# Patient Record
Sex: Male | Born: 1955 | Race: White | Hispanic: No | Marital: Married | Smoking: Never smoker
Health system: Southern US, Community
[De-identification: ages and names within clinical notes are randomized; demographics above are authoritative.]

## PROBLEM LIST (undated history)

## (undated) DIAGNOSIS — E559 Vitamin D deficiency, unspecified: Secondary | ICD-10-CM

## (undated) DIAGNOSIS — R011 Cardiac murmur, unspecified: Secondary | ICD-10-CM

## (undated) DIAGNOSIS — K589 Irritable bowel syndrome without diarrhea: Secondary | ICD-10-CM

## (undated) DIAGNOSIS — M6281 Muscle weakness (generalized): Secondary | ICD-10-CM

## (undated) DIAGNOSIS — Z8659 Personal history of other mental and behavioral disorders: Secondary | ICD-10-CM

## (undated) DIAGNOSIS — J189 Pneumonia, unspecified organism: Secondary | ICD-10-CM

## (undated) DIAGNOSIS — M549 Dorsalgia, unspecified: Secondary | ICD-10-CM

## (undated) DIAGNOSIS — I499 Cardiac arrhythmia, unspecified: Secondary | ICD-10-CM

## (undated) DIAGNOSIS — I240 Acute coronary thrombosis not resulting in myocardial infarction: Secondary | ICD-10-CM

## (undated) DIAGNOSIS — K219 Gastro-esophageal reflux disease without esophagitis: Secondary | ICD-10-CM

## (undated) DIAGNOSIS — E785 Hyperlipidemia, unspecified: Secondary | ICD-10-CM

## (undated) DIAGNOSIS — R0602 Shortness of breath: Secondary | ICD-10-CM

## (undated) DIAGNOSIS — Z8719 Personal history of other diseases of the digestive system: Secondary | ICD-10-CM

## (undated) DIAGNOSIS — R5383 Other fatigue: Secondary | ICD-10-CM

## (undated) DIAGNOSIS — M1990 Unspecified osteoarthritis, unspecified site: Secondary | ICD-10-CM

## (undated) DIAGNOSIS — M2550 Pain in unspecified joint: Secondary | ICD-10-CM

## (undated) DIAGNOSIS — C61 Malignant neoplasm of prostate: Secondary | ICD-10-CM

## (undated) DIAGNOSIS — H9190 Unspecified hearing loss, unspecified ear: Secondary | ICD-10-CM

## (undated) DIAGNOSIS — I10 Essential (primary) hypertension: Secondary | ICD-10-CM

## (undated) DIAGNOSIS — F419 Anxiety disorder, unspecified: Secondary | ICD-10-CM

## (undated) DIAGNOSIS — K5900 Constipation, unspecified: Secondary | ICD-10-CM

## (undated) DIAGNOSIS — R399 Unspecified symptoms and signs involving the genitourinary system: Secondary | ICD-10-CM

## (undated) DIAGNOSIS — D649 Anemia, unspecified: Secondary | ICD-10-CM

## (undated) HISTORY — DX: Shortness of breath: R06.02

## (undated) HISTORY — PX: HERNIA REPAIR: SHX51

## (undated) HISTORY — PX: HEMORROIDECTOMY: SUR656

## (undated) HISTORY — DX: Unspecified hearing loss, unspecified ear: H91.90

## (undated) HISTORY — PX: VASECTOMY: SHX75

## (undated) HISTORY — PX: OTHER SURGICAL HISTORY: SHX169

## (undated) HISTORY — DX: Vitamin D deficiency, unspecified: E55.9

## (undated) HISTORY — DX: Dorsalgia, unspecified: M54.9

## (undated) HISTORY — PX: CYST EXCISION: SHX5701

## (undated) HISTORY — DX: Constipation, unspecified: K59.00

## (undated) HISTORY — DX: Other fatigue: R53.83

## (undated) HISTORY — DX: Pain in unspecified joint: M25.50

## (undated) HISTORY — PX: LASIK: SHX215

## (undated) HISTORY — DX: Irritable bowel syndrome, unspecified: K58.9

## (undated) HISTORY — PX: SKIN TAG REMOVAL: SHX780

## (undated) HISTORY — DX: Acute coronary thrombosis not resulting in myocardial infarction: I24.0

## (undated) HISTORY — DX: Muscle weakness (generalized): M62.81

---

## 1999-06-07 ENCOUNTER — Encounter: Payer: Self-pay | Admitting: *Deleted

## 1999-06-07 ENCOUNTER — Ambulatory Visit (HOSPITAL_COMMUNITY): Admission: RE | Admit: 1999-06-07 | Discharge: 1999-06-07 | Payer: Self-pay | Admitting: *Deleted

## 2000-07-22 ENCOUNTER — Encounter: Payer: Self-pay | Admitting: Urology

## 2000-07-26 ENCOUNTER — Encounter (INDEPENDENT_AMBULATORY_CARE_PROVIDER_SITE_OTHER): Payer: Self-pay

## 2000-07-26 ENCOUNTER — Ambulatory Visit (HOSPITAL_COMMUNITY): Admission: RE | Admit: 2000-07-26 | Discharge: 2000-07-26 | Payer: Self-pay | Admitting: Urology

## 2001-07-22 ENCOUNTER — Encounter: Payer: Self-pay | Admitting: Internal Medicine

## 2001-07-22 ENCOUNTER — Encounter: Admission: RE | Admit: 2001-07-22 | Discharge: 2001-07-22 | Payer: Self-pay | Admitting: Internal Medicine

## 2005-06-26 ENCOUNTER — Ambulatory Visit (HOSPITAL_BASED_OUTPATIENT_CLINIC_OR_DEPARTMENT_OTHER): Admission: RE | Admit: 2005-06-26 | Discharge: 2005-06-26 | Payer: Self-pay | Admitting: Urology

## 2005-06-26 ENCOUNTER — Encounter (INDEPENDENT_AMBULATORY_CARE_PROVIDER_SITE_OTHER): Payer: Self-pay | Admitting: Specialist

## 2005-07-10 ENCOUNTER — Ambulatory Visit (HOSPITAL_COMMUNITY): Admission: AD | Admit: 2005-07-10 | Discharge: 2005-07-10 | Payer: Self-pay | Admitting: Urology

## 2009-05-05 ENCOUNTER — Emergency Department (HOSPITAL_BASED_OUTPATIENT_CLINIC_OR_DEPARTMENT_OTHER): Admission: EM | Admit: 2009-05-05 | Discharge: 2009-05-05 | Payer: Self-pay | Admitting: Emergency Medicine

## 2009-05-05 ENCOUNTER — Ambulatory Visit: Payer: Self-pay | Admitting: Diagnostic Radiology

## 2010-06-06 LAB — COMPREHENSIVE METABOLIC PANEL
ALT: 63 U/L — ABNORMAL HIGH (ref 0–53)
BUN: 45 mg/dL — ABNORMAL HIGH (ref 6–23)
CO2: 29 mEq/L (ref 19–32)
Calcium: 10.2 mg/dL (ref 8.4–10.5)
Creatinine, Ser: 1.9 mg/dL — ABNORMAL HIGH (ref 0.4–1.5)
GFR calc non Af Amer: 37 mL/min — ABNORMAL LOW (ref >60)
Glucose, Bld: 110 mg/dL — ABNORMAL HIGH (ref 70–99)

## 2010-06-06 LAB — URINALYSIS, ROUTINE W REFLEX MICROSCOPIC
Ketones, ur: 15 mg/dL — AB
Nitrite: NEGATIVE
pH: 5.5 (ref 5.0–8.0)

## 2010-06-06 LAB — CBC
HCT: 47.2 % (ref 39.0–52.0)
Hemoglobin: 16 g/dL (ref 13.0–17.0)
MCHC: 34 g/dL (ref 30.0–36.0)
MCV: 88.5 fL (ref 78.0–100.0)
Platelets: 200 10*3/uL (ref 150–400)
RBC: 5.34 MIL/uL (ref 4.22–5.81)
RDW: 12.6 % (ref 11.5–15.5)
WBC: 7.2 10*3/uL (ref 4.0–10.5)

## 2010-06-06 LAB — POCT CARDIAC MARKERS
CKMB, poc: 3.7 ng/mL (ref 1.0–8.0)
CKMB, poc: 5.5 ng/mL (ref 1.0–8.0)
Troponin i, poc: <0.05 ng/mL (ref 0.00–0.09)

## 2010-06-06 LAB — DIFFERENTIAL
Eosinophils Absolute: 0.1 10*3/uL (ref 0.0–0.7)
Lymphocytes Relative: 27 % (ref 12–46)
Lymphs Abs: 1.9 10*3/uL (ref 0.7–4.0)
Neutro Abs: 4.2 10*3/uL (ref 1.7–7.7)
Neutrophils Relative %: 59 % (ref 43–77)

## 2010-06-06 LAB — D-DIMER, QUANTITATIVE: D-Dimer, Quant: <0.22 ug/mL-FEU (ref 0.00–0.48)

## 2010-08-01 NOTE — Op Note (Signed)
NAME:  Thomas [name], Thomas [name]               ACCOUNT NO.:  [id number]   MEDICAL RECORD NO.:  [id number]          PATIENT TYPE:  AMB   LOCATION:  DAY                          FACILITY:  [facility]   PHYSICIAN:  Sigmund I. Patsi Sears, M.D.DATE OF BIRTH:  May 21, 1955   DATE OF PROCEDURE:  07/10/2005  DATE OF DISCHARGE:                                 OPERATIVE REPORT   PREOPERATIVE DIAGNOSIS:  Left scrotal hematoma.   POSTOPERATIVE DIAGNOSIS:  Left scrotal hematoma.   OPERATION:  Incision and drainage of left scrotal hematoma.   SURGEON:  Sigmund I. Patsi Sears, M.D.   ANESTHESIA:  General LMA.   PREPARATION:  Appropriate preanesthesia, the patient is brought to the  operating room and placed on the operating room table in the dorsal supine  position where general LMA anesthesia was induced.  He was then replaced in  the dorsal lithotomy position where the pubis was prepped with Betadine  solution and draped in the usual fashion.   REVIEW OF THE HISTORY:  This 55 year old male is status post left scrotal  expiration, excision of multiloculated left epididymal cyst, with postop of  scrotal hematoma.  The hematoma has begun to drain through the incision site  and is quite uncomfortable.  I recommended incision and drainage of the  scrotal hematoma today.   PROCEDURE:  The previous left scrotal incision is reopened, subcutaneous  tissue dissected, and a large amount of old blood clot is evacuated from the  wound.  The wound is irrigated with approximately one liter of saline  solution in a pulsating fashion, and following complete evacuation of  hematoma, the testicle was replaced in the wound, and a 3/4 inch Penrose  drain is placed through the inferior portion of scrotum.  The wound was  closed in one layer with 3-0 Vicryl suture.  A scrotal fluff dressing was  placed.  The patient received IV Ancef.  He is awakened and taken to the  recovery room in good condition.      Sigmund I.  Patsi Sears, M.D.  Electronically Signed     SIT/MEDQ  D:  07/10/2005  T:  07/11/2005  Job:  [id number]

## 2010-08-01 NOTE — Op Note (Signed)
NAME:  Thomas [name], Thomas [name]               ACCOUNT NO.:  [id number]   MEDICAL RECORD NO.:  [id number]          PATIENT TYPE:  AMB   LOCATION:  [facility]                         FACILITY:  [facility]   PHYSICIAN:  Sigmund I. Patsi Sears, M.D.DATE OF BIRTH:  [birth year]   DATE OF PROCEDURE:  06/26/2005  DATE OF DISCHARGE:                                 OPERATIVE REPORT   PREOPERATIVE DIAGNOSIS:  Left spermatocele.   POSTOPERATIVE DIAGNOSIS:  Left spermatocele.   OPERATION:  Left scrotal exploration, excision of large, multiloculated  epididymal cyst (spermatocele).   SURGEON:  Sigmund I. Patsi Sears, M.D.   ANESTHESIA:  General LMA.   PREPARATION:  After appropriate anesthesia, the patient was brought to the  operating room, placed on the operating table in dorsal supine position  where the scrotum was shaved, prepped with Betadine solution, draped in the  usual fashion.  The left inner thigh was previously marked twice to denote  the proper side.   PROCEDURE:  The previous left hemiscrotal incision was identified and  incised.  Subcutaneous tissue was dissected with the electrosurgical unit.  The testicle was delivered into the wound, and careful blunt and sharp  dissection was accomplished, in order to avoid injury to the vascular supply  to the testicle.  A large multiloculated cyst was identified, and this was  carefully dissected.  It was removed completely and drainage of the cyst was  accomplished at the very end of the dissection yielding approximately 50-75  mL of clear straw-colored fluid.  There was no mass within the cystic area.  The wound was inspected for bleeding points, the testicle re-delivered into  the wound, and a drain placed through the inferior portion of the left  hemiscrotum, and sutured in place with a 4-0 Monocryl suture.  The testicle  was delivered into the wound, and the wound closed in two layers with 3-0  Vicryl suture.  The wound edges were anesthetized with 0.25%  Marcaine  solution plain.  The patient was awakened, taken to recovery room in good  condition.  He received IV antibiotics in the procedure.      Sigmund I. Patsi Sears, M.D.  Electronically Signed     SIT/MEDQ  D:  06/26/2005  T:  06/26/2005  Job:  [id number]

## 2010-08-01 NOTE — Op Note (Signed)
[facility]  Patient:    Thomas [name], Thomas [name]                      MRN: [id number] Proc. Date: 07/26/00 Adm. Date:  [id number] Attending:  Laqueta Jean                           Operative Report  PREOPERATIVE DIAGNOSIS.  Left epididymal cyst, bilateral elective sterilization.  POSTOPERATIVE DIAGNOSIS:  Left epididymal cyst, bilateral elective sterilization.  OPERATION:  Left epididymal cyst excision, partial epididymectomy, bilateral vasectomy.  SURGEON:  Sigmund I. Patsi Sears, M.D.  ANESTHESIA:  General (LMA).  PREPARATION:  After appropriate preanesthesia, the patient was brought to the operating room, placed on the operating table in the dorsal supine position where general LMA anesthesia was introduced.  He remained in this position where the scrotum was washed with Betadine solution, then shaven, prepped with Betadine solution, and draped in the usual fashion.  DESCRIPTION OF PROCEDURE:  A left hemiscrotal incision was made in the horizontal position, measuring 3-4 cm.  Subcutaneous tissue was dissected with the electrosurgical unit.  The testicle was delivered in the wound, and the appendix epididymis and the appendix testis are removed with the electrosurgical unit.  A left epidermal cyst is identified, arising from the caput of the epididymis.  This required dissection with the electrosurgical unit and partial amputation of the epididymis, ligated with 2-0 Vicryl suture. This was oversewn with 3-0 Vicryl suture.  Vasectomy was accomplished by excising a portion of the vas, cauterizing both sides, ligating with 3-0 Vicryl suture bilaterally.  This is oversewn as well with 3-0 Vicryl suture. The testicle is delivered in the wound after injecting the spermatic cord with 0.25% plain Marcaine.  The skin edges were also injected with 0.25% plain Marcaine.  The subcutaneous tissue was closed with running 3-0 Vicryl suture, and the skin was  closed with 4-0 Vicryl suture.  The right-sided vas was then identified subcutaneously, and a 1 cm incision was made over the vas and subcutaneous tissue dissected with the electrosurgical unit.  The vas is delivered in the wound and clamped and amputated as well.  The ends are cauterized, and each end is ligated with 3-0 Vicryl suture.  The vas is oversewn with 3-0 Vicryl suture, and the subcutaneous tissue was closed with 3-0 Vicryl suture.  The area of the incision was injected with 0.25% plain Marcaine, as well as the ends of the vas.  Following this, the skin was closed with three separate 4-0 Vicryl sutures. Sterile dressing was applied.  The patient was given IV Toradol, B&O suppository, Zofran to combat nausea, awakened, and taken to the recovery room in good condition. DD:  07/26/00 TD:  07/26/00 Job: [id number]

## 2011-11-02 ENCOUNTER — Emergency Department (HOSPITAL_COMMUNITY)
Admission: EM | Admit: 2011-11-02 | Discharge: 2011-11-03 | Disposition: A | Discharge status: Home / Self Care | Payer: No Typology Code available for payment source | Attending: Emergency Medicine | Admitting: Emergency Medicine

## 2011-11-02 ENCOUNTER — Encounter (HOSPITAL_COMMUNITY): Payer: Self-pay | Admitting: *Deleted

## 2011-11-02 ENCOUNTER — Emergency Department (HOSPITAL_COMMUNITY): Payer: No Typology Code available for payment source

## 2011-11-02 DIAGNOSIS — R10819 Abdominal tenderness, unspecified site: Secondary | ICD-10-CM

## 2011-11-02 DIAGNOSIS — Y998 Other external cause status: Secondary | ICD-10-CM | POA: Insufficient documentation

## 2011-11-02 DIAGNOSIS — I10 Essential (primary) hypertension: Secondary | ICD-10-CM | POA: Insufficient documentation

## 2011-11-02 DIAGNOSIS — Y93I9 Activity, other involving external motion: Secondary | ICD-10-CM | POA: Insufficient documentation

## 2011-11-02 DIAGNOSIS — R51 Headache: Secondary | ICD-10-CM | POA: Insufficient documentation

## 2011-11-02 DIAGNOSIS — S139XXA Sprain of joints and ligaments of unspecified parts of neck, initial encounter: Secondary | ICD-10-CM | POA: Insufficient documentation

## 2011-11-02 DIAGNOSIS — S161XXA Strain of muscle, fascia and tendon at neck level, initial encounter: Secondary | ICD-10-CM

## 2011-11-02 HISTORY — DX: Essential (primary) hypertension: I10
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
## 2011-11-03 MED ORDER — CYCLOBENZAPRINE HCL 10 MG PO TABS
10.0000 mg | ORAL_TABLET | Freq: Once | ORAL | Status: AC
  Administered 2011-11-03: 10 mg via ORAL
  Filled 2011-11-03: qty 1

## 2011-11-03 MED ORDER — CYCLOBENZAPRINE HCL 10 MG PO TABS: 10.0000 mg | ORAL_TABLET | Freq: Two times a day (BID) | ORAL | Status: AC | PRN
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
## 2011-11-03 NOTE — ED Provider Notes (Signed)
Medical screening examination/treatment/procedure(s) were performed by non-physician practitioner and as supervising physician I was immediately available for consultation/collaboration.  Brighid Koch M Yolander Goodie, MD 11/03/11 0848 

## 2014-03-22 DIAGNOSIS — C61 Malignant neoplasm of prostate: Secondary | ICD-10-CM

## 2014-03-22 HISTORY — PX: PROSTATE BIOPSY: SHX241

## 2014-03-22 HISTORY — DX: Malignant neoplasm of prostate: C61

## 2014-04-10 ENCOUNTER — Ambulatory Visit (INDEPENDENT_AMBULATORY_CARE_PROVIDER_SITE_OTHER): Payer: BLUE CROSS/BLUE SHIELD

## 2014-04-10 ENCOUNTER — Ambulatory Visit: Payer: BLUE CROSS/BLUE SHIELD | Admitting: Podiatry

## 2014-04-10 VITALS — BP 136/80 | HR 64 | Resp 12

## 2014-04-10 DIAGNOSIS — R52 Pain, unspecified: Secondary | ICD-10-CM

## 2014-04-10 DIAGNOSIS — M109 Gout, unspecified: Secondary | ICD-10-CM

## 2014-04-10 DIAGNOSIS — M7751 Other enthesopathy of right foot: Secondary | ICD-10-CM

## 2014-04-10 DIAGNOSIS — M1000 Idiopathic gout, unspecified site: Secondary | ICD-10-CM

## 2014-04-10 MED ORDER — MELOXICAM 15 MG PO TABS: 15.0000 mg | ORAL_TABLET | Freq: Every day | ORAL | Status: DC

## 2014-04-10 NOTE — Progress Notes (Signed)
   Subjective:    Patient ID: Thomas [name], male    DOB: November 30, 1955, 59 y.o.   MRN: [id number]  HPI PT STATED RT GREAT TOE HAVE A KNOT AND BEEN HURTING FOR 2 MONTHS. THE TOE IS GETTING WORSE AND THE KNOT IS GETTING BIGGER.THE TOE GET AGGRAVATED BY FLEXING AND TRIED NO TREATMENT.   Review of Systems  All other systems reviewed and are negative.      Objective:   Physical Exam Lower extremity objective findings as follows is a 59 year old white male well-developed well-nourished oriented 3. Recently diagnosed with prostate cancer otherwise in no significant distress other than pain for the last couple of weeks in his right great toe distal IP joint. There is some prominence of the IP joint of the medial and dorsal no history of injury or trauma pain on palpation and attempted range of motion although is gotten better and the last week or so. Lower extremity objective findings reveal vascular status to be intact pedal pulses are palpable DP and PT +2 over 4 bilateral Refill time 3 seconds all digits epicritic and proprioceptive sensations intact and symmetric bilateral there is normal plantar response DTRs not listed dermatologic the skin color pigment normal hair growth absent nails somewhat criptotic and incurvated otherwise unremarkable orthopedic exam rectus foot type ankle metatarsal subtalar joint motions normal hallux IP joints somewhat prominent on the right as opposed to the left x-rays reveal some soft tissue swelling possibly some mild erosive changes of the middle proximal phalanx head or medial proximal phalanx head on the right great toe. Again no history of injury trauma the case scenario is red painful tender to touch although somewhat improved still slightly tender on palpation and range of motion. Patient is on a thiazide diuretic which can elicit gout attacks.       Assessment & Plan:  Assessment capsulitis first toe for right great toe IP joint cannot rule out gouty  arthropathy which appears to be resolving. Plan at this time patient placed on a trial of meloxicam 15 g once daily also alternating warm compress ice pack to the area every evening maintain appropriate accommodative shoe of void anything tight or constrictive. Monitor recheck in 2-4 weeks if not resolved have some lab work done in the near future by his primary physician he is being tested because of his cancer suggested that they check his uric acid levels as well he will forward any information regarding findings to [place]. At this time monitor recheck in 2-4 weeks if not resolved or improved may consider other alternative treatments based on future findings or changes. Advised to keep. Warm good athletic or walking shoes and socks at all times patient however does like to go barefoot which I discouraged. Next  Harriet Masson DPM

## 2014-04-10 NOTE — Patient Instructions (Signed)

## 2014-04-12 ENCOUNTER — Other Ambulatory Visit: Payer: Self-pay | Admitting: Internal Medicine

## 2014-04-12 DIAGNOSIS — M5416 Radiculopathy, lumbar region: Secondary | ICD-10-CM

## 2014-04-16 ENCOUNTER — Encounter: Payer: Self-pay | Admitting: Radiation Oncology

## 2014-04-16 NOTE — Progress Notes (Signed)
GU Location of Tumor / Histology: Prostate, Adenocarcinoma   Thomas [name] was found to have an elevated PSA of 2.87 while on 5 alpha reductase inhibitor therapy.   Biopsies of Prostate (if applicable) revealed: Adenocarcinoma of the Prostate  If Prostate Cancer, Gleason Score is (3 + 4) and PSA is (2.87), IPSS 18, SHIM 19, Volumr =53.9cc  Biopsies of Prostate (if applicable) revealed: Adenocarcinoma of the Prostate    Biopsies of Prostate (if applicable) revealed: Adenocarcinoma of the Prostate  Past/Anticipated interventions by urology, if any: Raynelle Bring - Biopsy of the Prostate  Past/Anticipated interventions by medical oncology, if any: Unknown  Weight changes, if any:no  Bowel/Bladder complaints, if any: Lower Urinary Tract Symptoms (LUTS) - Finasteride and Tamsulosin, no hematuria , nocturia x 3, urgency, good stream   Nausea/Vomiting, if any: no  Pain issues, if any: lower  back pain,   SAFETY ISSUES:  Prior radiation? No  Pacemaker/ICD? No  Possible current pregnancy? N/A  Is the patient on methotrexate? No  Current Complaints / other details:  Elevated PSA of 2.87 while on Alpha reductase inhibitor therapy, "mild" erectile dysfunction Father hx prostate cancer now bone L-5, mother deceased 2014-06-26, Ovarian cancer June 25, 1961, Lung cancer June 26, 2010

## 2014-04-17 ENCOUNTER — Ambulatory Visit
Admission: RE | Admit: 2014-04-17 | Discharge: 2014-04-17 | Disposition: A | Discharge status: Home / Self Care | Payer: BLUE CROSS/BLUE SHIELD | Source: Ambulatory Visit | Attending: Radiation Oncology | Admitting: Radiation Oncology

## 2014-04-17 ENCOUNTER — Encounter: Payer: Self-pay | Admitting: Radiation Oncology

## 2014-04-17 VITALS — BP 140/84 | HR 58 | Temp 97.8°F | Resp 20 | Ht 71.0 in | Wt 297.4 lb

## 2014-04-17 DIAGNOSIS — F419 Anxiety disorder, unspecified: Secondary | ICD-10-CM | POA: Insufficient documentation

## 2014-04-17 DIAGNOSIS — Z8042 Family history of malignant neoplasm of prostate: Secondary | ICD-10-CM | POA: Insufficient documentation

## 2014-04-17 DIAGNOSIS — Z791 Long term (current) use of non-steroidal anti-inflammatories (NSAID): Secondary | ICD-10-CM | POA: Diagnosis not present

## 2014-04-17 DIAGNOSIS — I10 Essential (primary) hypertension: Secondary | ICD-10-CM | POA: Insufficient documentation

## 2014-04-17 DIAGNOSIS — C61 Malignant neoplasm of prostate: Secondary | ICD-10-CM | POA: Insufficient documentation

## 2014-04-17 HISTORY — DX: Malignant neoplasm of prostate: C61

## 2014-04-17 HISTORY — DX: Unspecified symptoms and signs involving the genitourinary system: R39.9

## 2014-04-17 HISTORY — DX: Personal history of other mental and behavioral disorders: Z86.59

## 2014-04-17 HISTORY — DX: Hyperlipidemia, unspecified: E78.5

## 2014-04-17 NOTE — Progress Notes (Signed)
Please see the Nurse Progress Note in the MD Initial Consult Encounter for this patient. 

## 2014-04-17 NOTE — Progress Notes (Signed)
[facility] NEW PATIENT EVALUATION  Name: OREL [name] MRN: [id number]  Date:   04/17/2014           DOB: 1956/01/24  Status: outpatient   CC: Henrine Screws, MD  Dr. Dutch Gray   REFERRING PHYSICIAN: Dr. 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 male who is seen today through the courtesy of Dr. Alinda Money for evaluation of his stage TIc intermediate risk adenocarcinoma prostate.  He was noted to have an elevated PSA of 2.87 while on finasteride.  He was sent to Dr. Alinda Money for further evaluation.  He underwent ultrasound-guided biopsies on 03/22/2014.  He was found have Gleason 7 (3+4) involving 10% of one core from the right base (pattern 4 =10%) and Gleason 6 (3+3) involving 60% of one core from the right lateral base.  His gland volume was approximately 54 mL.  He's had difficulty with obstructive symptomatology and his I PSS score was 18 while on finasteride and tamsulosin.  He was given samples of Rapaflo which did improve his obstructive symptoms to a noticeable degree.  His  IPSS score today is 11.  He is potent.  No GI difficulties.  PREVIOUS RADIATION THERAPY: No   PAST MEDICAL HISTORY:  has a past medical history of Hypertension; History of anxiety; Hyperlipemia; Lower urinary tract symptoms (LUTS); and Prostate cancer (03/22/14).     PAST SURGICAL HISTORY:  Past Surgical History  Procedure Laterality Date  . Hernia repair Bilateral     open inguinal hernia repairs  . Prostate biopsy  03/22/14    Raynelle [name]  . Hemorroidectomy    . Incision and drainage of wound      Wound Infection Post OP  . Skin tag removal      15 lesions  . Epididymis excision Left     Spermocele  . Vasectomy       FAMILY HISTORY: family history includes COPD in his mother; Cancer in his mother; Heart attack in his mother; Heart failure in his mother; Ovarian cancer in  his mother. His father was diagnosed with prostate cancer at age 17.  His mother died from kidney failure but had a history of early stage lung cancer and also ovarian cancer.   SOCIAL HISTORY:  reports that he has never smoked. He does not have any smokeless tobacco history on file. He reports that he does not drink alcohol or use illicit drugs.  Married, 2 children.  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   ALLERGIES: Review of patient's allergies indicates no known allergies.   MEDICATIONS:  Current Outpatient Prescriptions  Medication Sig Dispense Refill  . Cholecalciferol (VITAMIN D3) 2000 UNITS TABS Take 2,000 Units by mouth daily.    Marland Kitchen escitalopram (LEXAPRO) 10 MG tablet Take 10 mg by mouth daily.  5  . finasteride (PROSCAR) 5 MG tablet Take 5 mg by mouth daily.  2  . meloxicam (MOBIC) 15 MG tablet Take 1 tablet (15 mg total) by mouth daily. 30 tablet 1  . methocarbamol (ROBAXIN) 500 MG tablet Take 500 mg by mouth every 8 (eight) hours as needed for muscle spasms.    . Tamsulosin HCl (FLOMAX) 0.4 MG CAPS Take 0.4 mg by mouth daily.    . valsartan-hydrochlorothiazide (DIOVAN-HCT) 320-25 MG per tablet Take 1 tablet by mouth daily.  12  . Multiple Vitamin (MULTIVITAMIN WITH MINERALS) TABS Take  1 tablet by mouth daily.     No current facility-administered medications for this encounter.     REVIEW OF SYSTEMS:  Pertinent items are noted in HPI.    PHYSICAL EXAM:  height is 5\' 11"  (1.803 m) and weight is 297 lb 6.4 oz (134.9 kg). His oral temperature is 97.8 F (36.6 C). His blood pressure is 140/84 and his pulse is 58. His respiration is 20.   Alert and oriented 59 year old white male appearing his stated age.  He is not examined today.   LABORATORY DATA:  Lab Results  Component Value Date   WBC 7.2 05/05/2009   HGB 16.0 05/05/2009   HCT 47.2 05/05/2009   MCV 88.5 05/05/2009   PLT 200 05/05/2009   Lab Results  Component Value Date   NA 138 05/05/2009   K 3.4*  05/05/2009   CL 98 05/05/2009   CO2 29 05/05/2009   Lab Results  Component Value Date   ALT 63* 05/05/2009   AST 53* 05/05/2009   ALKPHOS 90 05/05/2009   BILITOT 1.0 05/05/2009   PSA 2.87 (corrected 5.8) from 02/21/2014   IMPRESSION: Stage TIc reviewed risk adenocarcinoma prostate.  I explained to the patient and his wife that his prognosis is related to his stage, Gleason score, and PSA level.  His stage and PSA level are favorable while his Gleason score of 7 is of intermediate favorability.  Other prognostic factors include PSA doubling time and disease volume, and these appear to be favorable.  We discussed surgery versus active surveillance versus radiation therapy.  In view of his age I do not recommend active surveillance with a Gleason 7.  Radiation therapy options include seed implantation with or without 5 weeks of external beam versus 8 weeks of external beam/IMRT.  We discussed the potential acute and late toxicities of both forms of radiation therapy.  Considering his obstructive symptomatology he would not be a candidate for seed implantation with or without external beam radiation therapy.  Even with external beam/IMRT, his urinary symptomatology can only worsen, and he may actually have better urinary function following surgery.  Because of his young age and obstructive urinary symptomatology, I strongly recommend robotic surgery as the treatment of choice for him.  He chooses robotic surgery.   PLAN: As discussed above.   I spent 60 minutes minutes face to face with the patient and more than 50% of that time was spent in counseling and/or coordination of care.

## 2014-04-25 ENCOUNTER — Ambulatory Visit
Admission: RE | Admit: 2014-04-25 | Discharge: 2014-04-25 | Disposition: A | Discharge status: Home / Self Care | Payer: BLUE CROSS/BLUE SHIELD | Source: Ambulatory Visit | Attending: Internal Medicine | Admitting: Internal Medicine

## 2014-04-25 DIAGNOSIS — M5416 Radiculopathy, lumbar region: Secondary | ICD-10-CM

## 2014-05-01 ENCOUNTER — Other Ambulatory Visit: Payer: Self-pay | Admitting: Urology

## 2014-05-02 NOTE — Patient Instructions (Addendum)
Thomas [name]  05/02/2014   Your procedure is scheduled on:05/17/2014   Report to [facility] Main  Entrance and follow signs to               [place] at      [place] AM.  Call this number if you have problems the morning of surgery [phone]   Remember:  Do not eat food or drink liquids :After Midnight.     Take these medicines the morning of surgery with A SIP OF WATER: none                                You may not have any metal on your body including hair pins and              piercings  Do not wear jewelry,  lotions, powders or perfumes., deodorant.                     Men may shave face and neck.   Do not bring valuables to the hospital. [place].  Contacts, dentures or bridgework may not be worn into surgery.  Leave suitcase in the car. After surgery it may be brought to your room.        Special Instructions:coughing and deep breathing exercises, leg exercises               Please read over the following fact sheets you were given: _____________________________________________________________________             [facility] - Preparing for Surgery Before surgery, you can play an important role.  Because skin is not sterile, your skin needs to be as free of germs as possible.  You can reduce the number of germs on your skin by washing with CHG (chlorahexidine gluconate) soap before surgery.  CHG is an antiseptic cleaner which kills germs and bonds with the skin to continue killing germs even after washing. Please DO NOT use if you have an allergy to CHG or antibacterial soaps.  If your skin becomes reddened/irritated stop using the CHG and inform your nurse when you arrive at Short Stay. Do not shave (including legs and underarms) for at least 48 hours prior to the first CHG shower.  You may shave your face/neck. Please follow these instructions carefully:  1.  Shower with CHG Soap the  night before surgery and the  morning of Surgery.  2.  If you choose to wash your hair, wash your hair first as usual with your  normal  shampoo.  3.  After you shampoo, rinse your hair and body thoroughly to remove the  shampoo.                           4.  Use CHG as you would any other liquid soap.  You can apply chg directly  to the skin and wash                       Gently with a scrungie or clean washcloth.  5.  Apply the CHG Soap to your body ONLY FROM THE NECK DOWN.   Do not use on  face/ open                           Wound or open sores. Avoid contact with eyes, ears mouth and genitals (private parts).                       Wash face,  Genitals (private parts) with your normal soap.             6.  Wash thoroughly, paying special attention to the area where your surgery  will be performed.  7.  Thoroughly rinse your body with warm water from the neck down.  8.  DO NOT shower/wash with your normal soap after using and rinsing off  the CHG Soap.                9.  Pat yourself dry with a clean towel.            10.  Wear clean pajamas.            11.  Place clean sheets on your bed the night of your first shower and do not  sleep with pets. Day of Surgery : Do not apply any lotions/deodorants the morning of surgery.  Please wear clean clothes to the hospital/surgery center.  FAILURE TO FOLLOW THESE INSTRUCTIONS MAY RESULT IN THE CANCELLATION OF YOUR SURGERY PATIENT SIGNATURE_________________________________  NURSE SIGNATURE__________________________________  ________________________________________________________________________  WHAT IS A BLOOD TRANSFUSION? Blood Transfusion Information  A transfusion is the replacement of blood or some of its parts. Blood is made up of multiple cells which provide different functions.  Red blood cells carry oxygen and are used for blood loss replacement.  White blood cells fight against infection.  Platelets control bleeding.  Plasma  helps clot blood.  Other blood products are available for specialized needs, such as hemophilia or other clotting disorders. BEFORE THE TRANSFUSION  Who gives blood for transfusions?   Healthy volunteers who are fully evaluated to make sure their blood is safe. This is blood bank blood. Transfusion therapy is the safest it has ever been in the practice of medicine. Before blood is taken from a donor, a complete history is taken to make sure that person has no history of diseases nor engages in risky social behavior (examples are intravenous drug use or sexual activity with multiple partners). The donor's travel history is screened to minimize risk of transmitting infections, such as malaria. The donated blood is tested for signs of infectious diseases, such as HIV and hepatitis. The blood is then tested to be sure it is compatible with you in order to minimize the chance of a transfusion reaction. If you or a relative donates blood, this is often done in anticipation of surgery and is not appropriate for emergency situations. It takes many days to process the donated blood. RISKS AND COMPLICATIONS Although transfusion therapy is very safe and saves many lives, the main dangers of transfusion include:  1. Getting an infectious disease. 2. Developing a transfusion reaction. This is an allergic reaction to something in the blood you were given. Every precaution is taken to prevent this. The decision to have a blood transfusion has been considered carefully by your caregiver before blood is given. Blood is not given unless the benefits outweigh the risks. AFTER THE TRANSFUSION  Right after receiving a blood transfusion, you will usually feel much better and more energetic. This is especially true if your red  blood cells have gotten low (anemic). The transfusion raises the level of the red blood cells which carry oxygen, and this usually causes an energy increase.  The nurse administering the transfusion  will monitor you carefully for complications. HOME CARE INSTRUCTIONS  No special instructions are needed after a transfusion. You may find your energy is better. Speak with your caregiver about any limitations on activity for underlying diseases you may have. SEEK MEDICAL CARE IF:   Your condition is not improving after your transfusion.  You develop redness or irritation at the intravenous (IV) site. SEEK IMMEDIATE MEDICAL CARE IF:  Any of the following symptoms occur over the next 12 hours:  Shaking chills.  You have a temperature by mouth above 102 F (38.9 C), not controlled by medicine.  Chest, back, or muscle pain.  People around you feel you are not acting correctly or are confused.  Shortness of breath or difficulty breathing.  Dizziness and fainting.  You get a rash or develop hives.  You have a decrease in urine output.  Your urine turns a dark color or changes to pink, red, or brown. Any of the following symptoms occur over the next 10 days:  You have a temperature by mouth above 102 F (38.9 C), not controlled by medicine.  Shortness of breath.  Weakness after normal activity.  The white part of the eye turns yellow (jaundice).  You have a decrease in the amount of urine or are urinating less often.  Your urine turns a dark color or changes to pink, red, or brown. Document Released: 02/28/2000 Document Revised: 05/25/2011 Document Reviewed: 10/17/2007 ExitCare Patient Information 2014 [place].  _______________________________________________________________________  Incentive Spirometer  An incentive spirometer is a tool that can help keep your lungs clear and active. This tool measures how well you are filling your lungs with each breath. Taking long deep breaths may help reverse or decrease the chance of developing breathing (pulmonary) problems (especially infection) following:  A long period of time when you are unable to move or be  active. BEFORE THE PROCEDURE   If the spirometer includes an indicator to show your best effort, your nurse or respiratory therapist will set it to a desired goal.  If possible, sit up straight or lean slightly forward. Try not to slouch.  Hold the incentive spirometer in an upright position. INSTRUCTIONS FOR USE  3. Sit on the edge of your bed if possible, or sit up as far as you can in bed or on a chair. 4. Hold the incentive spirometer in an upright position. 5. Breathe out normally. 6. Place the mouthpiece in your mouth and seal your lips tightly around it. 7. Breathe in slowly and as deeply as possible, raising the piston or the ball toward the top of the column. 8. Hold your breath for 3-5 seconds or for as long as possible. Allow the piston or ball to fall to the bottom of the column. 9. Remove the mouthpiece from your mouth and breathe out normally. 10. Rest for a few seconds and repeat Steps 1 through 7 at least 10 times every 1-2 hours when you are awake. Take your time and take a few normal breaths between deep breaths. 11. The spirometer may include an indicator to show your best effort. Use the indicator as a goal to work toward during each repetition. 12. After each set of 10 deep breaths, practice coughing to be sure your lungs are clear. If you have an incision (the cut  made at the time of surgery), support your incision when coughing by placing a pillow or rolled up towels firmly against it. Once you are able to get out of bed, walk around indoors and cough well. You may stop using the incentive spirometer when instructed by your caregiver.  RISKS AND COMPLICATIONS  Take your time so you do not get dizzy or light-headed.  If you are in pain, you may need to take or ask for pain medication before doing incentive spirometry. It is harder to take a deep breath if you are having pain. AFTER USE  Rest and breathe slowly and easily.  It can be helpful to keep track of a log of  your progress. Your caregiver can provide you with a simple table to help with this. If you are using the spirometer at home, follow these instructions: [place] IF:   You are having difficultly using the spirometer.  You have trouble using the spirometer as often as instructed.  Your pain medication is not giving enough relief while using the spirometer.  You develop fever of 100.5 F (38.1 C) or higher. SEEK IMMEDIATE MEDICAL CARE IF:   You cough up bloody sputum that had not been present before.  You develop fever of 102 F (38.9 C) or greater.  You develop worsening pain at or near the incision site. MAKE SURE YOU:   Understand these instructions.  Will watch your condition.  Will get help right away if you are not doing well or get worse. Document Released: 07/13/2006 Document Revised: 05/25/2011 Document Reviewed: 09/13/2006 [facility] Patient Information 2014 [place], [place].   ________________________________________________________________________

## 2014-05-03 ENCOUNTER — Encounter (HOSPITAL_COMMUNITY)
Admission: RE | Admit: 2014-05-03 | Discharge: 2014-05-03 | Disposition: A | Discharge status: Home / Self Care | Payer: BLUE CROSS/BLUE SHIELD | Source: Ambulatory Visit | Attending: Urology | Admitting: Urology

## 2014-05-03 ENCOUNTER — Ambulatory Visit (HOSPITAL_COMMUNITY)
Admission: RE | Admit: 2014-05-03 | Discharge: 2014-05-03 | Disposition: A | Discharge status: Home / Self Care | Payer: BLUE CROSS/BLUE SHIELD | Source: Ambulatory Visit | Attending: Urology | Admitting: Urology

## 2014-05-03 ENCOUNTER — Encounter (HOSPITAL_COMMUNITY): Payer: Self-pay

## 2014-05-03 ENCOUNTER — Other Ambulatory Visit: Payer: Self-pay

## 2014-05-03 DIAGNOSIS — C61 Malignant neoplasm of prostate: Secondary | ICD-10-CM | POA: Diagnosis not present

## 2014-05-03 DIAGNOSIS — I10 Essential (primary) hypertension: Secondary | ICD-10-CM | POA: Insufficient documentation

## 2014-05-03 DIAGNOSIS — Z01818 Encounter for other preprocedural examination: Secondary | ICD-10-CM | POA: Diagnosis present

## 2014-05-03 HISTORY — DX: Pneumonia, unspecified organism: J18.9

## 2014-05-03 HISTORY — DX: Anemia, unspecified: D64.9

## 2014-05-03 HISTORY — DX: Anxiety disorder, unspecified: F41.9

## 2014-05-03 HISTORY — DX: Unspecified osteoarthritis, unspecified site: M19.90

## 2014-05-03 LAB — CBC
HEMATOCRIT: 48.7 % (ref 39.0–52.0)
Hemoglobin: 16 g/dL (ref 13.0–17.0)
MCH: 29.6 pg (ref 26.0–34.0)
MCHC: 32.9 g/dL (ref 30.0–36.0)
MCV: 90 fL (ref 78.0–100.0)
Platelets: 171 10*3/uL (ref 150–400)
RBC: 5.41 MIL/uL (ref 4.22–5.81)
RDW: 13 % (ref 11.5–15.5)
WBC: 5.8 10*3/uL (ref 4.0–10.5)

## 2014-05-03 LAB — BASIC METABOLIC PANEL
Anion gap: 6 (ref 5–15)
BUN: 19 mg/dL (ref 6–23)
CO2: 31 mmol/L (ref 19–32)
CREATININE: 1.07 mg/dL (ref 0.50–1.35)
Calcium: 9.7 mg/dL (ref 8.4–10.5)
Chloride: 104 mmol/L (ref 96–112)
GFR calc Af Amer: 87 mL/min — ABNORMAL LOW (ref >90)
GFR calc non Af Amer: 75 mL/min — ABNORMAL LOW (ref >90)
GLUCOSE: 99 mg/dL (ref 70–99)
POTASSIUM: 4.7 mmol/L (ref 3.5–5.1)
Sodium: 141 mmol/L (ref 135–145)

## 2014-05-16 MED ORDER — DEXTROSE 5 % IV SOLN
3.0000 g | INTRAVENOUS | Status: AC
  Administered 2014-05-17: 3 g via INTRAVENOUS
  Filled 2014-05-16 (×3): qty 3000

## 2014-05-16 NOTE — H&P (Signed)
History of Present Illness Thomas [name] is 59 years old with prostate cancer. He was found to have an elevated PSA of 2.87 while on 5 alpha reductase inhibitor therapy. He underwent a prostate biopsy on 03/22/14 and this demonstrated Gleason 3+4=7 adenocarcinoma with 2 out of 12 biopsy cores positive for malignancy. He has a paternal family history of prostate cancer. He is very healthy overall. His past surgical history is significant for bilateral open inguinal hernia repairs.    TNM stage: cT1c Nx Mx  PSA: 2.87 (on 5 ARI)  Gleason score: 3+4=7  Biopsy (03/22/14): 2/12 cores positive -- R base (10%, 3+4=7), R lateral base (60%, 3+3=6)  Prostate volume: 53.9 cc    Nomogram  OC disease: 64%  EPE: 36%  SVI: 1%  LNI: 1%  PFS (surgery): 95% at 5 years, 90% at 10 years    Urinary function: He does have significant LUTS. IPSS is 18 on combination medical therapy including finasteride and tamsulosin.  Erectile function: He has mild erectile dysfunction. SHIM score is 19.    Interval history:    Thomas [name] returns today after having seen Dr. Valere Dross for a radiation oncology consultation. He has decided to proceed with surgical therapy for treatment of his prostate cancer.   Past Medical History Problems  1. History of Anxiety (F41.9) 2. History of hyperlipidemia (Z86.39) 3. History of hypertension (Z86.79)  Surgical History Problems  1. History of Hemorrhoidectomy 2. History of Hernia Repair 3. History of Incision And Drainage Postoperative Wound Infection, Complex 4. History of Skin Tag Removal Up To 15 Lesions 5. History of Surgery Epididymis Excision Of Spermatocele Left 6. History of Surgery Of Male Genitalia Vasectomy  Current Meds 1. Diovan HCT 320-25 MG Oral Tablet;  Therapy: (Recorded:09Dec2015) to Recorded 2. Escitalopram Oxalate 10 MG Oral Tablet;  Therapy: (Recorded:09Dec2015) to Recorded 3. Finasteride 5 MG Oral Tablet;  Therapy:  (Recorded:09Dec2015) to Recorded 4. Rapaflo 8 MG Oral Capsule; TAKE 1 CAPSULE Bedtime;  Therapy: 26Jan2016 to (Evaluate:20Jan2017)  Requested for: 26Jan2016; Last  Rx:26Jan2016 Ordered 5. Tamsulosin HCl - 0.4 MG Oral Capsule;  Therapy: (Recorded:09Dec2015) to Recorded  Allergies Medication  1. No Known Drug Allergies  Family History Problems  1. Family history of Acute Myocardial Infarction : Mother 2. Family history of Cancer : Mother 3. Family history of Family Health Status Number Of Children   2 sons 4. Family history of chronic obstructive pulmonary disease (Z82.5) : Mother 5. Family history of lung cancer (Z80.1) : Mother 6. Family history of ovarian cancer (Z80.41) : Mother 7. Family history of prostate cancer (Z80.42) : Father 8. Family history of Hysterectomy : Mother 23. Family history of Knee Replacement : Father  Social History Problems  1. Denied: Alcohol Use 2. Marital History - Currently Married 3. Never a smoker 4. Occupation:   Press photographer 5. Denied: History of Tobacco Use  Vitals Vital Signs [Data Includes: Last 1 Day]  Recorded: [id number] 08:06AM  Blood Pressure: 117 / 74 Heart Rate: 80  Physical Exam Constitutional: Well nourished and well developed . No acute distress.  ENT:. The ears and nose are normal in appearance.  Neck: The appearance of the neck is normal and no neck mass is present.  Pulmonary: No respiratory distress, normal respiratory rhythm and effort and clear bilateral breath sounds.  Cardiovascular: Heart rate and rhythm are normal . No peripheral edema.  Abdomen: The abdomen is obese. The abdomen is soft and nontender. No CVA tenderness.  Neuro/Psych:. Mood and affect are appropriate.  Plan Prostate cancer  1. Follow-up Schedule Surgery Office  Follow-up  Status: Complete  Done: [id number] 2. PT/OT Referral Referral  Referral  Status: Hold For - Appointment,PreCert,Date of  Service,Physical Therapy  Requested for:  [id number]  Discussion/Summary 1. Prostate cancer: Thomas [name] feels very well informed after his discussion with Dr. Valere Dross and has elected to proceed with surgical therapy and will be scheduled for a bilateral nerve sparing robot-assisted laparoscopic radical prostatectomy and bilateral pelvic lymphadenectomy.   We discussed surgical therapy for prostate cancer including the different available surgical approaches. We discussed, in detail, the risks and expectations of surgery with regard to cancer control, urinary control, and erectile function as well as the expected postoperative recovery process. Additional risks of surgery including but not limited to bleeding, infection, hernia formation, nerve damage, lymphocele formation, bowel/rectal injury potentially necessitating colostomy, damage to the urinary tract resulting in urine leakage, urethral stricture, and the cardiopulmonary risks such as myocardial infarction, stroke, death, venothromboembolism, etc. were explained. The risk of open surgical conversion for robotic/laparoscopic prostatectomy was also discussed.     All questions were answered to his stated satisfaction. His surgery will be scheduled for the near future.    Cc: Dr. Arloa Koh  Dr. Mertha Finders  A total of 45 minutes were spent in the overall care of the patient today with 40 minutes in direct face to face consultation.    Signatures Electronically signed by : Raynelle Bring, M.D.; May 01 2014  8:52AM EST

## 2014-05-17 ENCOUNTER — Inpatient Hospital Stay (HOSPITAL_COMMUNITY): Payer: BLUE CROSS/BLUE SHIELD | Admitting: Anesthesiology

## 2014-05-17 ENCOUNTER — Inpatient Hospital Stay (HOSPITAL_COMMUNITY)
Admission: RE | Admit: 2014-05-17 | Discharge: 2014-05-18 | DRG: 708 | Disposition: A | Discharge status: Home / Self Care | Payer: BLUE CROSS/BLUE SHIELD | Source: Ambulatory Visit | Attending: Urology | Admitting: Urology

## 2014-05-17 ENCOUNTER — Encounter (HOSPITAL_COMMUNITY): Payer: Self-pay | Admitting: *Deleted

## 2014-05-17 ENCOUNTER — Encounter (HOSPITAL_COMMUNITY)
Admission: RE | Disposition: A | Discharge status: Home / Self Care | Payer: Self-pay | Source: Ambulatory Visit | Attending: Urology

## 2014-05-17 DIAGNOSIS — I10 Essential (primary) hypertension: Secondary | ICD-10-CM | POA: Diagnosis present

## 2014-05-17 DIAGNOSIS — Z8249 Family history of ischemic heart disease and other diseases of the circulatory system: Secondary | ICD-10-CM

## 2014-05-17 DIAGNOSIS — E785 Hyperlipidemia, unspecified: Secondary | ICD-10-CM | POA: Diagnosis present

## 2014-05-17 DIAGNOSIS — Z8041 Family history of malignant neoplasm of ovary: Secondary | ICD-10-CM | POA: Diagnosis not present

## 2014-05-17 DIAGNOSIS — Z8042 Family history of malignant neoplasm of prostate: Secondary | ICD-10-CM | POA: Diagnosis not present

## 2014-05-17 DIAGNOSIS — C61 Malignant neoplasm of prostate: Principal | ICD-10-CM | POA: Diagnosis present

## 2014-05-17 DIAGNOSIS — N529 Male erectile dysfunction, unspecified: Secondary | ICD-10-CM | POA: Diagnosis present

## 2014-05-17 DIAGNOSIS — Z801 Family history of malignant neoplasm of trachea, bronchus and lung: Secondary | ICD-10-CM | POA: Diagnosis not present

## 2014-05-17 HISTORY — PX: ROBOT ASSISTED LAPAROSCOPIC RADICAL PROSTATECTOMY: SHX5141

## 2014-05-17 HISTORY — PX: LYMPHADENECTOMY: SHX5960

## 2014-05-17 LAB — TYPE AND SCREEN
ABO/RH(D): O POS
Antibody Screen: NEGATIVE

## 2014-05-17 LAB — ABO/RH: ABO/RH(D): O POS

## 2014-05-17 LAB — HEMOGLOBIN AND HEMATOCRIT, BLOOD
HCT: 44.8 % (ref 39.0–52.0)
Hemoglobin: 14.9 g/dL (ref 13.0–17.0)

## 2014-05-17 SURGERY — ROBOTIC ASSISTED LAPAROSCOPIC RADICAL PROSTATECTOMY LEVEL 2
Anesthesia: General

## 2014-05-17 MED ORDER — DIPHENHYDRAMINE HCL 12.5 MG/5ML PO ELIX: 12.5000 mg | ORAL_SOLUTION | Freq: Four times a day (QID) | ORAL | Status: DC | PRN

## 2014-05-17 MED ORDER — DIPHENHYDRAMINE HCL 50 MG/ML IJ SOLN: 12.5000 mg | Freq: Four times a day (QID) | INTRAMUSCULAR | Status: DC | PRN

## 2014-05-17 MED ORDER — PROPOFOL 10 MG/ML IV BOLUS
INTRAVENOUS | Status: DC | PRN
Start: 2014-05-17 — End: 2014-05-17
  Administered 2014-05-17: 180 mg via INTRAVENOUS

## 2014-05-17 MED ORDER — DEXAMETHASONE SODIUM PHOSPHATE 10 MG/ML IJ SOLN
INTRAMUSCULAR | Status: DC | PRN
  Administered 2014-05-17: 10 mg via INTRAVENOUS

## 2014-05-17 MED ORDER — MORPHINE SULFATE 2 MG/ML IJ SOLN
2.0000 mg | INTRAMUSCULAR | Status: DC | PRN
  Administered 2014-05-17: 2 mg via INTRAVENOUS
  Filled 2014-05-17: qty 1

## 2014-05-17 MED ORDER — PROPOFOL 10 MG/ML IV BOLUS
INTRAVENOUS | Status: AC
  Filled 2014-05-17: qty 20

## 2014-05-17 MED ORDER — HYDROMORPHONE HCL 1 MG/ML IJ SOLN
0.2500 mg | INTRAMUSCULAR | Status: DC | PRN
  Administered 2014-05-17 (×2): 0.5 mg via INTRAVENOUS

## 2014-05-17 MED ORDER — HYDROCODONE-ACETAMINOPHEN 5-325 MG PO TABS: 1.0000 | ORAL_TABLET | Freq: Four times a day (QID) | ORAL | Status: DC | PRN

## 2014-05-17 MED ORDER — LACTATED RINGERS IV SOLN: INTRAVENOUS | Status: DC

## 2014-05-17 MED ORDER — SUCCINYLCHOLINE CHLORIDE 20 MG/ML IJ SOLN
INTRAMUSCULAR | Status: DC | PRN
  Administered 2014-05-17: 140 mg via INTRAVENOUS

## 2014-05-17 MED ORDER — BUPIVACAINE-EPINEPHRINE 0.25% -1:200000 IJ SOLN
INTRAMUSCULAR | Status: DC | PRN
  Administered 2014-05-17: 30 mL

## 2014-05-17 MED ORDER — CEFAZOLIN SODIUM 1-5 GM-% IV SOLN
1.0000 g | Freq: Three times a day (TID) | INTRAVENOUS | Status: AC
  Administered 2014-05-17 – 2014-05-18 (×2): 1 g via INTRAVENOUS
  Filled 2014-05-17 (×2): qty 50

## 2014-05-17 MED ORDER — ROCURONIUM BROMIDE 100 MG/10ML IV SOLN
INTRAVENOUS | Status: AC
  Filled 2014-05-17: qty 1

## 2014-05-17 MED ORDER — FENTANYL CITRATE 0.05 MG/ML IJ SOLN
INTRAMUSCULAR | Status: AC
  Filled 2014-05-17: qty 5

## 2014-05-17 MED ORDER — NEOSTIGMINE METHYLSULFATE 10 MG/10ML IV SOLN
INTRAVENOUS | Status: AC
  Filled 2014-05-17: qty 1

## 2014-05-17 MED ORDER — ROCURONIUM BROMIDE 100 MG/10ML IV SOLN
INTRAVENOUS | Status: DC | PRN
  Administered 2014-05-17: 10 mg via INTRAVENOUS
  Administered 2014-05-17: 35 mg via INTRAVENOUS
  Administered 2014-05-17: 20 mg via INTRAVENOUS
  Administered 2014-05-17 (×2): 10 mg via INTRAVENOUS
  Administered 2014-05-17: 5 mg via INTRAVENOUS

## 2014-05-17 MED ORDER — ONDANSETRON HCL 4 MG/2ML IJ SOLN
INTRAMUSCULAR | Status: AC
  Filled 2014-05-17: qty 2

## 2014-05-17 MED ORDER — NEOSTIGMINE METHYLSULFATE 10 MG/10ML IV SOLN
INTRAVENOUS | Status: DC | PRN
  Administered 2014-05-17: 4 mg via INTRAVENOUS

## 2014-05-17 MED ORDER — BUPIVACAINE-EPINEPHRINE (PF) 0.25% -1:200000 IJ SOLN
INTRAMUSCULAR | Status: AC
  Filled 2014-05-17: qty 30

## 2014-05-17 MED ORDER — ONDANSETRON HCL 4 MG/2ML IJ SOLN
INTRAMUSCULAR | Status: DC | PRN
  Administered 2014-05-17: 4 mg via INTRAVENOUS

## 2014-05-17 MED ORDER — LACTATED RINGERS IV SOLN
INTRAVENOUS | Status: DC | PRN
  Administered 2014-05-17: 1000 mL

## 2014-05-17 MED ORDER — KETOROLAC TROMETHAMINE 15 MG/ML IJ SOLN
15.0000 mg | Freq: Four times a day (QID) | INTRAMUSCULAR | Status: DC
  Administered 2014-05-17 – 2014-05-18 (×5): 15 mg via INTRAVENOUS
  Filled 2014-05-17 (×5): qty 1

## 2014-05-17 MED ORDER — SODIUM CHLORIDE 0.9 % IR SOLN
Status: DC | PRN
  Administered 2014-05-17: 1000 mL via INTRAVESICAL

## 2014-05-17 MED ORDER — GLYCOPYRROLATE 0.2 MG/ML IJ SOLN
INTRAMUSCULAR | Status: AC
  Filled 2014-05-17: qty 2

## 2014-05-17 MED ORDER — PHENYLEPHRINE HCL 10 MG/ML IJ SOLN
INTRAMUSCULAR | Status: DC | PRN
  Administered 2014-05-17 (×5): 40 ug via INTRAVENOUS

## 2014-05-17 MED ORDER — DEXAMETHASONE SODIUM PHOSPHATE 10 MG/ML IJ SOLN
INTRAMUSCULAR | Status: AC
  Filled 2014-05-17: qty 1

## 2014-05-17 MED ORDER — ESCITALOPRAM OXALATE 10 MG PO TABS
10.0000 mg | ORAL_TABLET | Freq: Every day | ORAL | Status: DC
Start: 2014-05-17 — End: 2014-05-18
  Administered 2014-05-17: 10 mg via ORAL
  Filled 2014-05-17 (×2): qty 1

## 2014-05-17 MED ORDER — DOCUSATE SODIUM 100 MG PO CAPS
100.0000 mg | ORAL_CAPSULE | Freq: Two times a day (BID) | ORAL | Status: DC
  Administered 2014-05-17 – 2014-05-18 (×2): 100 mg via ORAL
  Filled 2014-05-17 (×2): qty 1

## 2014-05-17 MED ORDER — FENTANYL CITRATE 0.05 MG/ML IJ SOLN
INTRAMUSCULAR | Status: DC | PRN
  Administered 2014-05-17: 50 ug via INTRAVENOUS
  Administered 2014-05-17: 100 ug via INTRAVENOUS
  Administered 2014-05-17 (×2): 50 ug via INTRAVENOUS

## 2014-05-17 MED ORDER — ACETAMINOPHEN 325 MG PO TABS
650.0000 mg | ORAL_TABLET | ORAL | Status: DC | PRN
  Administered 2014-05-17: 650 mg via ORAL
  Filled 2014-05-17: qty 2

## 2014-05-17 MED ORDER — MIDAZOLAM HCL 2 MG/2ML IJ SOLN
INTRAMUSCULAR | Status: AC
  Filled 2014-05-17: qty 2

## 2014-05-17 MED ORDER — IRBESARTAN 300 MG PO TABS
300.0000 mg | ORAL_TABLET | Freq: Every day | ORAL | Status: DC
  Administered 2014-05-17: 300 mg via ORAL
  Filled 2014-05-17: qty 1

## 2014-05-17 MED ORDER — SODIUM CHLORIDE 0.9 % IV BOLUS (SEPSIS)
1000.0000 mL | Freq: Once | INTRAVENOUS | Status: AC
  Administered 2014-05-17: 1000 mL via INTRAVENOUS

## 2014-05-17 MED ORDER — LACTATED RINGERS IV SOLN
INTRAVENOUS | Status: DC | PRN
  Administered 2014-05-17 (×2): via INTRAVENOUS

## 2014-05-17 MED ORDER — HYDROMORPHONE HCL 1 MG/ML IJ SOLN
INTRAMUSCULAR | Status: AC
  Filled 2014-05-17: qty 1

## 2014-05-17 MED ORDER — HYDROCHLOROTHIAZIDE 25 MG PO TABS
25.0000 mg | ORAL_TABLET | Freq: Every day | ORAL | Status: DC
  Administered 2014-05-17: 25 mg via ORAL
  Filled 2014-05-17: qty 1

## 2014-05-17 MED ORDER — CIPROFLOXACIN HCL 500 MG PO TABS: 500.0000 mg | ORAL_TABLET | Freq: Two times a day (BID) | ORAL | Status: DC

## 2014-05-17 MED ORDER — HEPARIN SODIUM (PORCINE) 1000 UNIT/ML IJ SOLN
INTRAMUSCULAR | Status: AC
  Filled 2014-05-17: qty 1

## 2014-05-17 MED ORDER — ONDANSETRON HCL 4 MG/2ML IJ SOLN: 4.0000 mg | Freq: Once | INTRAMUSCULAR | Status: DC | PRN

## 2014-05-17 MED ORDER — GLYCOPYRROLATE 0.2 MG/ML IJ SOLN
INTRAMUSCULAR | Status: DC | PRN
  Administered 2014-05-17: 0.4 mg via INTRAVENOUS
  Administered 2014-05-17: 0.6 mg via INTRAVENOUS

## 2014-05-17 MED ORDER — MIDAZOLAM HCL 5 MG/5ML IJ SOLN
INTRAMUSCULAR | Status: DC | PRN
  Administered 2014-05-17: 2 mg via INTRAVENOUS

## 2014-05-17 MED ORDER — LIDOCAINE HCL (CARDIAC) 20 MG/ML IV SOLN
INTRAVENOUS | Status: DC | PRN
  Administered 2014-05-17: 50 mg via INTRAVENOUS

## 2014-05-17 MED ORDER — GLYCOPYRROLATE 0.2 MG/ML IJ SOLN
INTRAMUSCULAR | Status: AC
  Filled 2014-05-17: qty 4

## 2014-05-17 MED ORDER — VALSARTAN-HYDROCHLOROTHIAZIDE 320-25 MG PO TABS: 1.0000 | ORAL_TABLET | Freq: Every day | ORAL | Status: DC

## 2014-05-17 MED ORDER — KETOROLAC TROMETHAMINE 15 MG/ML IJ SOLN
INTRAMUSCULAR | Status: AC
  Filled 2014-05-17: qty 1

## 2014-05-17 MED ORDER — HYDROMORPHONE HCL 1 MG/ML IJ SOLN
0.2500 mg | INTRAMUSCULAR | Status: DC | PRN
  Administered 2014-05-17 (×4): 0.5 mg via INTRAVENOUS

## 2014-05-17 MED ORDER — LIDOCAINE HCL (CARDIAC) 20 MG/ML IV SOLN
INTRAVENOUS | Status: AC
  Filled 2014-05-17: qty 5

## 2014-05-17 MED ORDER — KCL IN DEXTROSE-NACL 20-5-0.45 MEQ/L-%-% IV SOLN
INTRAVENOUS | Status: DC
  Administered 2014-05-17 – 2014-05-18 (×3): via INTRAVENOUS
  Filled 2014-05-17 (×4): qty 1000

## 2014-05-17 SURGICAL SUPPLY — 50 items
CABLE HIGH FREQUENCY MONO STRZ (ELECTRODE) ×4 IMPLANT
CATH FOLEY 2WAY SLVR 18FR 30CC (CATHETERS) ×4 IMPLANT
CATH ROBINSON RED A/P 16FR (CATHETERS) ×4 IMPLANT
CATH ROBINSON RED A/P 8FR (CATHETERS) ×4 IMPLANT
CATH TIEMANN FOLEY 18FR 5CC (CATHETERS) ×4 IMPLANT
CHLORAPREP W/TINT 26ML (MISCELLANEOUS) ×4 IMPLANT
CLIP LIGATING HEM O LOK PURPLE (MISCELLANEOUS) ×8 IMPLANT
CLOTH BEACON ORANGE TIMEOUT ST (SAFETY) ×4 IMPLANT
COVER SURGICAL LIGHT HANDLE (MISCELLANEOUS) ×4 IMPLANT
COVER TIP SHEARS 8 DVNC (MISCELLANEOUS) ×2 IMPLANT
COVER TIP SHEARS 8MM DA VINCI (MISCELLANEOUS) ×2
CUTTER ECHEON FLEX ENDO 45 340 (ENDOMECHANICALS) ×4 IMPLANT
DECANTER SPIKE VIAL GLASS SM (MISCELLANEOUS) ×2 IMPLANT
DRAPE SURG IRRIG POUCH 19X23 (DRAPES) ×4 IMPLANT
DRSG TEGADERM 4X4.75 (GAUZE/BANDAGES/DRESSINGS) ×4 IMPLANT
DRSG TEGADERM 6X8 (GAUZE/BANDAGES/DRESSINGS) ×8 IMPLANT
ELECT REM PT RETURN 9FT ADLT (ELECTROSURGICAL) ×4
ELECTRODE REM PT RTRN 9FT ADLT (ELECTROSURGICAL) ×2 IMPLANT
GLOVE BIO SURGEON STRL SZ 6.5 (GLOVE) ×3 IMPLANT
GLOVE BIO SURGEONS STRL SZ 6.5 (GLOVE) ×1
GLOVE BIOGEL M STRL SZ7.5 (GLOVE) ×16 IMPLANT
GOWN STRL REUS W/TWL LRG LVL3 (GOWN DISPOSABLE) ×14 IMPLANT
HOLDER FOLEY CATH W/STRAP (MISCELLANEOUS) ×4 IMPLANT
IV LACTATED RINGERS 1000ML (IV SOLUTION) ×2 IMPLANT
KIT ACCESSORY DA VINCI DISP (KITS) ×2
KIT ACCESSORY DVNC DISP (KITS) ×2 IMPLANT
LIQUID BAND (GAUZE/BANDAGES/DRESSINGS) ×2 IMPLANT
MANIFOLD NEPTUNE II (INSTRUMENTS) ×4 IMPLANT
NDL SAFETY ECLIPSE 18X1.5 (NEEDLE) ×2 IMPLANT
NEEDLE HYPO 18GX1.5 SHARP (NEEDLE) ×4
PACK ROBOT UROLOGY CUSTOM (CUSTOM PROCEDURE TRAY) ×4 IMPLANT
RELOAD GREEN ECHELON 45 (STAPLE) ×4 IMPLANT
SET TUBE IRRIG SUCTION NO TIP (IRRIGATION / IRRIGATOR) ×4 IMPLANT
SHEET LAVH (DRAPES) ×2 IMPLANT
SOLUTION ELECTROLUBE (MISCELLANEOUS) ×4 IMPLANT
SUT ETHILON 3 0 PS 1 (SUTURE) ×4 IMPLANT
SUT MNCRL 3 0 RB1 (SUTURE) ×2 IMPLANT
SUT MNCRL 3 0 VIOLET RB1 (SUTURE) ×2 IMPLANT
SUT MNCRL AB 4-0 PS2 18 (SUTURE) ×8 IMPLANT
SUT MONOCRYL 3 0 RB1 (SUTURE) ×4
SUT VIC AB 0 CT1 27 (SUTURE) ×4
SUT VIC AB 0 CT1 27XBRD ANTBC (SUTURE) ×2 IMPLANT
SUT VIC AB 0 UR5 27 (SUTURE) ×4 IMPLANT
SUT VIC AB 2-0 SH 27 (SUTURE) ×8
SUT VIC AB 2-0 SH 27X BRD (SUTURE) ×2 IMPLANT
SUT VICRYL 0 UR6 27IN ABS (SUTURE) ×8 IMPLANT
SYR 27GX1/2 1ML LL SAFETY (SYRINGE) ×4 IMPLANT
TOWEL OR 17X26 10 PK STRL BLUE (TOWEL DISPOSABLE) ×4 IMPLANT
TOWEL OR NON WOVEN STRL DISP B (DISPOSABLE) ×4 IMPLANT
WATER STERILE IRR 1500ML POUR (IV SOLUTION) ×8 IMPLANT

## 2014-05-17 NOTE — Discharge Instructions (Signed)

## 2014-05-17 NOTE — Transfer of Care (Signed)
Immediate Anesthesia Transfer of Care Note  Patient: Thomas [name]  Procedure(s) Performed: Procedure(s): ROBOTIC ASSISTED LAPAROSCOPIC RADICAL PROSTATECTOMY LEVEL 2 (N/A) PELVIC LYMPHADENECTOMY (Bilateral)  Patient Location: PACU  Anesthesia Type:General  Level of Consciousness: awake, alert  and oriented  Airway & Oxygen Therapy: Patient Spontanous Breathing and Patient connected to face mask oxygen  Post-op Assessment: Report given to RN and Post -op Vital signs reviewed and stable  Post vital signs: Reviewed and stable  Last Vitals:  Filed Vitals:   05/17/14 0516  BP: 144/77  Pulse: 74  Temp: 36.7 C  Resp: 18    Complications: No apparent anesthesia complications

## 2014-05-17 NOTE — Op Note (Signed)

## 2014-05-17 NOTE — Anesthesia Preprocedure Evaluation (Signed)
Anesthesia Evaluation  Patient identified by MRN, date of birth, ID band Patient awake    Reviewed: Allergy & Precautions, Patient's Chart, lab work & pertinent test results  Airway        Dental   Pulmonary          Cardiovascular hypertension,     Neuro/Psych    GI/Hepatic   Endo/Other    Renal/GU      Musculoskeletal  (+) Arthritis -,   Abdominal   Peds  Hematology   Anesthesia Other Findings   Reproductive/Obstetrics                             Anesthesia Physical Anesthesia Plan  ASA: I  Anesthesia Plan: General   Post-op Pain Management:    Induction: Intravenous  Airway Management Planned: Oral ETT  Additional Equipment:   Intra-op Plan:   Post-operative Plan: Extubation in OR  Informed Consent: I have reviewed the patients History and Physical, chart, labs and discussed the procedure including the risks, benefits and alternatives for the proposed anesthesia with the patient or authorized representative who has indicated his/her understanding and acceptance.     Plan Discussed with: CRNA, Anesthesiologist and Surgeon  Anesthesia Plan Comments:         Anesthesia Quick Evaluation

## 2014-05-17 NOTE — Progress Notes (Signed)
Post-op note  Subjective: The patient is doing well.  No complaints.  Objective: Vital signs in last 24 hours: Temp:  [97.8 F (36.6 C)-98 F (36.7 C)] 97.8 F (36.6 C) (03/03 1204) Pulse Rate:  [59-74] 63 (03/03 1204) Resp:  [14-18] 15 (03/03 1204) BP: (98-145)/(59-78) 145/62 mmHg (03/03 1204) SpO2:  [97 %-100 %] 98 % (03/03 1204) Weight:  [135.172 kg (298 lb)] 135.172 kg (298 lb) (03/03 0525)  Intake/Output from previous day:   Intake/Output this shift: Total I/O In: 3860 [P.O.:360; I.V.:2500; IV Piggyback:1000] Out: 390 [Urine:50; Drains:40; Blood:300]  Physical Exam:  General: Alert and oriented. Abdomen: Soft, Nondistended. Incisions: Clean and dry. Urine: red  Lab Results:  Recent Labs  05/17/14 1101  HGB 14.9  HCT 44.8    Assessment/Plan: POD#0   1) Continue to monitor  2) DVT prophy, clears, IS, amb, pain control   LOS: 0 days   Thomas [name] 05/17/2014, 3:00 PM

## 2014-05-17 NOTE — Progress Notes (Signed)
Pt has ambulated on hall. Back in bed with SCDs on and ted hose. No complaints no s/s of distress noted

## 2014-05-17 NOTE — Anesthesia Postprocedure Evaluation (Signed)
  Anesthesia Post-op Note  Patient: Thomas [name]  Procedure(s) Performed: Procedure(s): ROBOTIC ASSISTED LAPAROSCOPIC RADICAL PROSTATECTOMY LEVEL 2 (N/A) PELVIC LYMPHADENECTOMY (Bilateral)  Patient Location: PACU  Anesthesia Type:General  Level of Consciousness: awake, alert , oriented and patient cooperative  Airway and Oxygen Therapy: Patient Spontanous Breathing  Post-op Pain: mild  Post-op Assessment: Post-op Vital signs reviewed, Patient's Cardiovascular Status Stable, Respiratory Function Stable, Patent Airway, No signs of Nausea or vomiting and Pain level controlled  Post-op Vital Signs: stable  Last Vitals:  Filed Vitals:   05/17/14 1204  BP: 145/62  Pulse: 63  Temp: 36.6 C  Resp: 15    Complications: No apparent anesthesia complications

## 2014-05-17 NOTE — Anesthesia Procedure Notes (Signed)
Procedure Name: Intubation Date/Time: 05/17/2014 7:27 AM Performed by: Noralyn Pick Pre-anesthesia Checklist: Patient identified, Emergency Drugs available, Suction available and Patient being monitored Patient Re-evaluated:Patient Re-evaluated prior to inductionOxygen Delivery Method: Circle System Utilized Preoxygenation: Pre-oxygenation with 100% oxygen Intubation Type: IV induction Ventilation: Mask ventilation without difficulty Laryngoscope Size: 4 Tube type: Oral Number of attempts: 1 Airway Equipment and Method: Stylet and Oral airway Placement Confirmation: ETT inserted through vocal cords under direct vision,  positive ETCO2 and breath sounds checked- equal and bilateral Secured at: 22 cm Tube secured with: Tape Dental Injury: Teeth and Oropharynx as per pre-operative assessment

## 2014-05-17 NOTE — Interval H&P Note (Signed)
History and Physical Interval Note:  05/17/2014 7:06 AM  Thomas [name]  has presented today for surgery, with the diagnosis of prostate cancer  The various methods of treatment have been discussed with the patient and family. After consideration of risks, benefits and other options for treatment, the patient has consented to  Procedure(s): ROBOTIC ASSISTED LAPAROSCOPIC RADICAL PROSTATECTOMY LEVEL 2 (N/A) PELVIC LYMPHADENECTOMY (Bilateral) as a surgical intervention .  The patient's history has been reviewed, patient examined, no change in status, stable for surgery.  I have reviewed the patient's chart and labs.  Questions were answered to the patient's satisfaction.     Anamari Galeas,LES

## 2014-05-18 ENCOUNTER — Encounter (HOSPITAL_COMMUNITY): Payer: Self-pay | Admitting: Urology

## 2014-05-18 LAB — HEMOGLOBIN AND HEMATOCRIT, BLOOD
HCT: 42.7 % (ref 39.0–52.0)
Hemoglobin: 14 g/dL (ref 13.0–17.0)

## 2014-05-18 MED ORDER — BISACODYL 10 MG RE SUPP
10.0000 mg | Freq: Once | RECTAL | Status: AC
  Administered 2014-05-18: 10 mg via RECTAL
  Filled 2014-05-18: qty 1

## 2014-05-18 MED ORDER — HYDROCODONE-ACETAMINOPHEN 5-325 MG PO TABS: 1.0000 | ORAL_TABLET | Freq: Four times a day (QID) | ORAL | Status: DC | PRN

## 2014-05-18 NOTE — Discharge Summary (Signed)
  Date of admission: 05/17/2014  Date of discharge: 05/18/2014  Admission diagnosis: Prostate Cancer  Discharge diagnosis: Prostate Cancer  History and Physical: For full details, please see admission history and physical. Briefly, Thomas [name] is a 59 y.o. gentleman with localized prostate cancer.  After discussing management/treatment options, he elected to proceed with surgical treatment.  Hospital Course: Thomas [name] was taken to the operating room on 05/17/2014 and underwent a robotic assisted laparoscopic radical prostatectomy. He tolerated this procedure well and without complications. Postoperatively, he was able to be transferred to a regular hospital room following recovery from anesthesia.  He was able to begin ambulating the night of surgery. He remained hemodynamically stable overnight.  He had excellent urine output with appropriately minimal output from his pelvic drain and his pelvic drain was removed on POD #1.  He was transitioned to oral pain medication, tolerated a clear liquid diet, and had met all discharge criteria and was able to be discharged home later on POD#1.  Laboratory values:  Recent Labs  05/17/14 1101 05/18/14 0415  HGB 14.9 14.0  HCT 44.8 42.7    Disposition: Home  Discharge instruction: He was instructed to be ambulatory but to refrain from heavy lifting, strenuous activity, or driving. He was instructed on urethral catheter care.  Discharge medications:     Medication List    STOP taking these medications        finasteride 5 MG tablet  Commonly known as:  PROSCAR     ibuprofen 200 MG tablet  Commonly known as:  ADVIL,MOTRIN     meloxicam 15 MG tablet  Commonly known as:  MOBIC     tamsulosin 0.4 MG Caps capsule  Commonly known as:  FLOMAX      TAKE these medications        ciprofloxacin 500 MG tablet  Commonly known as:  CIPRO  Take 1 tablet (500 mg total) by mouth 2 (two) times daily. Start day prior to office visit for foley  removal     escitalopram 10 MG tablet  Commonly known as:  LEXAPRO  Take 10 mg by mouth daily.     HYDROcodone-acetaminophen 5-325 MG per tablet  Commonly known as:  NORCO  Take 1-2 tablets by mouth every 6 (six) hours as needed.     methocarbamol 500 MG tablet  Commonly known as:  ROBAXIN  Take 500 mg by mouth every 8 (eight) hours as needed for muscle spasms.     valsartan-hydrochlorothiazide 320-25 MG per tablet  Commonly known as:  DIOVAN-HCT  Take 1 tablet by mouth at bedtime.        Followup: He will followup in 1 week for catheter removal and to discuss his surgical pathology results.

## 2014-05-18 NOTE — Care Management Note (Signed)
    Page 1 of 1   05/18/2014     12:58:11 PM CARE MANAGEMENT NOTE 05/18/2014  Patient:  SHAYMUS, EVELETH   Account Number:  [id number]  Date Initiated:  05/18/2014  Documentation initiated by:  Dessa Phi  Subjective/Objective Assessment:   59 y/o m admitted w/Prostate Ca.     Action/Plan:   From home.   Anticipated DC Date:  05/19/2014   Anticipated DC Plan:  [place]  CM consult      Choice offered to / List presented to:             Status of service:  Completed, signed off Medicare Important Message given?   (If response is "NO", the following Medicare IM given date fields will be blank) Date Medicare IM given:   Medicare IM given by:   Date Additional Medicare IM given:   Additional Medicare IM given by:    Discharge Disposition:  HOME/SELF CARE  Per UR Regulation:  Reviewed for med. necessity/level of care/duration of stay  If discussed at [place] of Stay Meetings, dates discussed:    Comments:  05/18/14 Dessa Phi RN BSN NCM [phone] POD#1 lap prostatectomy. No anticipated d/c needs.

## 2014-05-18 NOTE — Progress Notes (Signed)
Patient ID: Thomas [name], male   DOB: 02/22/1956, 59 y.o.   MRN: [id number]  1 Day Post-Op Subjective: The patient is doing well.  No nausea or vomiting. Pain is adequately controlled.  Objective: Vital signs in last 24 hours: Temp:  [97.3 F (36.3 C)-100.6 F (38.1 C)] 97.3 F (36.3 C) (03/04 0449) Pulse Rate:  [56-87] 56 (03/04 0449) Resp:  [14-18] 18 (03/04 0449) BP: (98-145)/(52-78) 128/66 mmHg (03/04 0449) SpO2:  [94 %-100 %] 96 % (03/04 0449)  Intake/Output from previous day: 03/03 0701 - 03/04 0700 In: 7295 [P.O.:1440; I.V.:4805; IV Piggyback:1050] Out: 2990 [Urine:2500; Drains:190; Blood:300] Intake/Output this shift:    Physical Exam:  General: Alert and oriented. CV: RRR Lungs: Clear bilaterally. GI: Soft, Nondistended. Incisions: Clean, dry, and intact Urine: Clear Extremities: Nontender, no erythema, no edema.  Lab Results:  Recent Labs  05/17/14 1101 05/18/14 0415  HGB 14.9 14.0  HCT 44.8 42.7      Assessment/Plan: POD# 1 s/p robotic prostatectomy.  1) SL IVF 2) Ambulate, Incentive spirometry 3) Transition to oral pain medication 4) Dulcolax suppository 5) D/C pelvic drain 6) Plan for likely discharge later today   Pryor Curia. MD   LOS: 1 day   Tytiana Coles,LES 05/18/2014, 7:34 AM

## 2014-05-24 ENCOUNTER — Encounter: Payer: Self-pay | Admitting: Radiation Oncology

## 2015-01-25 ENCOUNTER — Other Ambulatory Visit: Payer: Self-pay | Admitting: Surgery

## 2015-03-17 HISTORY — PX: INCISION AND DRAINAGE OF WOUND: SHX1803

## 2015-06-20 DIAGNOSIS — F331 Major depressive disorder, recurrent, moderate: Secondary | ICD-10-CM | POA: Diagnosis not present

## 2015-07-02 DIAGNOSIS — F331 Major depressive disorder, recurrent, moderate: Secondary | ICD-10-CM | POA: Diagnosis not present

## 2015-07-25 DIAGNOSIS — F329 Major depressive disorder, single episode, unspecified: Secondary | ICD-10-CM | POA: Diagnosis not present

## 2015-08-01 DIAGNOSIS — F331 Major depressive disorder, recurrent, moderate: Secondary | ICD-10-CM | POA: Diagnosis not present

## 2015-08-06 DIAGNOSIS — B880 Other acariasis: Secondary | ICD-10-CM | POA: Diagnosis not present

## 2015-08-14 DIAGNOSIS — F331 Major depressive disorder, recurrent, moderate: Secondary | ICD-10-CM | POA: Diagnosis not present

## 2015-08-29 DIAGNOSIS — F331 Major depressive disorder, recurrent, moderate: Secondary | ICD-10-CM | POA: Diagnosis not present

## 2015-09-03 DIAGNOSIS — F39 Unspecified mood [affective] disorder: Secondary | ICD-10-CM | POA: Diagnosis not present

## 2015-09-03 DIAGNOSIS — I10 Essential (primary) hypertension: Secondary | ICD-10-CM | POA: Diagnosis not present

## 2015-09-05 DIAGNOSIS — F331 Major depressive disorder, recurrent, moderate: Secondary | ICD-10-CM | POA: Diagnosis not present

## 2015-09-16 DIAGNOSIS — F331 Major depressive disorder, recurrent, moderate: Secondary | ICD-10-CM | POA: Diagnosis not present

## 2015-09-20 DIAGNOSIS — K432 Incisional hernia without obstruction or gangrene: Secondary | ICD-10-CM | POA: Diagnosis not present

## 2015-09-27 DIAGNOSIS — F331 Major depressive disorder, recurrent, moderate: Secondary | ICD-10-CM | POA: Diagnosis not present

## 2015-10-08 DIAGNOSIS — F331 Major depressive disorder, recurrent, moderate: Secondary | ICD-10-CM | POA: Diagnosis not present

## 2015-10-18 DIAGNOSIS — F331 Major depressive disorder, recurrent, moderate: Secondary | ICD-10-CM | POA: Diagnosis not present

## 2015-10-24 DIAGNOSIS — F329 Major depressive disorder, single episode, unspecified: Secondary | ICD-10-CM | POA: Diagnosis not present

## 2015-11-06 DIAGNOSIS — F331 Major depressive disorder, recurrent, moderate: Secondary | ICD-10-CM | POA: Diagnosis not present

## 2015-11-20 DIAGNOSIS — F331 Major depressive disorder, recurrent, moderate: Secondary | ICD-10-CM | POA: Diagnosis not present

## 2015-12-04 DIAGNOSIS — F331 Major depressive disorder, recurrent, moderate: Secondary | ICD-10-CM | POA: Diagnosis not present

## 2015-12-11 DIAGNOSIS — N5201 Erectile dysfunction due to arterial insufficiency: Secondary | ICD-10-CM | POA: Diagnosis not present

## 2015-12-11 DIAGNOSIS — C61 Malignant neoplasm of prostate: Secondary | ICD-10-CM | POA: Diagnosis not present

## 2015-12-20 DIAGNOSIS — M6208 Separation of muscle (nontraumatic), other site: Secondary | ICD-10-CM | POA: Diagnosis not present

## 2015-12-20 DIAGNOSIS — K432 Incisional hernia without obstruction or gangrene: Secondary | ICD-10-CM | POA: Diagnosis not present

## 2016-01-08 DIAGNOSIS — F331 Major depressive disorder, recurrent, moderate: Secondary | ICD-10-CM | POA: Diagnosis not present

## 2016-01-09 DIAGNOSIS — R194 Change in bowel habit: Secondary | ICD-10-CM | POA: Diagnosis not present

## 2016-01-17 DIAGNOSIS — K5900 Constipation, unspecified: Secondary | ICD-10-CM | POA: Diagnosis not present

## 2016-01-17 DIAGNOSIS — R194 Change in bowel habit: Secondary | ICD-10-CM | POA: Diagnosis not present

## 2016-01-19 ENCOUNTER — Other Ambulatory Visit: Payer: Self-pay | Admitting: Surgery

## 2016-01-22 DIAGNOSIS — F331 Major depressive disorder, recurrent, moderate: Secondary | ICD-10-CM | POA: Diagnosis not present

## 2016-02-12 DIAGNOSIS — F331 Major depressive disorder, recurrent, moderate: Secondary | ICD-10-CM | POA: Diagnosis not present

## 2016-02-25 ENCOUNTER — Encounter (HOSPITAL_COMMUNITY): Admission: RE | Admit: 2016-02-25 | Payer: BLUE CROSS/BLUE SHIELD | Source: Ambulatory Visit

## 2016-02-27 DIAGNOSIS — F331 Major depressive disorder, recurrent, moderate: Secondary | ICD-10-CM | POA: Diagnosis not present

## 2016-03-20 DIAGNOSIS — F331 Major depressive disorder, recurrent, moderate: Secondary | ICD-10-CM | POA: Diagnosis not present

## 2016-03-20 NOTE — Progress Notes (Signed)
LOV 09/03/2015 Josetta Huddle MD [facility] in chart EKG in chart 05/06/2015

## 2016-03-20 NOTE — Patient Instructions (Addendum)
Thomas [name]  03/20/2016   Your procedure is scheduled on: 03/27/2016  Report to [facility] Main  Entrance take [facility]  elevators to 3rd floor to  [place] at 1130AM.  Call this number if you have problems the morning of surgery [phone]   Remember: ONLY 1 PERSON MAY GO WITH YOU TO SHORT STAY TO GET  READY MORNING OF [place].  Do not eat food After [place] SURGERY 03/26/2016. MAY HAVE CLEAR LIQUIDS(SEE DIET BELOW) UNTIL 7:30AM DAY  OF SURGERY 03/27/2016. AFTER 7:30AM NOTHING BY MOUTH.      CLEAR LIQUID DIET   Foods Allowed                                                                     Foods Excluded  Coffee and tea, regular and decaf                             liquids that you cannot  Plain Jell-O in any flavor                                             see through such as: Fruit ices (not with fruit pulp)                                     milk, soups, orange juice  Iced Popsicles                                    All solid food Carbonated beverages, regular and diet                                    Cranberry, grape and apple juices Sports drinks like Gatorade Lightly seasoned clear broth or consume(fat free) Sugar, honey syrup  Sample Menu Breakfast                                Lunch                                     Supper Cranberry juice                    Beef broth                            Chicken broth Jell-O                                     Grape juice  Apple juice Coffee or tea                        Jell-O                                      Popsicle                                                Coffee or tea                        Coffee or tea  _____________________________________________________________________     Take these medicines the morning of surgery with A SIP OF WATER: NONE                                You may not have any metal on your body including  hair pins and              piercings  Do not wear jewelry, make-up, lotions, powders or perfumes, deodorant             Do not wear nail polish.  Do not shave  48 hours prior to surgery.              Men may shave face and neck.   Do not bring valuables to the hospital. [place].  Contacts, dentures or bridgework may not be worn into surgery.  Leave suitcase in the car. After surgery it may be brought to your room.     Patients discharged the day of surgery will not be allowed to drive home.  Name and phone number of your driver:  Special Instructions: N/A              Please read over the following fact sheets you were given: _____________________________________________________________________             [facility] - Preparing for Surgery Before surgery, you can play an important role.  Because skin is not sterile, your skin needs to be as free of germs as possible.  You can reduce the number of germs on your skin by washing with CHG (chlorahexidine gluconate) soap before surgery.  CHG is an antiseptic cleaner which kills germs and bonds with the skin to continue killing germs even after washing. Please DO NOT use if you have an allergy to CHG or antibacterial soaps.  If your skin becomes reddened/irritated stop using the CHG and inform your nurse when you arrive at Short Stay. Do not shave (including legs and underarms) for at least 48 hours prior to the first CHG shower.  You may shave your face/neck. Please follow these instructions carefully:  1.  Shower with CHG Soap the night before surgery and the  morning of Surgery.  2.  If you choose to wash your hair, wash your hair first as usual with your  normal  shampoo.  3.  After you shampoo, rinse your hair and body thoroughly to remove the  shampoo.  4.  Use CHG as you would any other liquid soap.  You can apply chg directly  to the skin and wash                        Gently with a scrungie or clean washcloth.  5.  Apply the CHG Soap to your body ONLY FROM THE NECK DOWN.   Do not use on face/ open                           Wound or open sores. Avoid contact with eyes, ears mouth and genitals (private parts).                       Wash face,  Genitals (private parts) with your normal soap.             6.  Wash thoroughly, paying special attention to the area where your surgery  will be performed.  7.  Thoroughly rinse your body with warm water from the neck down.  8.  DO NOT shower/wash with your normal soap after using and rinsing off  the CHG Soap.                9.  Pat yourself dry with a clean towel.            10.  Wear clean pajamas.            11.  Place clean sheets on your bed the night of your first shower and do not  sleep with pets. Day of Surgery : Do not apply any lotions/deodorants the morning of surgery.  Please wear clean clothes to the hospital/surgery center.  FAILURE TO FOLLOW THESE INSTRUCTIONS MAY RESULT IN THE CANCELLATION OF YOUR SURGERY PATIENT SIGNATURE_________________________________  NURSE SIGNATURE__________________________________  ________________________________________________________________________

## 2016-03-24 ENCOUNTER — Encounter (HOSPITAL_COMMUNITY)
Admission: RE | Admit: 2016-03-24 | Discharge: 2016-03-24 | Disposition: A | Discharge status: Home / Self Care | Payer: BLUE CROSS/BLUE SHIELD | Source: Ambulatory Visit | Attending: Surgery | Admitting: Surgery

## 2016-03-24 ENCOUNTER — Encounter (HOSPITAL_COMMUNITY): Payer: Self-pay

## 2016-03-24 DIAGNOSIS — Z79899 Other long term (current) drug therapy: Secondary | ICD-10-CM | POA: Diagnosis not present

## 2016-03-24 DIAGNOSIS — Z8546 Personal history of malignant neoplasm of prostate: Secondary | ICD-10-CM | POA: Diagnosis not present

## 2016-03-24 DIAGNOSIS — K432 Incisional hernia without obstruction or gangrene: Secondary | ICD-10-CM | POA: Diagnosis not present

## 2016-03-24 DIAGNOSIS — Z01818 Encounter for other preprocedural examination: Secondary | ICD-10-CM | POA: Insufficient documentation

## 2016-03-24 DIAGNOSIS — E785 Hyperlipidemia, unspecified: Secondary | ICD-10-CM | POA: Diagnosis not present

## 2016-03-24 DIAGNOSIS — I10 Essential (primary) hypertension: Secondary | ICD-10-CM | POA: Diagnosis not present

## 2016-03-24 DIAGNOSIS — Z6841 Body Mass Index (BMI) 40.0 and over, adult: Secondary | ICD-10-CM | POA: Diagnosis not present

## 2016-03-24 DIAGNOSIS — Q7959 Other congenital malformations of abdominal wall: Secondary | ICD-10-CM | POA: Diagnosis not present

## 2016-03-24 HISTORY — DX: Gastro-esophageal reflux disease without esophagitis: K21.9

## 2016-03-24 LAB — BASIC METABOLIC PANEL
Anion gap: 7 (ref 5–15)
BUN: 19 mg/dL (ref 6–20)
CHLORIDE: 103 mmol/L (ref 101–111)
CO2: 33 mmol/L — ABNORMAL HIGH (ref 22–32)
Calcium: 9.9 mg/dL (ref 8.9–10.3)
Creatinine, Ser: 1.18 mg/dL (ref 0.61–1.24)
GFR calc Af Amer: >60 mL/min (ref >60)
GFR calc non Af Amer: >60 mL/min (ref >60)
Glucose, Bld: 96 mg/dL (ref 65–99)
POTASSIUM: 4.5 mmol/L (ref 3.5–5.1)
Sodium: 143 mmol/L (ref 135–145)

## 2016-03-24 LAB — CBC
HCT: 50.2 % (ref 39.0–52.0)
Hemoglobin: 16.9 g/dL (ref 13.0–17.0)
MCH: 29.3 pg (ref 26.0–34.0)
MCHC: 33.7 g/dL (ref 30.0–36.0)
MCV: 87 fL (ref 78.0–100.0)
Platelets: 186 10*3/uL (ref 150–400)
RBC: 5.77 MIL/uL (ref 4.22–5.81)
RDW: 13.4 % (ref 11.5–15.5)
WBC: 7.6 10*3/uL (ref 4.0–10.5)

## 2016-03-26 MED ORDER — DEXTROSE 5 % IV SOLN
3.0000 g | INTRAVENOUS | Status: AC
  Administered 2016-03-27: 3 g via INTRAVENOUS
  Filled 2016-03-26: qty 3

## 2016-03-26 NOTE — H&P (Signed)
Thomas [name]  Location: [facility] Surgery Patient #: [id number] DOB: Jan 11, 1956 Married / Language: English / Race: White Male  History of Present Illness   The patient is a 61 year old male who presents for an evaluation of a hernia.   His PCP is Dr. Brayton Layman.  He comes by himself. (But Thomas [name] was on the cell phone)  He is on for surgery for 03/27/2016. I last saw Thomas [name] in October 2017 for an abdominal wall ventral hernia. Our hope was that he lose down to 250 pounds blood before hernia repair. Unfortunately, he has gained about 5 pounds over the holidays. He had had some change in his bowels. He had a colonoscopy by dr. Cristina [name] on 01/17/2016 which was negative. He was using [employer]ystem - and he has backed off this and this has helped his bowel function. He does have a membership to a gym - 24/7 - and I talked about post op recovery. I told him no heavy lifting for 1 month.  History of abdominal wall hernia (09/2015): He comes today with an increasing incisional hernia, just above the umbilicus, from the prostate surgery by Dr. Alinda Money. He has noticed this since 2 months after surgery, but it may be a little larger or more noticeable over the last 2 months. He has no history of stomach, liver, or colon disease. He's had no nausea or change in bowel habits.  I discussed the indications and complications of hernia surgery with the patient. I discussed both the laparoscopic and open approach to hernia repair. The potential risks of hernia surgery include, but are not limited to, bleeding, infection, open surgery, nerve injury, and recurrence of the hernia.  I provided the patient literature about hernia surgery.  Past Med History: 1. Morbid obesity He is trying to lose weight. 2. HTN 3. Prostate cancer - had resection in March 2016 - is doing well Alinda Money and Valere Dross He is doing well from the prostate cancer 4.  Hyperlipideia 5. Prior hemorrhoids - managed by Dr. Modena Morrow and Dr. Jacinto Reap. Buccini. 6. Colonoscopy by dr. Cristina [name] on 01/17/2016 which was negative.  Social History: Wife, Thomas [name] - separated - but it sounds like they are getting along. They are in counseling. 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   Allergies Malachy Moan, RMA; 03/19/2016 8:45 AM) Amoxicillin *PENICILLINS*  Diarrhea. Augmentin *PENICILLINS*   Medication History Malachy Moan, RMA; 03/19/2016 8:47 AM) Escitalopram Oxalate (10MG  Tablet, Oral) Active. Valsartan-Hydrochlorothiazide (320-25MG  Tablet, Oral) Active. Simvastatin (20MG  Tablet, Oral) Active. Vitamin D (1000UNIT Tablet, Oral daily) Active. Centrum Silver 50+Men (Oral daily) Active. Medications Reconciled  Vitals Malachy Moan RMA; 03/19/2016 8:47 AM) 03/19/2016 8:47 AM Weight: 283.8 lb Height: 71in Body Surface Area: 2.45 m Body Mass Index: 39.58 kg/m  Temp.: 98.11F  Pulse: 70 (Regular)  BP: 130/80 (Sitting, Left Arm, Standard)  Physical Exam  General: obese WM alert and generally healthy appearing. HEENT: Normal. Pupils equal.  Neck: Supple. No mass. No thyroid mass. Scar on the back of his neck looks good. Lymph Nodes: No supraclavicular or cervical nodes.  Lungs: Clear to auscultation and symmetric breath sounds. Heart: RRR. No murmur or rub.  Abdomen: Soft. No mass. No tenderness. Normal bowel sounds.  He has an abdominal wall hernia just above the umbilicus, in the incision made for his prostatectomy It is a little difficult to  determine how large the fascial defect is, about 3 to 4 cm. The hernia is reducible. He has a diastasis above the hernia.  Extremities: Good strength and ROM in upper and lower extremities.  Assessment & Plan  1.  INCISIONAL HERNIA OF ANTERIOR ABDOMINAL  WALL WITHOUT OBSTRUCTION OR GANGRENE (K43.2)  Plan:   1) Surgery set for 03/28/2015  2.  DIASTASIS RECTI (M62.08)  3.  Morbid obesity - BMI 39.6 4. HTN 5. Prostate cancer - had resection in March 2016 - is doing well  Alinda Money and Valere Dross  He is doing well from the prostate cancer 6. Hyperlipideia     Alphonsa Overall, MD, [facility] Surgery Pager: [phone] Office phone:  [phone]

## 2016-03-27 ENCOUNTER — Encounter (HOSPITAL_COMMUNITY): Payer: Self-pay | Admitting: *Deleted

## 2016-03-27 ENCOUNTER — Encounter (HOSPITAL_COMMUNITY)
Admission: RE | Disposition: A | Discharge status: Home / Self Care | Payer: Self-pay | Source: Ambulatory Visit | Attending: Surgery

## 2016-03-27 ENCOUNTER — Ambulatory Visit (HOSPITAL_COMMUNITY): Payer: BLUE CROSS/BLUE SHIELD | Admitting: Anesthesiology

## 2016-03-27 ENCOUNTER — Observation Stay (HOSPITAL_COMMUNITY)
Admission: RE | Admit: 2016-03-27 | Discharge: 2016-03-28 | Disposition: A | Discharge status: Home / Self Care | Payer: BLUE CROSS/BLUE SHIELD | Source: Ambulatory Visit | Attending: Surgery | Admitting: Surgery

## 2016-03-27 DIAGNOSIS — Z8546 Personal history of malignant neoplasm of prostate: Secondary | ICD-10-CM | POA: Insufficient documentation

## 2016-03-27 DIAGNOSIS — K439 Ventral hernia without obstruction or gangrene: Secondary | ICD-10-CM | POA: Diagnosis present

## 2016-03-27 DIAGNOSIS — K432 Incisional hernia without obstruction or gangrene: Secondary | ICD-10-CM | POA: Diagnosis not present

## 2016-03-27 DIAGNOSIS — C61 Malignant neoplasm of prostate: Secondary | ICD-10-CM | POA: Diagnosis not present

## 2016-03-27 DIAGNOSIS — E785 Hyperlipidemia, unspecified: Secondary | ICD-10-CM | POA: Diagnosis not present

## 2016-03-27 DIAGNOSIS — Z79899 Other long term (current) drug therapy: Secondary | ICD-10-CM | POA: Insufficient documentation

## 2016-03-27 DIAGNOSIS — Z6841 Body Mass Index (BMI) 40.0 and over, adult: Secondary | ICD-10-CM | POA: Insufficient documentation

## 2016-03-27 DIAGNOSIS — Q7959 Other congenital malformations of abdominal wall: Secondary | ICD-10-CM | POA: Insufficient documentation

## 2016-03-27 DIAGNOSIS — I10 Essential (primary) hypertension: Secondary | ICD-10-CM | POA: Insufficient documentation

## 2016-03-27 HISTORY — DX: Ventral hernia without obstruction or gangrene: K43.9

## 2016-03-27 HISTORY — PX: VENTRAL HERNIA REPAIR: SHX424

## 2016-03-27 SURGERY — REPAIR, HERNIA, VENTRAL, LAPAROSCOPIC
Anesthesia: General

## 2016-03-27 MED ORDER — PROPOFOL 10 MG/ML IV BOLUS
INTRAVENOUS | Status: DC | PRN
  Administered 2016-03-27: 200 mg via INTRAVENOUS

## 2016-03-27 MED ORDER — ROCURONIUM BROMIDE 50 MG/5ML IV SOSY
PREFILLED_SYRINGE | INTRAVENOUS | Status: AC
  Filled 2016-03-27: qty 5

## 2016-03-27 MED ORDER — HEPARIN SODIUM (PORCINE) 5000 UNIT/ML IJ SOLN
5000.0000 [IU] | Freq: Once | INTRAMUSCULAR | Status: AC
  Administered 2016-03-27: 5000 [IU] via SUBCUTANEOUS
  Filled 2016-03-27: qty 1

## 2016-03-27 MED ORDER — LACTATED RINGERS IR SOLN
Status: DC | PRN
  Administered 2016-03-27: 1000 mL

## 2016-03-27 MED ORDER — BUPIVACAINE HCL (PF) 0.25 % IJ SOLN
INTRAMUSCULAR | Status: AC
  Filled 2016-03-27: qty 30

## 2016-03-27 MED ORDER — HEPARIN SODIUM (PORCINE) 5000 UNIT/ML IJ SOLN
5000.0000 [IU] | Freq: Three times a day (TID) | INTRAMUSCULAR | Status: DC
  Administered 2016-03-27 – 2016-03-28 (×2): 5000 [IU] via SUBCUTANEOUS
  Filled 2016-03-27 (×2): qty 1

## 2016-03-27 MED ORDER — 0.9 % SODIUM CHLORIDE (POUR BTL) OPTIME
TOPICAL | Status: DC | PRN
  Administered 2016-03-27: 1000 mL

## 2016-03-27 MED ORDER — MIDAZOLAM HCL 5 MG/5ML IJ SOLN
INTRAMUSCULAR | Status: DC | PRN
  Administered 2016-03-27: 2 mg via INTRAVENOUS

## 2016-03-27 MED ORDER — HYDROCHLOROTHIAZIDE 25 MG PO TABS
25.0000 mg | ORAL_TABLET | Freq: Every day | ORAL | Status: DC
  Administered 2016-03-27: 25 mg via ORAL
  Filled 2016-03-27: qty 1

## 2016-03-27 MED ORDER — MIDAZOLAM HCL 2 MG/2ML IJ SOLN
INTRAMUSCULAR | Status: AC
  Filled 2016-03-27: qty 2

## 2016-03-27 MED ORDER — SUGAMMADEX SODIUM 200 MG/2ML IV SOLN
INTRAVENOUS | Status: DC | PRN
  Administered 2016-03-27: 400 mg via INTRAVENOUS

## 2016-03-27 MED ORDER — PROPOFOL 10 MG/ML IV BOLUS
INTRAVENOUS | Status: AC
  Filled 2016-03-27: qty 20

## 2016-03-27 MED ORDER — KCL IN DEXTROSE-NACL 20-5-0.45 MEQ/L-%-% IV SOLN
INTRAVENOUS | Status: DC
  Administered 2016-03-27: 20:00:00 via INTRAVENOUS
  Administered 2016-03-28: 75 mL/h via INTRAVENOUS
  Filled 2016-03-27 (×2): qty 1000

## 2016-03-27 MED ORDER — SUGAMMADEX SODIUM 200 MG/2ML IV SOLN
INTRAVENOUS | Status: AC
  Filled 2016-03-27: qty 2

## 2016-03-27 MED ORDER — SUCCINYLCHOLINE CHLORIDE 200 MG/10ML IV SOSY
PREFILLED_SYRINGE | INTRAVENOUS | Status: AC
  Filled 2016-03-27: qty 10

## 2016-03-27 MED ORDER — BUPIVACAINE HCL (PF) 0.25 % IJ SOLN
INTRAMUSCULAR | Status: DC | PRN
  Administered 2016-03-27: 20 mL

## 2016-03-27 MED ORDER — FENTANYL CITRATE (PF) 250 MCG/5ML IJ SOLN
INTRAMUSCULAR | Status: AC
  Filled 2016-03-27: qty 5

## 2016-03-27 MED ORDER — ROCURONIUM BROMIDE 50 MG/5ML IV SOSY
PREFILLED_SYRINGE | INTRAVENOUS | Status: DC | PRN
  Administered 2016-03-27: 10 mg via INTRAVENOUS
  Administered 2016-03-27: 40 mg via INTRAVENOUS

## 2016-03-27 MED ORDER — HYDROMORPHONE HCL 1 MG/ML IJ SOLN: 0.2500 mg | INTRAMUSCULAR | Status: DC | PRN

## 2016-03-27 MED ORDER — ESCITALOPRAM OXALATE 20 MG PO TABS
20.0000 mg | ORAL_TABLET | Freq: Every day | ORAL | Status: DC
  Administered 2016-03-27: 20 mg via ORAL
  Filled 2016-03-27: qty 1

## 2016-03-27 MED ORDER — MORPHINE SULFATE (PF) 2 MG/ML IV SOLN: 1.0000 mg | INTRAVENOUS | Status: DC | PRN

## 2016-03-27 MED ORDER — ONDANSETRON HCL 4 MG/2ML IJ SOLN
INTRAMUSCULAR | Status: DC | PRN
  Administered 2016-03-27: 4 mg via INTRAVENOUS

## 2016-03-27 MED ORDER — LIDOCAINE 2% (20 MG/ML) 5 ML SYRINGE
INTRAMUSCULAR | Status: DC | PRN
  Administered 2016-03-27: 100 mg via INTRAVENOUS

## 2016-03-27 MED ORDER — ONDANSETRON 4 MG PO TBDP: 4.0000 mg | ORAL_TABLET | Freq: Four times a day (QID) | ORAL | Status: DC | PRN

## 2016-03-27 MED ORDER — IRBESARTAN 300 MG PO TABS
300.0000 mg | ORAL_TABLET | Freq: Every day | ORAL | Status: DC
  Administered 2016-03-27: 300 mg via ORAL
  Filled 2016-03-27: qty 1

## 2016-03-27 MED ORDER — ACETAMINOPHEN 500 MG PO TABS
1000.0000 mg | ORAL_TABLET | ORAL | Status: AC
  Administered 2016-03-27: 1000 mg via ORAL
  Filled 2016-03-27: qty 2

## 2016-03-27 MED ORDER — BUPIVACAINE LIPOSOME 1.3 % IJ SUSP
20.0000 mL | Freq: Once | INTRAMUSCULAR | Status: AC
  Administered 2016-03-27: 20 mL
  Filled 2016-03-27: qty 20

## 2016-03-27 MED ORDER — SUCCINYLCHOLINE CHLORIDE 200 MG/10ML IV SOSY
PREFILLED_SYRINGE | INTRAVENOUS | Status: DC | PRN
  Administered 2016-03-27: 120 mg via INTRAVENOUS

## 2016-03-27 MED ORDER — VALSARTAN-HYDROCHLOROTHIAZIDE 320-25 MG PO TABS: 1.0000 | ORAL_TABLET | Freq: Every day | ORAL | Status: DC

## 2016-03-27 MED ORDER — CHLORHEXIDINE GLUCONATE CLOTH 2 % EX PADS: 6.0000 | MEDICATED_PAD | Freq: Once | CUTANEOUS | Status: DC

## 2016-03-27 MED ORDER — ONDANSETRON HCL 4 MG/2ML IJ SOLN: 4.0000 mg | Freq: Four times a day (QID) | INTRAMUSCULAR | Status: DC | PRN

## 2016-03-27 MED ORDER — LACTATED RINGERS IV SOLN
INTRAVENOUS | Status: DC
  Administered 2016-03-27: 1000 mL via INTRAVENOUS

## 2016-03-27 MED ORDER — HYDROCODONE-ACETAMINOPHEN 5-325 MG PO TABS: 1.0000 | ORAL_TABLET | ORAL | Status: DC | PRN

## 2016-03-27 MED ORDER — GABAPENTIN 300 MG PO CAPS
300.0000 mg | ORAL_CAPSULE | ORAL | Status: AC
  Administered 2016-03-27: 300 mg via ORAL
  Filled 2016-03-27: qty 1

## 2016-03-27 MED ORDER — FENTANYL CITRATE (PF) 250 MCG/5ML IJ SOLN
INTRAMUSCULAR | Status: DC | PRN
  Administered 2016-03-27: 100 ug via INTRAVENOUS
  Administered 2016-03-27: 50 ug via INTRAVENOUS

## 2016-03-27 MED ORDER — IBUPROFEN 200 MG PO TABS: 600.0000 mg | ORAL_TABLET | Freq: Four times a day (QID) | ORAL | Status: DC | PRN

## 2016-03-27 MED ORDER — DEXAMETHASONE SODIUM PHOSPHATE 10 MG/ML IJ SOLN
INTRAMUSCULAR | Status: DC | PRN
  Administered 2016-03-27: 10 mg via INTRAVENOUS

## 2016-03-27 MED ORDER — LIDOCAINE 2% (20 MG/ML) 5 ML SYRINGE
INTRAMUSCULAR | Status: AC
  Filled 2016-03-27: qty 5

## 2016-03-27 SURGICAL SUPPLY — 44 items
ADH SKN CLS APL DERMABOND .7 (GAUZE/BANDAGES/DRESSINGS) ×1
APL SKNCLS STERI-STRIP NONHPOA (GAUZE/BANDAGES/DRESSINGS)
BENZOIN TINCTURE PRP APPL 2/3 (GAUZE/BANDAGES/DRESSINGS) IMPLANT
BINDER ABDOMINAL 12 ML 46-62 (SOFTGOODS) ×3 IMPLANT
CHLORAPREP W/TINT 26ML (MISCELLANEOUS) ×3 IMPLANT
CLOSURE WOUND 1/4X4 (GAUZE/BANDAGES/DRESSINGS)
COVER SURGICAL LIGHT HANDLE (MISCELLANEOUS) ×2 IMPLANT
DECANTER SPIKE VIAL GLASS SM (MISCELLANEOUS) ×3 IMPLANT
DERMABOND ADVANCED (GAUZE/BANDAGES/DRESSINGS) ×2
DERMABOND ADVANCED .7 DNX12 (GAUZE/BANDAGES/DRESSINGS) IMPLANT
DEVICE SECURE STRAP 25 ABSORB (INSTRUMENTS) ×4 IMPLANT
DEVICE TROCAR PUNCTURE CLOSURE (ENDOMECHANICALS) ×3 IMPLANT
DRAPE INCISE IOBAN 66X45 STRL (DRAPES) ×3 IMPLANT
ELECT REM PT RETURN 9FT ADLT (ELECTROSURGICAL) ×3
ELECTRODE REM PT RTRN 9FT ADLT (ELECTROSURGICAL) ×1 IMPLANT
GLOVE SURG SIGNA 7.5 PF LTX (GLOVE) ×13 IMPLANT
GOWN STRL REUS W/TWL XL LVL3 (GOWN DISPOSABLE) ×9 IMPLANT
IRRIG SUCT STRYKERFLOW 2 WTIP (MISCELLANEOUS) ×3
IRRIGATION SUCT STRKRFLW 2 WTP (MISCELLANEOUS) IMPLANT
KIT BASIN OR (CUSTOM PROCEDURE TRAY) ×3 IMPLANT
MARKER SKIN DUAL TIP RULER LAB (MISCELLANEOUS) ×3 IMPLANT
MESH PARIETEX 20X15 (Mesh General) ×2 IMPLANT
NDL SPNL 22GX3.5 QUINCKE BK (NEEDLE) ×1 IMPLANT
NEEDLE SPNL 22GX3.5 QUINCKE BK (NEEDLE) ×3 IMPLANT
PAD POSITIONING PINK XL (MISCELLANEOUS) IMPLANT
POSITIONER SURGICAL ARM (MISCELLANEOUS) IMPLANT
SCISSORS LAP 5X35 DISP (ENDOMECHANICALS) ×3 IMPLANT
SHEARS HARMONIC ACE PLUS 36CM (ENDOMECHANICALS) ×2 IMPLANT
SLEEVE ADV FIXATION 5X100MM (TROCAR) IMPLANT
STAPLER VISISTAT 35W (STAPLE) IMPLANT
STRIP CLOSURE SKIN 1/4X4 (GAUZE/BANDAGES/DRESSINGS) IMPLANT
SUT MNCRL AB 4-0 PS2 18 (SUTURE) ×3 IMPLANT
SUT NOVA 0 T19/GS 22DT (SUTURE) ×4 IMPLANT
SUT NOVA NAB DX-16 0-1 5-0 T12 (SUTURE) IMPLANT
TACKER 5MM HERNIA 3.5CML NAB (ENDOMECHANICALS) IMPLANT
TAPE CLOTH 4X10 WHT NS (GAUZE/BANDAGES/DRESSINGS) ×2 IMPLANT
TOWEL OR 17X26 10 PK STRL BLUE (TOWEL DISPOSABLE) ×3 IMPLANT
TRAY FOLEY W/METER SILVER 16FR (SET/KITS/TRAYS/PACK) ×1 IMPLANT
TRAY LAPAROSCOPIC (CUSTOM PROCEDURE TRAY) ×3 IMPLANT
TROCAR ADV FIXATION 5X100MM (TROCAR) ×2 IMPLANT
TROCAR BLADELESS OPT 5 100 (ENDOMECHANICALS) IMPLANT
TROCAR HASSON 5MM (TROCAR) ×2 IMPLANT
TROCAR XCEL NON-BLD 11X100MML (ENDOMECHANICALS) IMPLANT
TUBING INSUF HEATED (TUBING) ×2 IMPLANT

## 2016-03-27 NOTE — Interval H&P Note (Signed)
History and Physical Interval Note:  03/27/2016 2:00 PM  Thomas [name]  has presented today for surgery, with the diagnosis of ventral hernia  The various methods of treatment have been discussed with the patient and family.  His wife, [name], is with him.  After consideration of risks, benefits and other options for treatment, the patient has consented to  Procedure(s): LAPAROSCOPIC VENTRAL HERNIA (N/A) as a surgical intervention .  The patient's history has been reviewed, patient examined, no change in status, stable for surgery.  I have reviewed the patient's chart and labs.  Questions were answered to the patient's satisfaction.     [name] H

## 2016-03-27 NOTE — Anesthesia Procedure Notes (Signed)
Procedure Name: Intubation Date/Time: 03/27/2016 2:31 PM Performed by: Dione Booze Pre-anesthesia Checklist: Emergency Drugs available, Suction available, Patient being monitored and Patient identified Patient Re-evaluated:Patient Re-evaluated prior to inductionOxygen Delivery Method: Circle system utilized Preoxygenation: Pre-oxygenation with 100% oxygen Intubation Type: IV induction Laryngoscope Size: Mac and 4 Grade View: Grade I Tube type: Oral Tube size: 7.5 mm Number of attempts: 1 Airway Equipment and Method: Stylet Placement Confirmation: ETT inserted through vocal cords under direct vision,  positive ETCO2 and breath sounds checked- equal and bilateral Secured at: 22 cm Tube secured with: Tape Dental Injury: Teeth and Oropharynx as per pre-operative assessment

## 2016-03-27 NOTE — Op Note (Addendum)
OPERATIVE NOTE  03/27/2016  6:31 PM  PATIENT:  Thomas [name], 61 y.o., male, MRN: [id number]  PREOP DIAGNOSIS:  Ventral incisional hernia  POSTOP DIAGNOSIS:   Ventral incisional hernia (5 x 8 cm)  PROCEDURE:   Procedure(s):LAPAROSCOPIC VENTRAL HERNIA Repair [photos in the chart]  SURGEON:   Alphonsa Overall, M.D.  ASSISTANT:   None  ANESTHESIA:   general  Anesthesiologist: Belinda Block, MD CRNA: Dione Booze, CRNA  General  EBL:  minimal  ml  BLOOD ADMINISTERED: none  DRAINS: none   LOCAL MEDICATIONS USED:   20 cc Exparel + 20 cc 1/4% marcaine  SPECIMEN:   none  COUNTS CORRECT:  YES  INDICATIONS FOR PROCEDURE:  Thomas [name] is a 61 y.o. (DOB: Jan 12, 1956) white male whose primary care physician is GATES,ROBERT NEVILL, MD and comes for repair of ventral incisional hernia.   He had a robotic prostatectomy by Dr. Alinda Money in March 2016.  He has developed a hernia at the port site above the umbilicus.   The indications and risks of the surgery were explained to the patient.  The risks include, but are not limited to, infection, bleeding, and nerve injury.  OPERATIVE NOTE:  The patient was taken to OR room 4 at [facility].  He underwent a general anesthesia.  He was given 3 grams of Ancef at the beginning of the operation.   A time out was held and the surgical checklist run.   The abdomen was prepped with Cholroprep and sterilely draped.  I covered the abdomen with a Ioban drape.   I accessed the LUQ with a 5 mm trocar.  I upsized this trocar to a 10 mm trocar to put in the mesh.  An additional 5 mm trocar was placed in the left mid abdomen.   He had a 5 x 8 cm defect above the umbilicus at the trocar scar.   Then I placed a 15 x 20 cm Parietex mesh in the abdomen.  It had 8 holding sutures of 0 Novafil.  These were tied down to secure the mesh to the anterior abdominal wall.  I placed two additional transfascial 0 Novafil sutures for a total of 10 transfascial sutures.     I then used 44 Securestrap tacks to tack the mesh to the anterior abdominal wall.   The abdomen was deflated.  The mesh was inspected and there were no defects around the edge.  Photos were taken of the mesh and placed in the chart.   The trocars were then removed.  There was no bleeding at the trocar sites.  The incisions were closed with 4-0 Monocryl and painted with DermaBond. The puncture sites were painted with DermaBond.   The sponge and needle count were correct.  The patient had an abdominal binder placed.  The patient was transferred to the recovery room in good condition.    Type of repair - mesh  (choices - primary suture, mesh, or component)  Name of mesh - Parietex  Size of mesh - Length 20 cm, Width 15 cm  Mesh overlap - 6 cm  Placement of mesh - beneath fascia and into peritoneal cavity  (choices - beneath fascia and into peritoneal cavity, beneath fascia but external to peritoneal cavity, between the muscle and fascia, above or external to fascia)  Alphonsa Overall, MD, [facility] Surgery Pager: [phone] Office phone:  [phone]

## 2016-03-27 NOTE — Transfer of Care (Signed)
Immediate Anesthesia Transfer of Care Note  Patient: Thomas [name]  Procedure(s) Performed: Procedure(s): LAPAROSCOPIC VENTRAL HERNIA (N/A)  Patient Location: PACU  Anesthesia Type:General  Level of Consciousness: awake, alert , oriented and patient cooperative  Airway & Oxygen Therapy: Patient Spontanous Breathing and Patient connected to face mask oxygen  Post-op Assessment: Report given to RN and Post -op Vital signs reviewed and stable  Post vital signs: Reviewed and stable  Last Vitals:  Vitals:   03/27/16 1137 03/27/16 1600  BP: (!) 149/99   Pulse: 65   Resp: 18 (P) 12  Temp: 36.9 C     Last Pain:  Vitals:   03/27/16 1137  TempSrc: Oral         Complications: No apparent anesthesia complications

## 2016-03-27 NOTE — Anesthesia Preprocedure Evaluation (Addendum)
Anesthesia Evaluation  Patient identified by MRN, date of birth, ID band Patient awake    Reviewed: Allergy & Precautions, NPO status , Patient's Chart, lab work & pertinent test results  Airway Mallampati: II  TM Distance: >3 FB     Dental   Pulmonary pneumonia,    breath sounds clear to auscultation       Cardiovascular hypertension,  Rhythm:Regular Rate:Normal     Neuro/Psych    GI/Hepatic Neg liver ROS, GERD  ,  Endo/Other  negative endocrine ROS  Renal/GU negative Renal ROS     Musculoskeletal  (+) Arthritis ,   Abdominal   Peds  Hematology  (+) anemia ,   Anesthesia Other Findings   Reproductive/Obstetrics                            Anesthesia Physical Anesthesia Plan  ASA: III  Anesthesia Plan: General   Post-op Pain Management:    Induction: Intravenous  Airway Management Planned: Oral ETT  Additional Equipment:   Intra-op Plan:   Post-operative Plan: Possible Post-op intubation/ventilation  Informed Consent: I have reviewed the patients History and Physical, chart, labs and discussed the procedure including the risks, benefits and alternatives for the proposed anesthesia with the patient or authorized representative who has indicated his/her understanding and acceptance.   Dental advisory given  Plan Discussed with: CRNA and Anesthesiologist  Anesthesia Plan Comments:        Anesthesia Quick Evaluation

## 2016-03-27 NOTE — Anesthesia Postprocedure Evaluation (Signed)
Anesthesia Post Note  Patient: Thomas [name]  Procedure(s) Performed: Procedure(s) (LRB): LAPAROSCOPIC VENTRAL HERNIA (N/A)  Patient location during evaluation: PACU Anesthesia Type: General Level of consciousness: awake Pain management: pain level controlled Vital Signs Assessment: post-procedure vital signs reviewed and stable Respiratory status: spontaneous breathing Cardiovascular status: stable Anesthetic complications: no       Last Vitals:  Vitals:   03/27/16 1615 03/27/16 1630  BP: 138/89 119/86  Pulse: 70 63  Resp: 14 13  Temp:      Last Pain:  Vitals:   03/27/16 1600  TempSrc:   PainSc: 0-No pain                 Avonda Toso

## 2016-03-28 DIAGNOSIS — E785 Hyperlipidemia, unspecified: Secondary | ICD-10-CM | POA: Diagnosis not present

## 2016-03-28 DIAGNOSIS — K432 Incisional hernia without obstruction or gangrene: Secondary | ICD-10-CM | POA: Diagnosis not present

## 2016-03-28 DIAGNOSIS — I10 Essential (primary) hypertension: Secondary | ICD-10-CM | POA: Diagnosis not present

## 2016-03-28 MED ORDER — HYDROCODONE-ACETAMINOPHEN 5-325 MG PO TABS: 1.0000 | ORAL_TABLET | ORAL | 0 refills | Status: DC | PRN

## 2016-03-28 NOTE — Discharge Instructions (Signed)
[facility] [place] [facility] - DISCHARGE INSTRUCTIONS TO PATIENT  Return to work on:  1 to 2 weeks  Activity:  Driving - May drive in 3 or 4 days, if doing well and off pain meds   Lifting - No lifting more than 15 pounds for 1 month  Wound Care:   Leave dressings on until Sunday, 04/08/2016 - then may shower         Wear abdominal binder during the day  Diet:  As tolerated  Follow up appointment:  Call Dr. Pollie Friar office [facility]) at [phone] for an appointment in 2 to 3 weeks.  Medications and dosages:  Resume your home medications.  You have a prescription for:  Vicodin  Call Dr. Lucia Gaskins or his office  [phone]) if you have:  Temperature greater than 100.4,  Persistent nausea and vomiting,  Severe uncontrolled pain,  Redness, tenderness, or signs of infection (pain, swelling, redness, odor or green/yellow discharge around the site),  Any other questions or concerns you may have after discharge.  In an emergency, call 911 or go to an Emergency Department at a nearby hospital.

## 2016-03-28 NOTE — Discharge Summary (Signed)
Physician Discharge Summary  Patient ID: Thomas [name] MRN: [id number] DOB/AGE: [birth year] 61 y.o.  Admit date: 03/27/2016 Discharge date: 03/28/2016  Admission Diagnoses: hernia  Discharge Diagnoses:  Active Problems:   Ventral hernia without obstruction or gangrene   Discharged Condition: good  Hospital Course: Pt admitted overnight after surgery.   Consults: None  Significant Diagnostic Studies: none  Treatments: IV hydration and surgery: hernia repair  Discharge Exam: Blood pressure 111/72, pulse (!) 58, temperature 97.9 F (36.6 C), temperature source Oral, resp. rate 18, height 5\' 11"  (1.803 m), weight 131.5 kg (290 lb), SpO2 94 %. General appearance: alert and cooperative GI: soft, nontender Incision/Wound: clean, dry intact  Disposition: 01-Home or Self Care   Allergies as of 03/28/2016      Reactions   Amoxicillin Diarrhea   Upset stomachs      Medication List    TAKE these medications   escitalopram 20 MG tablet Commonly known as:  LEXAPRO Take 20 mg by mouth at bedtime.   HYDROcodone-acetaminophen 5-325 MG tablet Commonly known as:  NORCO/VICODIN Take 1-2 tablets by mouth every 4 (four) hours as needed for moderate pain.   multivitamin with minerals tablet Take 1 tablet by mouth daily.   simvastatin 20 MG tablet Commonly known as:  ZOCOR Take 20 mg by mouth every evening.   valsartan-hydrochlorothiazide 320-25 MG tablet Commonly known as:  DIOVAN-HCT Take 1 tablet by mouth at bedtime.   Vitamin D 2000 units tablet Take 2,000 Units by mouth daily.      Follow-up Information    NEWMAN,DAVID H, MD. Schedule an appointment as soon as possible for a visit in 3 week(s).   Specialty:  General Surgery Contact information: [address] [place] [address] [phone]           Signed: Rosario Adie [id number], [id number] AM

## 2016-03-30 ENCOUNTER — Encounter (HOSPITAL_COMMUNITY): Payer: Self-pay | Admitting: Surgery

## 2016-04-03 DIAGNOSIS — F331 Major depressive disorder, recurrent, moderate: Secondary | ICD-10-CM | POA: Diagnosis not present

## 2016-04-17 DIAGNOSIS — F331 Major depressive disorder, recurrent, moderate: Secondary | ICD-10-CM | POA: Diagnosis not present

## 2016-04-21 DIAGNOSIS — I10 Essential (primary) hypertension: Secondary | ICD-10-CM | POA: Diagnosis not present

## 2016-04-21 DIAGNOSIS — E782 Mixed hyperlipidemia: Secondary | ICD-10-CM | POA: Diagnosis not present

## 2016-04-21 DIAGNOSIS — K439 Ventral hernia without obstruction or gangrene: Secondary | ICD-10-CM | POA: Diagnosis not present

## 2016-05-01 DIAGNOSIS — F331 Major depressive disorder, recurrent, moderate: Secondary | ICD-10-CM | POA: Diagnosis not present

## 2016-05-13 DIAGNOSIS — F329 Major depressive disorder, single episode, unspecified: Secondary | ICD-10-CM | POA: Diagnosis not present

## 2016-05-14 DIAGNOSIS — F331 Major depressive disorder, recurrent, moderate: Secondary | ICD-10-CM | POA: Diagnosis not present

## 2016-06-02 DIAGNOSIS — C61 Malignant neoplasm of prostate: Secondary | ICD-10-CM | POA: Diagnosis not present

## 2016-06-09 DIAGNOSIS — C61 Malignant neoplasm of prostate: Secondary | ICD-10-CM | POA: Diagnosis not present

## 2016-06-09 DIAGNOSIS — N5201 Erectile dysfunction due to arterial insufficiency: Secondary | ICD-10-CM | POA: Diagnosis not present

## 2016-06-12 DIAGNOSIS — F331 Major depressive disorder, recurrent, moderate: Secondary | ICD-10-CM | POA: Diagnosis not present

## 2016-06-26 DIAGNOSIS — Z79899 Other long term (current) drug therapy: Secondary | ICD-10-CM | POA: Diagnosis not present

## 2016-06-26 DIAGNOSIS — Z6838 Body mass index (BMI) 38.0-38.9, adult: Secondary | ICD-10-CM | POA: Diagnosis not present

## 2016-06-26 DIAGNOSIS — Z0000 Encounter for general adult medical examination without abnormal findings: Secondary | ICD-10-CM | POA: Diagnosis not present

## 2016-06-26 DIAGNOSIS — E559 Vitamin D deficiency, unspecified: Secondary | ICD-10-CM | POA: Diagnosis not present

## 2016-06-26 DIAGNOSIS — E782 Mixed hyperlipidemia: Secondary | ICD-10-CM | POA: Diagnosis not present

## 2016-06-26 DIAGNOSIS — I10 Essential (primary) hypertension: Secondary | ICD-10-CM | POA: Diagnosis not present

## 2016-06-26 DIAGNOSIS — F39 Unspecified mood [affective] disorder: Secondary | ICD-10-CM | POA: Diagnosis not present

## 2016-06-26 DIAGNOSIS — F331 Major depressive disorder, recurrent, moderate: Secondary | ICD-10-CM | POA: Diagnosis not present

## 2016-07-09 DIAGNOSIS — F331 Major depressive disorder, recurrent, moderate: Secondary | ICD-10-CM | POA: Diagnosis not present

## 2016-07-10 DIAGNOSIS — F331 Major depressive disorder, recurrent, moderate: Secondary | ICD-10-CM | POA: Diagnosis not present

## 2016-07-31 DIAGNOSIS — F331 Major depressive disorder, recurrent, moderate: Secondary | ICD-10-CM | POA: Diagnosis not present

## 2016-08-14 DIAGNOSIS — F331 Major depressive disorder, recurrent, moderate: Secondary | ICD-10-CM | POA: Diagnosis not present

## 2016-09-07 DIAGNOSIS — F331 Major depressive disorder, recurrent, moderate: Secondary | ICD-10-CM | POA: Diagnosis not present

## 2016-09-25 DIAGNOSIS — F331 Major depressive disorder, recurrent, moderate: Secondary | ICD-10-CM | POA: Diagnosis not present

## 2016-10-09 DIAGNOSIS — F331 Major depressive disorder, recurrent, moderate: Secondary | ICD-10-CM | POA: Diagnosis not present

## 2016-10-14 DIAGNOSIS — F329 Major depressive disorder, single episode, unspecified: Secondary | ICD-10-CM | POA: Diagnosis not present

## 2016-10-27 DIAGNOSIS — M5137 Other intervertebral disc degeneration, lumbosacral region: Secondary | ICD-10-CM | POA: Diagnosis not present

## 2016-10-27 DIAGNOSIS — M9904 Segmental and somatic dysfunction of sacral region: Secondary | ICD-10-CM | POA: Diagnosis not present

## 2016-10-27 DIAGNOSIS — M5136 Other intervertebral disc degeneration, lumbar region: Secondary | ICD-10-CM | POA: Diagnosis not present

## 2016-10-27 DIAGNOSIS — M9903 Segmental and somatic dysfunction of lumbar region: Secondary | ICD-10-CM | POA: Diagnosis not present

## 2016-10-29 DIAGNOSIS — F331 Major depressive disorder, recurrent, moderate: Secondary | ICD-10-CM | POA: Diagnosis not present

## 2016-11-02 DIAGNOSIS — M9904 Segmental and somatic dysfunction of sacral region: Secondary | ICD-10-CM | POA: Diagnosis not present

## 2016-11-02 DIAGNOSIS — M5137 Other intervertebral disc degeneration, lumbosacral region: Secondary | ICD-10-CM | POA: Diagnosis not present

## 2016-11-02 DIAGNOSIS — M5136 Other intervertebral disc degeneration, lumbar region: Secondary | ICD-10-CM | POA: Diagnosis not present

## 2016-11-02 DIAGNOSIS — M9903 Segmental and somatic dysfunction of lumbar region: Secondary | ICD-10-CM | POA: Diagnosis not present

## 2016-11-03 DIAGNOSIS — M5137 Other intervertebral disc degeneration, lumbosacral region: Secondary | ICD-10-CM | POA: Diagnosis not present

## 2016-11-03 DIAGNOSIS — M9903 Segmental and somatic dysfunction of lumbar region: Secondary | ICD-10-CM | POA: Diagnosis not present

## 2016-11-03 DIAGNOSIS — M9904 Segmental and somatic dysfunction of sacral region: Secondary | ICD-10-CM | POA: Diagnosis not present

## 2016-11-03 DIAGNOSIS — M5136 Other intervertebral disc degeneration, lumbar region: Secondary | ICD-10-CM | POA: Diagnosis not present

## 2016-11-05 DIAGNOSIS — M9904 Segmental and somatic dysfunction of sacral region: Secondary | ICD-10-CM | POA: Diagnosis not present

## 2016-11-05 DIAGNOSIS — M5137 Other intervertebral disc degeneration, lumbosacral region: Secondary | ICD-10-CM | POA: Diagnosis not present

## 2016-11-05 DIAGNOSIS — M9903 Segmental and somatic dysfunction of lumbar region: Secondary | ICD-10-CM | POA: Diagnosis not present

## 2016-11-05 DIAGNOSIS — M5136 Other intervertebral disc degeneration, lumbar region: Secondary | ICD-10-CM | POA: Diagnosis not present

## 2016-11-09 DIAGNOSIS — M5136 Other intervertebral disc degeneration, lumbar region: Secondary | ICD-10-CM | POA: Diagnosis not present

## 2016-11-09 DIAGNOSIS — M5137 Other intervertebral disc degeneration, lumbosacral region: Secondary | ICD-10-CM | POA: Diagnosis not present

## 2016-11-09 DIAGNOSIS — M9904 Segmental and somatic dysfunction of sacral region: Secondary | ICD-10-CM | POA: Diagnosis not present

## 2016-11-09 DIAGNOSIS — M9903 Segmental and somatic dysfunction of lumbar region: Secondary | ICD-10-CM | POA: Diagnosis not present

## 2016-11-10 DIAGNOSIS — M5136 Other intervertebral disc degeneration, lumbar region: Secondary | ICD-10-CM | POA: Diagnosis not present

## 2016-11-10 DIAGNOSIS — M9904 Segmental and somatic dysfunction of sacral region: Secondary | ICD-10-CM | POA: Diagnosis not present

## 2016-11-10 DIAGNOSIS — M9903 Segmental and somatic dysfunction of lumbar region: Secondary | ICD-10-CM | POA: Diagnosis not present

## 2016-11-10 DIAGNOSIS — M5137 Other intervertebral disc degeneration, lumbosacral region: Secondary | ICD-10-CM | POA: Diagnosis not present

## 2016-11-12 DIAGNOSIS — M9903 Segmental and somatic dysfunction of lumbar region: Secondary | ICD-10-CM | POA: Diagnosis not present

## 2016-11-12 DIAGNOSIS — M9904 Segmental and somatic dysfunction of sacral region: Secondary | ICD-10-CM | POA: Diagnosis not present

## 2016-11-12 DIAGNOSIS — M5136 Other intervertebral disc degeneration, lumbar region: Secondary | ICD-10-CM | POA: Diagnosis not present

## 2016-11-12 DIAGNOSIS — M5137 Other intervertebral disc degeneration, lumbosacral region: Secondary | ICD-10-CM | POA: Diagnosis not present

## 2016-11-17 DIAGNOSIS — M5136 Other intervertebral disc degeneration, lumbar region: Secondary | ICD-10-CM | POA: Diagnosis not present

## 2016-11-17 DIAGNOSIS — M9904 Segmental and somatic dysfunction of sacral region: Secondary | ICD-10-CM | POA: Diagnosis not present

## 2016-11-17 DIAGNOSIS — M9903 Segmental and somatic dysfunction of lumbar region: Secondary | ICD-10-CM | POA: Diagnosis not present

## 2016-11-17 DIAGNOSIS — M5137 Other intervertebral disc degeneration, lumbosacral region: Secondary | ICD-10-CM | POA: Diagnosis not present

## 2016-11-19 DIAGNOSIS — M5137 Other intervertebral disc degeneration, lumbosacral region: Secondary | ICD-10-CM | POA: Diagnosis not present

## 2016-11-19 DIAGNOSIS — F331 Major depressive disorder, recurrent, moderate: Secondary | ICD-10-CM | POA: Diagnosis not present

## 2016-11-19 DIAGNOSIS — M9904 Segmental and somatic dysfunction of sacral region: Secondary | ICD-10-CM | POA: Diagnosis not present

## 2016-11-19 DIAGNOSIS — M9903 Segmental and somatic dysfunction of lumbar region: Secondary | ICD-10-CM | POA: Diagnosis not present

## 2016-11-19 DIAGNOSIS — M5136 Other intervertebral disc degeneration, lumbar region: Secondary | ICD-10-CM | POA: Diagnosis not present

## 2016-11-20 DIAGNOSIS — M9904 Segmental and somatic dysfunction of sacral region: Secondary | ICD-10-CM | POA: Diagnosis not present

## 2016-11-20 DIAGNOSIS — M9903 Segmental and somatic dysfunction of lumbar region: Secondary | ICD-10-CM | POA: Diagnosis not present

## 2016-11-20 DIAGNOSIS — M5136 Other intervertebral disc degeneration, lumbar region: Secondary | ICD-10-CM | POA: Diagnosis not present

## 2016-11-20 DIAGNOSIS — M5137 Other intervertebral disc degeneration, lumbosacral region: Secondary | ICD-10-CM | POA: Diagnosis not present

## 2016-11-23 DIAGNOSIS — M5137 Other intervertebral disc degeneration, lumbosacral region: Secondary | ICD-10-CM | POA: Diagnosis not present

## 2016-11-23 DIAGNOSIS — M9904 Segmental and somatic dysfunction of sacral region: Secondary | ICD-10-CM | POA: Diagnosis not present

## 2016-11-23 DIAGNOSIS — M9903 Segmental and somatic dysfunction of lumbar region: Secondary | ICD-10-CM | POA: Diagnosis not present

## 2016-11-23 DIAGNOSIS — M5136 Other intervertebral disc degeneration, lumbar region: Secondary | ICD-10-CM | POA: Diagnosis not present

## 2016-11-25 DIAGNOSIS — M5136 Other intervertebral disc degeneration, lumbar region: Secondary | ICD-10-CM | POA: Diagnosis not present

## 2016-11-25 DIAGNOSIS — M9903 Segmental and somatic dysfunction of lumbar region: Secondary | ICD-10-CM | POA: Diagnosis not present

## 2016-11-25 DIAGNOSIS — M5137 Other intervertebral disc degeneration, lumbosacral region: Secondary | ICD-10-CM | POA: Diagnosis not present

## 2016-11-25 DIAGNOSIS — M9904 Segmental and somatic dysfunction of sacral region: Secondary | ICD-10-CM | POA: Diagnosis not present

## 2016-11-26 DIAGNOSIS — M5136 Other intervertebral disc degeneration, lumbar region: Secondary | ICD-10-CM | POA: Diagnosis not present

## 2016-11-26 DIAGNOSIS — M9903 Segmental and somatic dysfunction of lumbar region: Secondary | ICD-10-CM | POA: Diagnosis not present

## 2016-11-26 DIAGNOSIS — M5137 Other intervertebral disc degeneration, lumbosacral region: Secondary | ICD-10-CM | POA: Diagnosis not present

## 2016-11-26 DIAGNOSIS — M9904 Segmental and somatic dysfunction of sacral region: Secondary | ICD-10-CM | POA: Diagnosis not present

## 2016-12-01 DIAGNOSIS — M5136 Other intervertebral disc degeneration, lumbar region: Secondary | ICD-10-CM | POA: Diagnosis not present

## 2016-12-01 DIAGNOSIS — M5137 Other intervertebral disc degeneration, lumbosacral region: Secondary | ICD-10-CM | POA: Diagnosis not present

## 2016-12-01 DIAGNOSIS — M9903 Segmental and somatic dysfunction of lumbar region: Secondary | ICD-10-CM | POA: Diagnosis not present

## 2016-12-01 DIAGNOSIS — M9904 Segmental and somatic dysfunction of sacral region: Secondary | ICD-10-CM | POA: Diagnosis not present

## 2016-12-03 DIAGNOSIS — M9903 Segmental and somatic dysfunction of lumbar region: Secondary | ICD-10-CM | POA: Diagnosis not present

## 2016-12-03 DIAGNOSIS — M9904 Segmental and somatic dysfunction of sacral region: Secondary | ICD-10-CM | POA: Diagnosis not present

## 2016-12-03 DIAGNOSIS — M5137 Other intervertebral disc degeneration, lumbosacral region: Secondary | ICD-10-CM | POA: Diagnosis not present

## 2016-12-03 DIAGNOSIS — M5136 Other intervertebral disc degeneration, lumbar region: Secondary | ICD-10-CM | POA: Diagnosis not present

## 2016-12-18 DIAGNOSIS — F331 Major depressive disorder, recurrent, moderate: Secondary | ICD-10-CM | POA: Diagnosis not present

## 2017-01-01 DIAGNOSIS — F331 Major depressive disorder, recurrent, moderate: Secondary | ICD-10-CM | POA: Diagnosis not present

## 2017-01-12 DIAGNOSIS — C61 Malignant neoplasm of prostate: Secondary | ICD-10-CM | POA: Diagnosis not present

## 2017-01-14 DIAGNOSIS — F331 Major depressive disorder, recurrent, moderate: Secondary | ICD-10-CM | POA: Diagnosis not present

## 2017-01-20 DIAGNOSIS — N5201 Erectile dysfunction due to arterial insufficiency: Secondary | ICD-10-CM | POA: Diagnosis not present

## 2017-01-20 DIAGNOSIS — C61 Malignant neoplasm of prostate: Secondary | ICD-10-CM | POA: Diagnosis not present

## 2017-01-22 DIAGNOSIS — F331 Major depressive disorder, recurrent, moderate: Secondary | ICD-10-CM | POA: Diagnosis not present

## 2017-01-27 DIAGNOSIS — F331 Major depressive disorder, recurrent, moderate: Secondary | ICD-10-CM | POA: Diagnosis not present

## 2017-02-12 DIAGNOSIS — F331 Major depressive disorder, recurrent, moderate: Secondary | ICD-10-CM | POA: Diagnosis not present

## 2017-02-26 DIAGNOSIS — F331 Major depressive disorder, recurrent, moderate: Secondary | ICD-10-CM | POA: Diagnosis not present

## 2017-03-12 DIAGNOSIS — F331 Major depressive disorder, recurrent, moderate: Secondary | ICD-10-CM | POA: Diagnosis not present

## 2017-03-26 DIAGNOSIS — F331 Major depressive disorder, recurrent, moderate: Secondary | ICD-10-CM | POA: Diagnosis not present

## 2017-04-09 DIAGNOSIS — F331 Major depressive disorder, recurrent, moderate: Secondary | ICD-10-CM | POA: Diagnosis not present

## 2017-04-21 DIAGNOSIS — F331 Major depressive disorder, recurrent, moderate: Secondary | ICD-10-CM | POA: Diagnosis not present

## 2017-05-12 DIAGNOSIS — F331 Major depressive disorder, recurrent, moderate: Secondary | ICD-10-CM | POA: Diagnosis not present

## 2017-06-01 DIAGNOSIS — F331 Major depressive disorder, recurrent, moderate: Secondary | ICD-10-CM | POA: Diagnosis not present

## 2017-06-03 DIAGNOSIS — J069 Acute upper respiratory infection, unspecified: Secondary | ICD-10-CM | POA: Diagnosis not present

## 2017-06-17 DIAGNOSIS — F331 Major depressive disorder, recurrent, moderate: Secondary | ICD-10-CM | POA: Diagnosis not present

## 2017-06-25 DIAGNOSIS — F331 Major depressive disorder, recurrent, moderate: Secondary | ICD-10-CM | POA: Diagnosis not present

## 2017-07-02 DIAGNOSIS — F331 Major depressive disorder, recurrent, moderate: Secondary | ICD-10-CM | POA: Diagnosis not present

## 2018-11-29 ENCOUNTER — Other Ambulatory Visit: Payer: Self-pay

## 2018-11-29 ENCOUNTER — Ambulatory Visit (HOSPITAL_COMMUNITY)
Admission: EM | Admit: 2018-11-29 | Discharge: 2018-11-29 | Disposition: A | Discharge status: Home / Self Care | Payer: BC Managed Care – PPO | Attending: Emergency Medicine | Admitting: Emergency Medicine

## 2018-11-29 ENCOUNTER — Encounter (HOSPITAL_COMMUNITY): Payer: Self-pay | Admitting: Emergency Medicine

## 2018-11-29 DIAGNOSIS — R3915 Urgency of urination: Secondary | ICD-10-CM | POA: Diagnosis present

## 2018-11-29 LAB — POCT URINALYSIS DIP (DEVICE)
Bilirubin Urine: NEGATIVE
Glucose, UA: NEGATIVE mg/dL
Hgb urine dipstick: NEGATIVE
Ketones, ur: NEGATIVE mg/dL
Leukocytes,Ua: NEGATIVE
Nitrite: NEGATIVE
Protein, ur: NEGATIVE mg/dL
Specific Gravity, Urine: >=1.03 (ref 1.005–1.030)
Urobilinogen, UA: 0.2 mg/dL (ref 0.0–1.0)
pH: 5 (ref 5.0–8.0)

## 2018-11-29 MED ORDER — PHENAZOPYRIDINE HCL 200 MG PO TABS: 200.0000 mg | ORAL_TABLET | Freq: Three times a day (TID) | ORAL | 0 refills | Status: DC | PRN

## 2018-11-29 NOTE — ED Provider Notes (Signed)
HPI  SUBJECTIVE:  Thomas [name] is a 63 y.o. male who presents with urinary urgency, frequency, suprapubic and penile pressure starting 4 days ago.  The pressure lasts minutes without any aggravating or alleviating factors.  He states that he is urinating small amounts at a time.  No dysuria, cloudy or odorous urine, hematuria.  No change in color of his urine.  No nausea, vomiting, fevers, abdominal pain.  No testicular or scrotal pain or swelling.  No penile rash, discharge.  Has not been sexually active in 4-1/2 years.  States that he drinks a lot of water.  He has never had symptoms like this before.  He has a past medical history of remote UTI, lower urinary tract symptoms, prostate cancer status post prostatectomy, hypertension.  No hip history of pyelonephritis, nephrolithiasis, urethritis, epididymitis, orchitis, diabetes.  PMD: Dr. Inda Merlin.  Urology: Dr. Raynelle Bring.   Past Medical History:  Diagnosis Date  . Anemia    hx of in 1992   . Anxiety   . Arthritis    lower back   . GERD (gastroesophageal reflux disease)    rarely   . History of anxiety   . Hyperlipemia   . Hypertension   . Lower urinary tract symptoms (LUTS)   . Pneumonia    hx x2 of pneumonia at age 29   . Prostate cancer ([place]) 03/22/14   Adenocarcinoma    Past Surgical History:  Procedure Laterality Date  . Epididymis Excision Left    Spermocele  . HEMORROIDECTOMY    . HERNIA REPAIR Bilateral 1967 and 1986   x2 open inguinal hernia repairs  . INCISION AND DRAINAGE OF WOUND  2017   Wound Infection Post OP  . LYMPHADENECTOMY Bilateral 05/17/2014   Procedure: PELVIC LYMPHADENECTOMY;  Surgeon: Raynelle Bring, MD;  Location: [facility];  Service: Urology;  Laterality: Bilateral;  . PROSTATE BIOPSY  03/22/14   Raynelle Bring  . ROBOT ASSISTED LAPAROSCOPIC RADICAL PROSTATECTOMY N/A 05/17/2014   Procedure: ROBOTIC ASSISTED LAPAROSCOPIC RADICAL PROSTATECTOMY LEVEL 2;  Surgeon: Raynelle Bring, MD;  Location: [facility];  Service:  Urology;  Laterality: N/A;  . SKIN TAG REMOVAL     15 lesions  . VASECTOMY    . VENTRAL HERNIA REPAIR N/A 03/27/2016   Procedure: LAPAROSCOPIC VENTRAL HERNIA;  Surgeon: Alphonsa Overall, MD;  Location: [facility];  Service: General;  Laterality: N/A;  WITH MESH    Family History  Problem Relation Age of Onset  . Heart attack Mother   . Cancer Mother   . COPD Mother   . Ovarian cancer Mother   . Heart failure Mother     Social History   Tobacco Use  . Smoking status: Never Smoker  . Smokeless tobacco: Never Used  Substance Use Topics  . Alcohol use: No    Alcohol/week: 0.0 standard drinks  . Drug use: No    No current facility-administered medications for this encounter.   Current Outpatient Medications:  .  Cholecalciferol (VITAMIN D) 2000 units tablet, Take 2,000 Units by mouth daily., Disp: , Rfl:  .  escitalopram (LEXAPRO) 20 MG tablet, Take 20 mg by mouth at bedtime. , Disp: , Rfl: 1 .  Multiple Vitamins-Minerals (MULTIVITAMIN WITH MINERALS) tablet, Take 1 tablet by mouth daily., Disp: , Rfl:  .  simvastatin (ZOCOR) 20 MG tablet, Take 20 mg by mouth every evening., Disp: , Rfl: 3 .  valsartan-hydrochlorothiazide (DIOVAN-HCT) 320-25 MG per tablet, Take 1 tablet by mouth at bedtime. , Disp: , Rfl:  12 .  phenazopyridine (PYRIDIUM) 200 MG tablet, Take 1 tablet (200 mg total) by mouth 3 (three) times daily as needed for pain., Disp: 6 tablet, Rfl: 0  No Active Allergies   ROS  As noted in HPI.   Physical Exam  BP (!) 144/87 (BP Location: Right Arm) Comment (BP Location): large cuff  Pulse (!) 58   Temp 97.6 F (36.4 C) (Oral)   Resp 20   SpO2 99%   Constitutional: Well developed, well nourished, no acute distress Eyes:  EOMI, conjunctiva normal bilaterally HENT: Normocephalic, atraumatic,mucus membranes moist Respiratory: Normal inspiratory effort Cardiovascular: Normal rate GI: nondistended.  Soft.  No suprapubic or flank tenderness. GU: Normal uncircumcised male,  no penile rash, discharge.  No balanitis.  Testes descended bilaterally, nontender, symmetric.  No scrotal erythema, edema.  No epididymal tenderness. skin: No rash, skin intact Musculoskeletal: no deformities Neurologic: Alert & oriented x 3, no focal neuro deficits Psychiatric: Speech and behavior appropriate   ED Course   Medications - No data to display  Orders Placed This Encounter  Procedures  . Urine culture    Standing Status:   Standing    Number of Occurrences:   1    Order Specific Question:   Patient immune status    Answer:   Normal  . POCT urinalysis dip (device)    Standing Status:   Standing    Number of Occurrences:   1    Results for orders placed or performed during the hospital encounter of 11/29/18 (from the past 24 hour(s))  POCT urinalysis dip (device)     Status: None   Collection Time: 11/29/18  3:11 PM  Result Value Ref Range   Glucose, UA NEGATIVE NEGATIVE mg/dL   Bilirubin Urine NEGATIVE NEGATIVE   Ketones, ur NEGATIVE NEGATIVE mg/dL   Specific Gravity, Urine >=1.030 1.005 - 1.030   Hgb urine dipstick NEGATIVE NEGATIVE   pH 5.0 5.0 - 8.0   Protein, ur NEGATIVE NEGATIVE mg/dL   Urobilinogen, UA 0.2 0.0 - 1.0 mg/dL   Nitrite NEGATIVE NEGATIVE   Leukocytes,Ua NEGATIVE NEGATIVE   No results found.  ED Clinical Impression  1. Urinary urgency      ED Assessment/Plan  Patient's urine is concentrated, but negative for UTI on urine dip.  However given his symptoms we will send him home with Pyridium for 2 days and send off a urine culture.  No evidence of of pyelonephritis.  doubt STD as he has not been sexually active in 4-1/2 years.  Discussed labs,  MDM, treatment plan, and plan for follow-up with patient. Discussed sn/sx that should prompt return to the ED. patient agrees with plan.   Meds ordered this encounter  Medications  . phenazopyridine (PYRIDIUM) 200 MG tablet    Sig: Take 1 tablet (200 mg total) by mouth 3 (three) times daily as  needed for pain.    Dispense:  6 tablet    Refill:  0    *This clinic note was created using [employer]. Therefore, there may be occasional mistakes despite careful proofreading.   ?    Melynda Ripple, MD 11/29/18 1626

## 2018-11-29 NOTE — ED Triage Notes (Signed)
Symptoms started Saturday.  Symptoms include: urinating frequently, penis pressure is painful.

## 2018-11-29 NOTE — ED Notes (Signed)
Labeled urine in lab

## 2018-11-29 NOTE — Discharge Instructions (Addendum)
Continue staying well-hydrated.  You are doing a good job with this.  Take the Pyridium, and this will stop that urinary urgency.  We will call you if your urine culture comes back positive for urinary tract infection.  We will call in the appropriate antibiotics at that time.  This will take several days.

## 2018-11-30 ENCOUNTER — Telehealth (HOSPITAL_COMMUNITY): Payer: Self-pay | Admitting: Emergency Medicine

## 2018-11-30 ENCOUNTER — Encounter (HOSPITAL_COMMUNITY): Payer: Self-pay

## 2018-11-30 LAB — URINE CULTURE
Culture: NO GROWTH
Special Requests: NORMAL

## 2018-11-30 NOTE — Telephone Encounter (Signed)
Pt called stating he wasn't feeling better after taking the pyridium, discussed with patient potential options, pt opted to wait for urine culture to come back, will call his urologist today to schedule appt anticipating that the culture will be negative. Will send work note to cover for a few days due to discomfort. Pt agreeable to plan, activating mychart so he can follow up with labs and work note.

## 2019-01-22 ENCOUNTER — Emergency Department (HOSPITAL_COMMUNITY): Payer: BC Managed Care – PPO

## 2019-01-22 ENCOUNTER — Encounter (HOSPITAL_COMMUNITY): Payer: Self-pay

## 2019-01-22 ENCOUNTER — Other Ambulatory Visit: Payer: Self-pay

## 2019-01-22 ENCOUNTER — Emergency Department (HOSPITAL_COMMUNITY)
Admission: EM | Admit: 2019-01-22 | Discharge: 2019-01-22 | Disposition: A | Discharge status: Home / Self Care | Payer: BC Managed Care – PPO | Attending: Emergency Medicine | Admitting: Emergency Medicine

## 2019-01-22 DIAGNOSIS — Z79899 Other long term (current) drug therapy: Secondary | ICD-10-CM | POA: Diagnosis not present

## 2019-01-22 DIAGNOSIS — Z8546 Personal history of malignant neoplasm of prostate: Secondary | ICD-10-CM | POA: Insufficient documentation

## 2019-01-22 DIAGNOSIS — R079 Chest pain, unspecified: Secondary | ICD-10-CM | POA: Diagnosis not present

## 2019-01-22 DIAGNOSIS — I10 Essential (primary) hypertension: Secondary | ICD-10-CM | POA: Diagnosis not present

## 2019-01-22 LAB — CBC
HCT: 47.8 % (ref 39.0–52.0)
Hemoglobin: 15.8 g/dL (ref 13.0–17.0)
MCH: 29.8 pg (ref 26.0–34.0)
MCHC: 33.1 g/dL (ref 30.0–36.0)
MCV: 90 fL (ref 80.0–100.0)
Platelets: 222 10*3/uL (ref 150–400)
RBC: 5.31 MIL/uL (ref 4.22–5.81)
RDW: 12.6 % (ref 11.5–15.5)
WBC: 6.4 10*3/uL (ref 4.0–10.5)
nRBC: 0 % (ref 0.0–0.2)

## 2019-01-22 LAB — TROPONIN I (HIGH SENSITIVITY)
Troponin I (High Sensitivity): 10 ng/L (ref <18)
Troponin I (High Sensitivity): 6 ng/L (ref <18)

## 2019-01-22 LAB — BASIC METABOLIC PANEL
Anion gap: 9 (ref 5–15)
BUN: 18 mg/dL (ref 8–23)
CO2: 27 mmol/L (ref 22–32)
Calcium: 10 mg/dL (ref 8.9–10.3)
Chloride: 103 mmol/L (ref 98–111)
Creatinine, Ser: 1.04 mg/dL (ref 0.61–1.24)
GFR calc Af Amer: >60 mL/min (ref >60)
GFR calc non Af Amer: >60 mL/min (ref >60)
Glucose, Bld: 95 mg/dL (ref 70–99)
Potassium: 3.8 mmol/L (ref 3.5–5.1)
Sodium: 139 mmol/L (ref 135–145)

## 2019-01-22 MED ORDER — SODIUM CHLORIDE 0.9% FLUSH: 3.0000 mL | Freq: Once | INTRAVENOUS | Status: DC

## 2019-01-22 NOTE — ED Notes (Signed)
RN answered all questions, pt and visitor verbalized understanding of discharge instructions and follow up instructions.

## 2019-01-22 NOTE — ED Triage Notes (Signed)
Patient complains of CP since Tuesday, reports that the pain is worse at night when he is supine. No associated symptoms, pain is described as a squeezing pain

## 2019-01-22 NOTE — ED Provider Notes (Signed)
[facility]PART[facility] Provider Note   CSN: [id number] Arrival date & time: 01/22/19  1427     History   Chief Complaint Chief Complaint  Patient presents with  . Chest Pain    HPI Thomas [name] is a 63 y.o. male.     HPI   Since Tuesday, has had chest pain, localized area to the left chest, worse laying down--well notices it laying down and always there then,  coming and going during the day, when moving around, picking things up for yard sale did not worsen.  4wk ago began to have pain on ulnar side of arm, worse with ulnar deviation. Does handy work. Left side of neck pain also hurting.  2 weeks ago was doing a lot of chopping, has not had any exertional chest pain.   Today was having chest pain and also dull headache Chest pain worse today,  Pain 4-5/10, between 6-7 now.  CP dull, every once in a while feels like pins in it. Dull, constant when it is there but does go away.  No associated shortness of breath, nausea, vomiting. Did have some clamminess this AM and on ED arrival but at no other time   Htn, hlpd No hx of DM or CAD  Fam hx: mom had MI around 67, Grandfather had MI at age 66   No smoking, etoh or other drugs  No long trips car/airplane/recent surgeries   Past Medical History:  Diagnosis Date  . Anemia    hx of in 1992   . Anxiety   . Arthritis    lower back   . GERD (gastroesophageal reflux disease)    rarely   . History of anxiety   . Hyperlipemia   . Hypertension   . Lower urinary tract symptoms (LUTS)   . Pneumonia    hx x2 of pneumonia at age 69   . 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 Ventral hernia without obstruction or gangrene 03/27/2016  . Prostate cancer ([place]) 05/17/2014  . Malignant neoplasm of prostate ([place]) 04/17/2014    Past Surgical History:  Procedure Laterality Date  . Epididymis Excision Left    Spermocele  .  HEMORROIDECTOMY    . HERNIA REPAIR Bilateral 1967 and 1986   x2 open inguinal hernia repairs  . INCISION AND DRAINAGE OF WOUND  2017   Wound Infection Post OP  . LYMPHADENECTOMY Bilateral 05/17/2014   Procedure: PELVIC LYMPHADENECTOMY;  Surgeon: Raynelle Bring, MD;  Location: [facility];  Service: Urology;  Laterality: Bilateral;  . PROSTATE BIOPSY  03/22/14   Raynelle Bring  . ROBOT ASSISTED LAPAROSCOPIC RADICAL PROSTATECTOMY N/A 05/17/2014   Procedure: ROBOTIC ASSISTED LAPAROSCOPIC RADICAL PROSTATECTOMY LEVEL 2;  Surgeon: Raynelle Bring, MD;  Location: [facility];  Service: Urology;  Laterality: N/A;  . SKIN TAG REMOVAL     15 lesions  . VASECTOMY    . VENTRAL HERNIA REPAIR N/A 03/27/2016   Procedure: LAPAROSCOPIC VENTRAL HERNIA;  Surgeon: Alphonsa Overall, MD;  Location: [facility];  Service: General;  Laterality: N/A;  WITH MESH        Home Medications    Prior to Admission medications   Medication Sig Start Date End Date Taking? Authorizing Provider  Cholecalciferol (VITAMIN D) 2000 units tablet Take 2,000 Units by mouth daily.   Yes [provider]  escitalopram (LEXAPRO) 20 MG tablet Take 20 mg by mouth every morning.  01/04/16  Yes [provider]  Multiple Vitamins-Minerals (MULTIVITAMIN WITH MINERALS) tablet Take 1 tablet by mouth daily.   Yes [provider]  simvastatin (ZOCOR) 20 MG tablet Take 20 mg by mouth every morning.  01/30/16  Yes [provider]  valsartan-hydrochlorothiazide (DIOVAN-HCT) 320-25 MG per tablet Take 1 tablet by mouth every morning.  03/27/14  Yes [provider]  phenazopyridine (PYRIDIUM) 200 MG tablet Take 1 tablet (200 mg total) by mouth 3 (three) times daily as needed for pain. 11/29/18   Melynda Ripple, MD    Family History Family History  Problem Relation Age of Onset  . Heart attack Mother   . Cancer Mother   . COPD Mother   . Ovarian cancer Mother   . Heart failure Mother     Social History Social History    Tobacco Use  . Smoking status: Never Smoker  . Smokeless tobacco: Never Used  Substance Use Topics  . Alcohol use: No    Alcohol/week: 0.0 standard drinks  . Drug use: No     Allergies   Patient has no active allergies.   Review of Systems Review of Systems  Constitutional: Negative for chills and fever.  HENT: Negative for sore throat.   Eyes: Negative for visual disturbance.  Respiratory: Negative for cough and shortness of breath.   Cardiovascular: Positive for chest pain. Negative for leg swelling.  Gastrointestinal: Negative for abdominal pain, diarrhea, nausea and vomiting.  Genitourinary: Negative for difficulty urinating.  Musculoskeletal: Negative for back pain and neck stiffness.  Skin: Negative for rash.  Neurological: Positive for headaches. Negative for syncope and numbness. Weakness: pain with ulnar deviation of left hand      Physical Exam Updated Vital Signs BP (!) 145/85   Pulse (!) 56   Temp 98.4 F (36.9 C) (Oral)   Resp 16   SpO2 96%    Physical Exam Vitals signs and nursing note reviewed.  Constitutional:      General: He is not in acute distress.    Appearance: He is well-developed. He is not diaphoretic.  HENT:     Head: Normocephalic and atraumatic.  Eyes:     Conjunctiva/sclera: Conjunctivae normal.  Neck:     Musculoskeletal: Normal range of motion.  Cardiovascular:     Rate and Rhythm: Normal rate and regular rhythm.     Heart sounds: Normal heart sounds. No murmur. No friction rub. No gallop.   Pulmonary:     Effort: Pulmonary effort is normal. No respiratory distress.     Breath sounds: Normal breath sounds. No wheezing or rales.  Abdominal:     General: There is no distension.     Palpations: Abdomen is soft.     Tenderness: There is no abdominal tenderness. There is no guarding.  Skin:    General: Skin is warm and dry.  Neurological:     Mental Status: He is alert and oriented to person, place, and time.      ED  Treatments / Results  Labs (all labs ordered are listed, but only abnormal results are displayed) Labs Reviewed  BASIC METABOLIC PANEL  CBC  TROPONIN I (HIGH SENSITIVITY)  TROPONIN I (HIGH SENSITIVITY)    EKG EKG Interpretation  Date/Time:  Sunday January 22 2019 14:30:53 EST Ventricular Rate:  64 PR Interval:  136 QRS Duration: 80 QT Interval:  408 QTC Calculation: 420 R Axis:   11 Text Interpretation: Normal sinus rhythm Nonspecific ST and T wave abnormality Abnormal ECG  No significant change since last tracing Confirmed by Gareth Morgan [phone]) on 01/22/2019 3:57:52 PM   Radiology Dg Chest 2 View  Result Date: 01/22/2019 CLINICAL DATA:  Pt complains of mid to left sided chest pain since Tuesday. No SOB out of the normal. No heart or lung diseases. No heart or lung surgeries. Nonsmoker. EXAM: CHEST - 2 VIEW COMPARISON:  Chest radiograph 05/03/2014 FINDINGS: Stable cardiomediastinal contours. There are a few small opacities at the left lung base likely atelectasis or scarring. The right lung is clear. No pneumothorax or pleural effusion. Multilevel degenerative changes in the thoracic spine. IMPRESSION: Minimal opacities at the left lung base likely atelectasis or scarring. No evidence of active disease. Electronically Signed   By: Audie Pinto M.D.   On: 01/22/2019 15:12    Procedures Procedures (including critical care time)  Medications Ordered in ED Medications - No data to display   Initial Impression / Assessment and Plan / ED Course  I have reviewed the triage vital signs and the nursing notes.  Pertinent labs & imaging results that were available during my care of the patient were reviewed by me and considered in my medical decision making (see chart for details).        Thomas [name] is a pleasant 63yo male with history of hypertension, hyperlipidemia, prostate cancer presents with concern for chest pain. Differential diagnosis for chest pain includes  pulmonary embolus, dissection, pneumothorax, pneumonia, ACS, myocarditis, pericarditis.  EKG was done and evaluate by me and showed no acute ST changes and no signs of pericarditis. Chest x-ray was done and evaluated by me and radiology and showed no sign of pneumonia or pneumothorax.  No dyspnea, hypoxia, tachycardia, tachypnea, no active Ca/recent surgery or travel and have low suspicion for PE.  Normal bilateral upper and lower extremity pulses, hx and exam not consistent with dissection.  Troponin negative x 2 without signs of ACS.  HEART score 4 with calculation, although discussed that he does have atypical symptoms given timing of pain, no exertional worsening, noticible mostly at night when laying down.   Discussed possibility of observation admission but given atypical symptoms feel he is also appropriate for close Cardiology follow up and pt and wife agree with this plan. Notified Cardiology of desire for close follow up and gave number for patient to call for appointment. Discussed reasons to return in detail.    Final Clinical Impressions(s) / ED Diagnoses   Final diagnoses:  Nonspecific chest pain    ED Discharge Orders    None       Gareth Morgan, MD 01/23/19 [id number]

## 2019-01-25 ENCOUNTER — Encounter: Payer: Self-pay | Admitting: *Deleted

## 2019-01-25 ENCOUNTER — Other Ambulatory Visit: Payer: Self-pay

## 2019-01-25 ENCOUNTER — Ambulatory Visit (INDEPENDENT_AMBULATORY_CARE_PROVIDER_SITE_OTHER): Payer: BC Managed Care – PPO | Admitting: Cardiology

## 2019-01-25 VITALS — BP 120/80 | HR 67 | Ht 71.0 in | Wt 272.0 lb

## 2019-01-25 DIAGNOSIS — M1990 Unspecified osteoarthritis, unspecified site: Secondary | ICD-10-CM | POA: Insufficient documentation

## 2019-01-25 DIAGNOSIS — F419 Anxiety disorder, unspecified: Secondary | ICD-10-CM | POA: Insufficient documentation

## 2019-01-25 DIAGNOSIS — Z8659 Personal history of other mental and behavioral disorders: Secondary | ICD-10-CM | POA: Insufficient documentation

## 2019-01-25 DIAGNOSIS — R011 Cardiac murmur, unspecified: Secondary | ICD-10-CM

## 2019-01-25 DIAGNOSIS — J189 Pneumonia, unspecified organism: Secondary | ICD-10-CM | POA: Insufficient documentation

## 2019-01-25 DIAGNOSIS — E782 Mixed hyperlipidemia: Secondary | ICD-10-CM | POA: Insufficient documentation

## 2019-01-25 DIAGNOSIS — R399 Unspecified symptoms and signs involving the genitourinary system: Secondary | ICD-10-CM | POA: Insufficient documentation

## 2019-01-25 DIAGNOSIS — I10 Essential (primary) hypertension: Secondary | ICD-10-CM | POA: Insufficient documentation

## 2019-01-25 DIAGNOSIS — E785 Hyperlipidemia, unspecified: Secondary | ICD-10-CM | POA: Insufficient documentation

## 2019-01-25 DIAGNOSIS — R079 Chest pain, unspecified: Secondary | ICD-10-CM | POA: Diagnosis not present

## 2019-01-25 DIAGNOSIS — D649 Anemia, unspecified: Secondary | ICD-10-CM | POA: Insufficient documentation

## 2019-01-25 DIAGNOSIS — K219 Gastro-esophageal reflux disease without esophagitis: Secondary | ICD-10-CM | POA: Insufficient documentation

## 2019-01-25 MED ORDER — METOPROLOL TARTRATE 100 MG PO TABS: 100.0000 mg | ORAL_TABLET | Freq: Two times a day (BID) | ORAL | 3 refills | Status: DC

## 2019-01-25 MED ORDER — METOPROLOL TARTRATE 100 MG PO TABS: ORAL_TABLET | ORAL | 0 refills | Status: DC

## 2019-01-25 NOTE — Progress Notes (Signed)
Cardiology Office Note:    Date:  01/25/2019   ID:  DESMON VICKNAIR, DOB 07-23-1955, MRN [id number]  PCP:  Josetta Huddle, MD  Cardiologist:  Berniece Salines, DO  Electrophysiologist:  None   Referring MD: Josetta Huddle, MD   Chief Complaint  Patient presents with  . Chest Pain    History of Present Illness:    Thomas [name] is a 63 y.o. male with a hx of hypertension, hyperlipidemia, Anxiety and obesity presents to be evaluated for chest pain.   The patient tells me that he has been experiencing chest pain. He describes it as left-sided chest pressure (a dull sensation) that radiates to his neck,jaw and shoulder at times.  He states that it last for few minutes prior to resolution.  The patient tells me that he usually notices the pain when he is laying down but states that is usually always there.  He denies any breath, nausea, vomiting.  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  Past Medical History:  Diagnosis Date  . Anemia    hx of in 1992   . Anxiety   . Arthritis    lower back   . GERD (gastroesophageal reflux disease)    rarely   . History of anxiety   . Hyperlipemia   . Hypertension   . Lower urinary tract symptoms (LUTS)   . Malignant neoplasm of prostate ([place]) 04/17/2014  . Pneumonia    hx x2 of pneumonia at age 87   . 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 Ventral hernia without obstruction or gangrene 03/27/2016    Past Surgical History:  Procedure Laterality Date  . Epididymis Excision Left    Spermocele  . HEMORROIDECTOMY    . HERNIA REPAIR Bilateral 1967 and 1986   x2 open inguinal hernia repairs  . INCISION AND DRAINAGE OF WOUND  2017   Wound Infection Post OP  . LYMPHADENECTOMY Bilateral 05/17/2014   Procedure: PELVIC LYMPHADENECTOMY;  Surgeon: Raynelle Bring, MD;  Location: [facility];  Service: Urology;  Laterality: Bilateral;  . PROSTATE  BIOPSY  03/22/14   Raynelle Bring  . ROBOT ASSISTED LAPAROSCOPIC RADICAL PROSTATECTOMY N/A 05/17/2014   Procedure: ROBOTIC ASSISTED LAPAROSCOPIC RADICAL PROSTATECTOMY LEVEL 2;  Surgeon: Raynelle Bring, MD;  Location: [facility];  Service: Urology;  Laterality: N/A;  . SKIN TAG REMOVAL     15 lesions  . VASECTOMY    . VENTRAL HERNIA REPAIR N/A 03/27/2016   Procedure: LAPAROSCOPIC VENTRAL HERNIA;  Surgeon: Alphonsa Overall, MD;  Location: [facility];  Service: General;  Laterality: N/A;  WITH MESH    Current Medications: Current Meds  Medication Sig  . Cholecalciferol (VITAMIN D) 2000 units tablet Take 2,000 Units by mouth daily.  Marland Kitchen escitalopram (LEXAPRO) 20 MG tablet Take 20 mg by mouth every morning.   . Multiple Vitamins-Minerals (MULTIVITAMIN WITH MINERALS) tablet Take 1 tablet by mouth daily.  . simvastatin (ZOCOR) 20 MG tablet Take 20 mg by mouth every morning.   . valsartan-hydrochlorothiazide (DIOVAN-HCT) 320-25 MG per tablet Take 1 tablet by mouth every morning.   . vitamin B-12 (CYANOCOBALAMIN) 500 MCG tablet Take 500 mcg by mouth daily.     Allergies:   Patient has no known allergies.   Social History   Socioeconomic History  . Marital status: Married    Spouse name: Not on file  . Number  of children: 2  . Years of education: Not on file  . Highest education level: Not on file  Occupational History  . Occupation: Press photographer  Social Needs  . Financial resource strain: Not on file  . Food insecurity    Worry: Not on file    Inability: Not on file  . Transportation needs    Medical: Not on file    Non-medical: Not on file  Tobacco Use  . Smoking status: Never Smoker  . Smokeless tobacco: Never Used  Substance and Sexual Activity  . Alcohol use: No    Alcohol/week: 0.0 standard drinks  . Drug use: No  . Sexual activity: Not on file  Lifestyle  . Physical activity    Days per week: Not on file    Minutes per session: Not on file  . Stress: Not on file  Relationships  . Social  Herbalist on phone: Not on file    Gets together: Not on file    Attends religious service: Not on file    Active member of club or organization: Not on file    Attends meetings of clubs or organizations: Not on file    Relationship status: Not on file  Other Topics Concern  . Not on file  Social History Narrative  . Not on file     Family History: The patient's family history includes COPD in his mother; Cancer in his mother; Heart attack in his mother and paternal grandfather; Heart failure in his mother; Ovarian cancer in his mother.  ROS:   Review of Systems  Constitution: Negative for decreased appetite, fever and weight gain.  HENT: Negative for congestion, ear discharge, hoarse voice and sore throat.   Eyes: Negative for discharge, redness, vision loss in right eye and visual halos.  Cardiovascular: Reports chest pain.  Negative for chest pain, leg swelling, orthopnea and palpitations.  Respiratory: Negative for cough, hemoptysis, shortness of breath and snoring.   Endocrine: Negative for heat intolerance and polyphagia.  Hematologic/Lymphatic: Negative for bleeding problem. Does not bruise/bleed easily.  Skin: Negative for flushing, nail changes, rash and suspicious lesions.  Musculoskeletal: Negative for arthritis, joint pain, muscle cramps, myalgias, neck pain and stiffness.  Gastrointestinal: Negative for abdominal pain, bowel incontinence, diarrhea and excessive appetite.  Genitourinary: Negative for decreased libido, genital sores and incomplete emptying.  Neurological: Negative for brief paralysis, focal weakness, headaches and loss of balance.  Psychiatric/Behavioral: Negative for altered mental status, depression and suicidal ideas.  Allergic/Immunologic: Negative for HIV exposure and persistent infections.    EKGs/Labs/Other Studies Reviewed:    The following studies were reviewed today:   EKG:  The ekg ordered today demonstrates sinus rhythm, heart  rate 68 bpm with nonspecific ST changes.  In comparison to EKG done on 01/23/2019 no significant change.  Recent Labs: 01/22/2019: BUN 18; Creatinine, Ser 1.04; Hemoglobin 15.8; Platelets 222; Potassium 3.8; Sodium 139  Recent Lipid Panel No results found for: CHOL, TRIG, HDL, CHOLHDL, VLDL, LDLCALC, LDLDIRECT  Physical Exam:    VS:  BP 120/80 (BP Location: Left Arm, Patient Position: Sitting, Cuff Size: Large)   Pulse 67   Ht 5\' 11"  (1.803 m)   Wt 272 lb (123.4 kg)   SpO2 96%   BMI 37.94 kg/m     Wt Readings from Last 3 Encounters:  01/25/19 272 lb (123.4 kg)  03/27/16 290 lb (131.5 kg)  03/24/16 286 lb (129.7 kg)     GEN: Well nourished, well developed in  no acute distress HEENT: Normal NECK: No JVD; No carotid bruits LYMPHATICS: No lymphadenopathy CARDIAC: S1S2 noted,RRR, no murmurs, rubs, gallops RESPIRATORY:  Clear to auscultation without rales, wheezing or rhonchi  ABDOMEN: Soft, non-tender, non-distended, +bowel sounds, no guarding. EXTREMITIES: No edema, No cyanosis, no clubbing MUSCULOSKELETAL:  No edema; No deformity  SKIN: Warm and dry NEUROLOGIC:  Alert and oriented x 3, non-focal PSYCHIATRIC:  Normal affect, good insight  ASSESSMENT:    1. Hypertension, unspecified type   2. Chest pain, unspecified type   3. Murmur    PLAN:    1.  Chest pain-patient does have factors for coronary artery disease therefore with his symptoms is appropriate to pursue an ischemic evaluation.  I will have the patient scheduled for CTA coronaries.  Does not have IV contrast allergy.  He was educated about this testing he is willing to proceed with this testing.  2.  Cardiac murmur-an echocardiogram will be done to assess the severity of this.  3.  Hypertension pressure susceptible today we will continue him on his current medication regimen  4.  Hyperlipidemia-continue patient on simvastatin.  5.  Obesity-the patient understands the need to lose weight with diet and exercise.  We have discussed specific strategies for this.  6.  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 This is in an effort to avoid a potential catastrophe.  The patient is in agreement with the above plan. The patient left the office in stable condition.  The patient will follow up in months 2 months.   Medication Adjustments/Labs and Tests Ordered: Current medicines are reviewed at length with the patient today.  Concerns regarding medicines are outlined above.  Orders Placed This Encounter  Procedures  . CT CORONARY MORPH W/CTA COR W/SCORE W/CA W/CM &/OR WO/CM  . CT CORONARY FRACTIONAL FLOW RESERVE DATA PREP  . CT CORONARY FRACTIONAL FLOW RESERVE FLUID ANALYSIS  . EKG 12-Lead  . ECHOCARDIOGRAM COMPLETE   Meds ordered this encounter  Medications  . DISCONTD: metoprolol tartrate (LOPRESSOR) 100 MG tablet    Sig: Take 1 tablet (100 mg total) by mouth 2 (two) times daily.    Dispense:  180 tablet    Refill:  3  . metoprolol tartrate (LOPRESSOR) 100 MG tablet    Sig: Take 1 tablet (100mg ) 2 hours prior to Cardiac CT if heart rate greater than 55.    Dispense:  1 tablet    Refill:  0    Patient Instructions  Medication Instructions:  Your physician recommends that you continue on your current medications as directed. Please refer to the Current Medication list given to you today.   Lab Work: None Ordered  Testing/Procedures: Your physician has requested that you have an echocardiogram. Echocardiography is a painless test that uses sound waves to create images of your heart. It provides your doctor with information about the size and shape of your heart and how well your heart's chambers and valves are working. This procedure takes approximately one hour. There are no restrictions for this procedure.  Your physician has requested that you have cardiac CT. Cardiac computed  tomography (CT) is a painless test that uses an x-ray machine to take clear, detailed pictures of your heart. For further information please visit [email or web address]. Please follow instruction sheet as given.    Follow-Up: At [facility], you and your health needs are our  priority.  As part of our continuing mission to provide you with exceptional heart care, we have created designated Provider Care Teams.  These Care Teams include your primary Cardiologist (physician) and Advanced Practice Providers (APPs -  Physician Assistants and Nurse Practitioners) who all work together to provide you with the care you need, when you need it.  Your next appointment:   2 months  The format for your next appointment:   In Person  Provider:   You may see Dr. Harriet Masson or the following Advanced Practice Provider on your designated Care Team:    Laurann Montana, FNP   Other Instructions Your cardiac CT will be scheduled at one of the below locations:   [facility] [address] [place], [place] [address] [phone]   If scheduled at [facility], please arrive at the [facility] main entrance of [facility] 30-45 minutes prior to test start time. Proceed to the [facility] Radiology Department (first floor) to check-in and test prep.  If scheduled at [facility], please arrive 15 mins early for check-in and test prep.  Please follow these instructions carefully (unless otherwise directed):    On the Night Before the Test: . Be sure to Drink plenty of water. . Do not consume any caffeinated/decaffeinated beverages or chocolate 12 hours prior to your test. . Do not take any antihistamines 12 hours prior to your test.  On the Day of the Test: . Drink plenty of water. Do not drink any water within one hour of the test. . Do not eat any food 4 hours prior to the test. . You may take your regular medications prior to the test.  . Take  metoprolol (Lopressor) two hours prior to test. . HOLD Furosemide/Hydrochlorothiazide morning of the test. . FEMALES- please wear underwire-free bra if available   *For Clinical Staff only. Please instruct patient the following:*        -Drink plenty of water       -Hold Furosemide/hydrochlorothiazide morning of the test       -Check Heart Rate 2 hours prior to test.       -If heart rate is greater than 55 beats per minute, Take metoprolol (Lopressor) 2 hours prior to test.          After the Test: . Drink plenty of water. . After receiving IV contrast, you may experience a mild flushed feeling. This is normal. . On occasion, you may experience a mild rash up to 24 hours after the test. This is not dangerous. If this occurs, you can take Benadryl 25 mg and increase your fluid intake. . If you experience trouble breathing, this can be serious. If it is severe call 911 IMMEDIATELY. If it is mild, please call our office. . If you take any of these medications: Glipizide/Metformin, Avandament, Glucavance, please do not take 48 hours after completing test unless otherwise instructed.   Once we have confirmed authorization from your insurance company, we will call you to set up a date and time for your test.   For non-scheduling related questions, please contact the cardiac imaging nurse navigator should you have any questions/concerns: Marchia Bond, RN Navigator Cardiac Imaging [facility] [phone] Office     Echocardiogram An echocardiogram is a procedure that uses painless sound waves (ultrasound) to produce an image of the heart. Images from an echocardiogram can provide important information about:  Signs of coronary artery disease (CAD).  Aneurysm detection. An aneurysm is a weak or damaged part of an artery wall that bulges out from the normal force of blood pumping through the body.  Heart size and shape. Changes in the size or shape of the heart  can be associated with certain conditions, including heart failure, aneurysm, and CAD.  Heart muscle function.  Heart valve function.  Signs of a past heart attack.  Fluid buildup around the heart.  Thickening of the heart muscle.  A tumor or infectious growth around the heart valves. Tell a health care provider about:  Any allergies you have.  All medicines you are taking, including vitamins, herbs, eye drops, creams, and over-the-counter medicines.  Any blood disorders you have.  Any surgeries you have had.  Any medical conditions you have.  Whether you are pregnant or may be pregnant. What are the risks? Generally, this is a safe procedure. However, problems may occur, including:  Allergic reaction to dye (contrast) that may be used during the procedure. What happens before the procedure? No specific preparation is needed. You may eat and drink normally. What happens during the procedure?   An IV tube may be inserted into one of your veins.  You may receive contrast through this tube. A contrast is an injection that improves the quality of the pictures from your heart.  A gel will be applied to your chest.  A wand-like tool (transducer) will be moved over your chest. The gel will help to transmit the sound waves from the transducer.  The sound waves will harmlessly bounce off of your heart to allow the heart images to be captured in real-time motion. The images will be recorded on a computer. The procedure may vary among health care providers and hospitals. What happens after the procedure?  You may return to your normal, everyday life, including diet, activities, and medicines, unless your health care provider tells you not to do that. Summary  An echocardiogram is a procedure that uses painless sound waves (ultrasound) to produce an image of the heart.  Images from an echocardiogram can provide important information about the size and shape of your heart,  heart muscle function, heart valve function, and fluid buildup around your heart.  You do not need to do anything to prepare before this procedure. You may eat and drink normally.  After the echocardiogram is completed, you may return to your normal, everyday life, unless your health care provider tells you not to do that. This information is not intended to replace advice given to you by your health care provider. Make sure you discuss any questions you have with your health care provider. Document Released: 02/28/2000 Document Revised: 06/23/2018 Document Reviewed: 04/04/2016 Elsevier Patient Education  2020 [employer].      Adopting a Healthy Lifestyle.  Know what a healthy weight is for you (roughly BMI <25) and aim to maintain this   Aim for 7+ servings of fruits and vegetables daily   65-80+ fluid ounces of water or unsweet tea for healthy kidneys   Limit to max 1 drink of alcohol per day; avoid smoking/tobacco   Limit animal fats in diet for cholesterol and heart health - choose grass fed whenever available   Avoid highly processed foods, and foods high in saturated/trans fats   Aim for low stress - take time to unwind and care for your mental health   Aim for 150 min of moderate intensity exercise weekly for heart health, and weights twice weekly for bone  health   Aim for 7-9 hours of sleep daily   When it comes to diets, agreement about the perfect plan isnt easy to find, even among the experts. Experts at the [employer] developed an idea known as the Healthy Eating Plate. Just imagine a plate divided into logical, healthy portions.   The emphasis is on diet quality:   Load up on vegetables and fruits - one-half of your plate: Aim for color and variety, and remember that potatoes dont count.   Go for whole grains - one-quarter of your plate: Whole wheat, barley, wheat berries, quinoa, oats, brown rice, and foods made with them. If you want  pasta, go with whole wheat pasta.   Protein power - one-quarter of your plate: Fish, chicken, beans, and nuts are all healthy, versatile protein sources. Limit red meat.   The diet, however, does go beyond the plate, offering a few other suggestions.   Use healthy plant oils, such as olive, canola, soy, corn, sunflower and peanut. Check the labels, and avoid partially hydrogenated oil, which have unhealthy trans fats.   If youre thirsty, drink water. Coffee and tea are good in moderation, but skip sugary drinks and limit milk and dairy products to one or two daily servings.   The type of carbohydrate in the diet is more important than the amount. Some sources of carbohydrates, such as vegetables, fruits, whole grains, and beans-are healthier than others.   Finally, stay active  Signed, Berniece Salines, DO  01/25/2019 2:20 PM    [place]

## 2019-01-25 NOTE — Patient Instructions (Addendum)
Medication Instructions:  Your physician recommends that you continue on your current medications as directed. Please refer to the Current Medication list given to you today.   Lab Work: None Ordered  Testing/Procedures: Your physician has requested that you have an echocardiogram. Echocardiography is a painless test that uses sound waves to create images of your heart. It provides your doctor with information about the size and shape of your heart and how well your heart's chambers and valves are working. This procedure takes approximately one hour. There are no restrictions for this procedure.  Your physician has requested that you have cardiac CT. Cardiac computed tomography (CT) is a painless test that uses an x-ray machine to take clear, detailed pictures of your heart. For further information please visit [email or web address]. Please follow instruction sheet as given.    Follow-Up: At [facility], you and your health needs are our priority.  As part of our continuing mission to provide you with exceptional heart care, we have created designated Provider Care Teams.  These Care Teams include your primary Cardiologist (physician) and Advanced Practice Providers (APPs -  Physician Assistants and Nurse Practitioners) who all work together to provide you with the care you need, when you need it.  Your next appointment:   2 months  The format for your next appointment:   In Person  Provider:   You may see Dr. Harriet Masson or the following Advanced Practice Provider on your designated Care Team:    Laurann Montana, FNP   Other Instructions Your cardiac CT will be scheduled at one of the below locations:   [facility] [address] [place], [place] [address] [phone]   If scheduled at [facility], please arrive at the [facility] main entrance of [facility] 30-45 minutes prior to test start time. Proceed to the [facility] Radiology Department (first  floor) to check-in and test prep.  If scheduled at [facility], please arrive 15 mins early for check-in and test prep.  Please follow these instructions carefully (unless otherwise directed):    On the Night Before the Test: . Be sure to Drink plenty of water. . Do not consume any caffeinated/decaffeinated beverages or chocolate 12 hours prior to your test. . Do not take any antihistamines 12 hours prior to your test.  On the Day of the Test: . Drink plenty of water. Do not drink any water within one hour of the test. . Do not eat any food 4 hours prior to the test. . You may take your regular medications prior to the test.  . Take metoprolol (Lopressor) two hours prior to test. . HOLD Furosemide/Hydrochlorothiazide morning of the test. . FEMALES- please wear underwire-free bra if available   *For Clinical Staff only. Please instruct patient the following:*        -Drink plenty of water       -Hold Furosemide/hydrochlorothiazide morning of the test       -Check Heart Rate 2 hours prior to test.       -If heart rate is greater than 55 beats per minute, Take metoprolol (Lopressor) 2 hours prior to test.          After the Test: . Drink plenty of water. . After receiving IV contrast, you may experience a mild flushed feeling. This is normal. . On occasion, you may experience a mild rash up to 24 hours after the test. This is not dangerous. If this occurs, you can take  Benadryl 25 mg and increase your fluid intake. . If you experience trouble breathing, this can be serious. If it is severe call 911 IMMEDIATELY. If it is mild, please call our office. . If you take any of these medications: Glipizide/Metformin, Avandament, Glucavance, please do not take 48 hours after completing test unless otherwise instructed.   Once we have confirmed authorization from your insurance company, we will call you to set up a date and time for your test.   For  non-scheduling related questions, please contact the cardiac imaging nurse navigator should you have any questions/concerns: Marchia Bond, RN Navigator Cardiac Imaging [facility] [phone] Office     Echocardiogram An echocardiogram is a procedure that uses painless sound waves (ultrasound) to produce an image of the heart. Images from an echocardiogram can provide important information about:  Signs of coronary artery disease (CAD).  Aneurysm detection. An aneurysm is a weak or damaged part of an artery wall that bulges out from the normal force of blood pumping through the body.  Heart size and shape. Changes in the size or shape of the heart can be associated with certain conditions, including heart failure, aneurysm, and CAD.  Heart muscle function.  Heart valve function.  Signs of a past heart attack.  Fluid buildup around the heart.  Thickening of the heart muscle.  A tumor or infectious growth around the heart valves. Tell a health care provider about:  Any allergies you have.  All medicines you are taking, including vitamins, herbs, eye drops, creams, and over-the-counter medicines.  Any blood disorders you have.  Any surgeries you have had.  Any medical conditions you have.  Whether you are pregnant or may be pregnant. What are the risks? Generally, this is a safe procedure. However, problems may occur, including:  Allergic reaction to dye (contrast) that may be used during the procedure. What happens before the procedure? No specific preparation is needed. You may eat and drink normally. What happens during the procedure?   An IV tube may be inserted into one of your veins.  You may receive contrast through this tube. A contrast is an injection that improves the quality of the pictures from your heart.  A gel will be applied to your chest.  A wand-like tool (transducer) will be moved over your chest. The gel will help to  transmit the sound waves from the transducer.  The sound waves will harmlessly bounce off of your heart to allow the heart images to be captured in real-time motion. The images will be recorded on a computer. The procedure may vary among health care providers and hospitals. What happens after the procedure?  You may return to your normal, everyday life, including diet, activities, and medicines, unless your health care provider tells you not to do that. Summary  An echocardiogram is a procedure that uses painless sound waves (ultrasound) to produce an image of the heart.  Images from an echocardiogram can provide important information about the size and shape of your heart, heart muscle function, heart valve function, and fluid buildup around your heart.  You do not need to do anything to prepare before this procedure. You may eat and drink normally.  After the echocardiogram is completed, you may return to your normal, everyday life, unless your health care provider tells you not to do that. This information is not intended to replace advice given to you by your health care provider. Make sure you discuss any questions  you have with your health care provider. Document Released: 02/28/2000 Document Revised: 06/23/2018 Document Reviewed: 04/04/2016 Elsevier Patient Education  2020 [employer].

## 2019-01-26 ENCOUNTER — Telehealth: Payer: Self-pay

## 2019-01-26 DIAGNOSIS — R079 Chest pain, unspecified: Secondary | ICD-10-CM

## 2019-01-26 DIAGNOSIS — I10 Essential (primary) hypertension: Secondary | ICD-10-CM

## 2019-01-26 NOTE — Telephone Encounter (Addendum)
Phoned pt, left detailed msg (DPR) informing that once he's contacted with a date for CTA, he will need to come to our office between 8am -12pm or  1pm -5pm 5-7 days prior to procedure to have lab work drawn.  Requested return call for confirmation if he has any questions. BMET order entered.

## 2019-01-27 ENCOUNTER — Other Ambulatory Visit: Payer: Self-pay

## 2019-01-27 ENCOUNTER — Ambulatory Visit (HOSPITAL_BASED_OUTPATIENT_CLINIC_OR_DEPARTMENT_OTHER)
Admission: RE | Admit: 2019-01-27 | Discharge: 2019-01-27 | Disposition: A | Discharge status: Home / Self Care | Payer: BC Managed Care – PPO | Source: Ambulatory Visit | Attending: Internal Medicine | Admitting: Internal Medicine

## 2019-01-27 ENCOUNTER — Encounter: Payer: Self-pay | Admitting: *Deleted

## 2019-01-27 DIAGNOSIS — I10 Essential (primary) hypertension: Secondary | ICD-10-CM | POA: Diagnosis not present

## 2019-01-27 DIAGNOSIS — R079 Chest pain, unspecified: Secondary | ICD-10-CM | POA: Insufficient documentation

## 2019-01-27 DIAGNOSIS — R011 Cardiac murmur, unspecified: Secondary | ICD-10-CM | POA: Diagnosis not present

## 2019-01-27 NOTE — Progress Notes (Signed)
  Echocardiogram 2D Echocardiogram has been performed.  Thomas [name] 01/27/2019, 1:46 PM

## 2019-01-27 NOTE — Telephone Encounter (Signed)
That will be fine Gregary Signs. We can provide a note for desk work.

## 2019-01-27 NOTE — Telephone Encounter (Signed)
Patient returned call and informed that once he gets date for CTA to come in to our office 5-7 days before to have labs drawn with lab hours specified.  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 He will be in building today for Echocardiogram at 1:15 and would like to pick up this note at that time if possible.

## 2019-01-30 ENCOUNTER — Telehealth: Payer: Self-pay | Admitting: *Deleted

## 2019-01-30 NOTE — Telephone Encounter (Signed)
Patient returned your call and would like a call back.

## 2019-01-30 NOTE — Telephone Encounter (Signed)
-----   Message from Berniece Salines, DO sent at 01/28/2019  9:01 AM EST ----- The echo showed mild-moderate aortic valve stenosis. This means that your aortic valve does not open well. In your case there is calcium on the leaflets of the aortic valve.  The walls of the left side of the heart is also thickened; the hypertension and the aortic stenosis can cause this.  Please schedule your CTA coronary as soon as possible

## 2019-01-30 NOTE — Telephone Encounter (Signed)
Telephone call to patient . Left message with echo results and to call with any questions.

## 2019-01-30 NOTE — Telephone Encounter (Signed)
Patient states has shingles and it is localized to the left chest and wondered if this could be the source of his cheat pain. Explained that there is no way to know if his chest pain is cardiac or shingle related unless his has the ischemic evaluation ( Cardiac CTa). He states that he is not sure whether he will have the CTa because of cost but they still haven't precerted him yet. He will wait until they have told him how much it will cost him and then make a decision.

## 2019-02-16 ENCOUNTER — Telehealth: Payer: Self-pay | Admitting: Cardiology

## 2019-02-16 NOTE — Telephone Encounter (Signed)
Patient calling to set up CT Morph appt.

## 2019-02-22 ENCOUNTER — Telehealth (HOSPITAL_COMMUNITY): Payer: Self-pay | Admitting: Emergency Medicine

## 2019-02-22 LAB — BASIC METABOLIC PANEL
BUN/Creatinine Ratio: 17 (ref 10–24)
BUN: 20 mg/dL (ref 8–27)
CO2: 26 mmol/L (ref 20–29)
Calcium: 10.1 mg/dL (ref 8.6–10.2)
Chloride: 102 mmol/L (ref 96–106)
Creatinine, Ser: 1.2 mg/dL (ref 0.76–1.27)
GFR calc Af Amer: 74 mL/min/{1.73_m2} (ref >59)
GFR calc non Af Amer: 64 mL/min/{1.73_m2} (ref >59)
Glucose: 111 mg/dL — ABNORMAL HIGH (ref 65–99)
Potassium: 4.4 mmol/L (ref 3.5–5.2)
Sodium: 144 mmol/L (ref 134–144)

## 2019-02-22 NOTE — Telephone Encounter (Signed)
Left message on voicemail with name and callback number Brenn Gatton RN Navigator Cardiac Imaging [facility] [phone] Office [phone] Cell  

## 2019-02-23 ENCOUNTER — Other Ambulatory Visit: Payer: Self-pay

## 2019-02-23 ENCOUNTER — Ambulatory Visit (HOSPITAL_COMMUNITY)
Admission: RE | Admit: 2019-02-23 | Discharge: 2019-02-23 | Disposition: A | Discharge status: Home / Self Care | Payer: BC Managed Care – PPO | Source: Ambulatory Visit | Attending: Cardiology | Admitting: Cardiology

## 2019-02-23 DIAGNOSIS — R079 Chest pain, unspecified: Secondary | ICD-10-CM | POA: Insufficient documentation

## 2019-02-23 DIAGNOSIS — R011 Cardiac murmur, unspecified: Secondary | ICD-10-CM | POA: Diagnosis not present

## 2019-02-23 MED ORDER — NITROGLYCERIN 0.4 MG SL SUBL
SUBLINGUAL_TABLET | SUBLINGUAL | Status: AC
  Filled 2019-02-23: qty 2

## 2019-02-23 MED ORDER — IOHEXOL 350 MG/ML SOLN
100.0000 mL | Freq: Once | INTRAVENOUS | Status: AC | PRN
  Administered 2019-02-23: 100 mL via INTRAVENOUS

## 2019-02-23 MED ORDER — NITROGLYCERIN 0.4 MG SL SUBL
0.8000 mg | SUBLINGUAL_TABLET | Freq: Once | SUBLINGUAL | Status: AC
  Administered 2019-02-23: 10:00:00 0.8 mg via SUBLINGUAL

## 2019-02-24 ENCOUNTER — Ambulatory Visit (INDEPENDENT_AMBULATORY_CARE_PROVIDER_SITE_OTHER): Payer: BC Managed Care – PPO | Admitting: Cardiology

## 2019-02-24 ENCOUNTER — Encounter: Payer: Self-pay | Admitting: Cardiology

## 2019-02-24 VITALS — BP 126/84 | HR 52 | Ht 71.0 in | Wt 279.0 lb

## 2019-02-24 DIAGNOSIS — I10 Essential (primary) hypertension: Secondary | ICD-10-CM

## 2019-02-24 DIAGNOSIS — I350 Nonrheumatic aortic (valve) stenosis: Secondary | ICD-10-CM

## 2019-02-24 DIAGNOSIS — I2510 Atherosclerotic heart disease of native coronary artery without angina pectoris: Secondary | ICD-10-CM

## 2019-02-24 MED ORDER — ROSUVASTATIN CALCIUM 20 MG PO TABS: 20.0000 mg | ORAL_TABLET | Freq: Every day | ORAL | 3 refills | Status: DC

## 2019-02-24 MED ORDER — ASPIRIN 81 MG PO CHEW: 81.0000 mg | CHEWABLE_TABLET | Freq: Every day | ORAL | 1 refills | Status: DC

## 2019-02-24 MED ORDER — METOPROLOL SUCCINATE ER 25 MG PO TB24: ORAL_TABLET | ORAL | 0 refills | Status: DC

## 2019-02-24 NOTE — Progress Notes (Signed)
Cardiology Office Note:    Date:  02/24/2019   ID:  Thomas [name], DOB 08-22-1955, MRN [id number]  PCP:  Josetta Huddle, MD  Cardiologist:  Berniece Salines, DO  Electrophysiologist:  None   Referring MD: Josetta Huddle, MD   Chief Complaint  Patient presents with  . Follow-up    History of Present Illness:    Thomas [name] is a 63 y.o. male with a hx of hypertension, hyperlipidemia, Anxiety and obesity. I initially saw the patient on 01/25/2019 at that time he reported chest pain. A echocardiogram and a CCTA was ordered. The patient was able to get his testing done.   He is here today for a follow up visit. He is not experiencing chest pain today.  Of note the patient had been taking metoprolol tartrate 100 mg twice a day. The patient states that he received lopressor 100 mg BID from and pharmacy and had been taking his medication since.   Past Medical History:  Diagnosis Date  . Anemia    hx of in 1992   . Anxiety   . Arthritis    lower back   . GERD (gastroesophageal reflux disease)    rarely   . History of anxiety   . Hyperlipemia   . Hypertension   . Lower urinary tract symptoms (LUTS)   . Malignant neoplasm of prostate ([place]) 04/17/2014  . Pneumonia    hx x2 of pneumonia at age 51   . 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 Ventral hernia without obstruction or gangrene 03/27/2016    Past Surgical History:  Procedure Laterality Date  . Epididymis Excision Left    Spermocele  . HEMORROIDECTOMY    . HERNIA REPAIR Bilateral 1967 and 1986   x2 open inguinal hernia repairs  . INCISION AND DRAINAGE OF WOUND  2017   Wound Infection Post OP  . LYMPHADENECTOMY Bilateral 05/17/2014   Procedure: PELVIC LYMPHADENECTOMY;  Surgeon: Raynelle Bring, MD;  Location: [facility];  Service: Urology;  Laterality: Bilateral;  . PROSTATE BIOPSY  03/22/14   Raynelle Bring  . ROBOT ASSISTED LAPAROSCOPIC RADICAL PROSTATECTOMY N/A 05/17/2014   Procedure: ROBOTIC ASSISTED LAPAROSCOPIC  RADICAL PROSTATECTOMY LEVEL 2;  Surgeon: Raynelle Bring, MD;  Location: [facility];  Service: Urology;  Laterality: N/A;  . SKIN TAG REMOVAL     15 lesions  . VASECTOMY    . VENTRAL HERNIA REPAIR N/A 03/27/2016   Procedure: LAPAROSCOPIC VENTRAL HERNIA;  Surgeon: Alphonsa Overall, MD;  Location: [facility];  Service: General;  Laterality: N/A;  WITH MESH    Current Medications: Current Meds  Medication Sig  . Cholecalciferol (VITAMIN D) 2000 units tablet Take 2,000 Units by mouth daily.  Marland Kitchen escitalopram (LEXAPRO) 20 MG tablet Take 20 mg by mouth every morning.   . Multiple Vitamins-Minerals (MULTIVITAMIN WITH MINERALS) tablet Take 1 tablet by mouth daily.  . valsartan-hydrochlorothiazide (DIOVAN-HCT) 320-25 MG per tablet Take 1 tablet by mouth every morning.   . vitamin B-12 (CYANOCOBALAMIN) 500 MCG tablet Take 500 mcg by mouth daily.  . [DISCONTINUED] metoprolol tartrate (LOPRESSOR) 100 MG tablet Take 1 tablet (100mg ) 2 hours prior to Cardiac CT if heart rate greater than 55.  . [DISCONTINUED] simvastatin (ZOCOR) 20 MG tablet Take 20 mg by mouth every morning.      Allergies:   Patient has no known allergies.   Social History   Socioeconomic History  . Marital status: Married    Spouse name: Not on file  . Number  of children: 2  . Years of education: Not on file  . Highest education level: Not on file  Occupational History  . Occupation: Sales  Tobacco Use  . Smoking status: Never Smoker  . Smokeless tobacco: Never Used  Substance and Sexual Activity  . Alcohol use: No    Alcohol/week: 0.0 standard drinks  . Drug use: No  . Sexual activity: Not on file  Other Topics Concern  . Not on file  Social History Narrative  . Not on file   Social Determinants of Health   Financial Resource Strain:   . Difficulty of Paying Living Expenses: Not on file  Food Insecurity:   . Worried About Charity fundraiser in the Last Year: Not on file  . Ran Out of Food in the Last Year: Not on file    Transportation Needs:   . Lack of Transportation (Medical): Not on file  . Lack of Transportation (Non-Medical): Not on file  Physical Activity:   . Days of Exercise per Week: Not on file  . Minutes of Exercise per Session: Not on file  Stress:   . Feeling of Stress : Not on file  Social Connections:   . Frequency of Communication with Friends and Family: Not on file  . Frequency of Social Gatherings with Friends and Family: Not on file  . Attends Religious Services: Not on file  . Active Member of Clubs or Organizations: Not on file  . Attends Archivist Meetings: Not on file  . Marital Status: Not on file     Family History: The patient's family history includes COPD in his mother; Cancer in his mother; Heart attack in his mother and paternal grandfather; Heart failure in his mother; Ovarian cancer in his mother.  ROS:   Review of Systems  Constitution: Negative for decreased appetite, fever and weight gain.  HENT: Negative for congestion, ear discharge, hoarse voice and sore throat.   Eyes: Negative for discharge, redness, vision loss in right eye and visual halos.  Cardiovascular: Negative for chest pain, dyspnea on exertion, leg swelling, orthopnea and palpitations.  Respiratory: Negative for cough, hemoptysis, shortness of breath and snoring.   Endocrine: Negative for heat intolerance and polyphagia.  Hematologic/Lymphatic: Negative for bleeding problem. Does not bruise/bleed easily.  Skin: Negative for flushing, nail changes, rash and suspicious lesions.  Musculoskeletal: Negative for arthritis, joint pain, muscle cramps, myalgias, neck pain and stiffness.  Gastrointestinal: Negative for abdominal pain, bowel incontinence, diarrhea and excessive appetite.  Genitourinary: Negative for decreased libido, genital sores and incomplete emptying.  Neurological: Negative for brief paralysis, focal weakness, headaches and loss of balance.  Psychiatric/Behavioral: Negative  for altered mental status, depression and suicidal ideas.  Allergic/Immunologic: Negative for HIV exposure and persistent infections.    EKGs/Labs/Other Studies Reviewed:    The following studies were reviewed today:   EKG: None today  CCTA IMPRESSION: 1. Coronary calcium score of 113. This was 58 percentile for age and sex matched control.  2. No Left main artery present. LAD and LCX with two separate origin. Left Dominance.  3. Non-obstructive Coronary artery disease with the most significant lesion being moderate (50-69%). CAD-RADS 3. This study will be sent for FFR.  TTE IMPRESSIONS:   1. Left ventricular ejection fraction, by visual estimation, is 55 to 60%. The left ventricle has normal function. Left ventricular septal wall thickness was moderately increased. Moderately increased left ventricular posterior wall thickness. There is  moderately increased left ventricular hypertrophy.  2.  Left ventricular diastolic parameters are consistent with Grade I diastolic dysfunction (impaired relaxation).  3. Global right ventricle has normal systolic function.The right ventricular size is normal. No increase in right ventricular wall thickness.  4. Left atrial size was normal.  5. Right atrial size was normal.  6. The mitral valve is normal in structure. No evidence of mitral valve regurgitation. No evidence of mitral stenosis.  7. The tricuspid valve is normal in structure. Tricuspid valve regurgitation is trivial.  8. The aortic valve is mildly thickened with mild calcification. There is mild-moderate aortic valve stenosis. AVA by VTI 1.10 cmsq, Mean gradient 18 mmHg, Peak velocity 2.8 m/s. Aortic valve regurgitation is not visualized.  9. The pulmonic valve was not well visualized. Pulmonic valve regurgitation is not visualized. 10. Normal pulmonary artery systolic pressure.   Recent Labs: 01/22/2019: Hemoglobin 15.8; Platelets 222 02/21/2019: BUN 20; Creatinine, Ser 1.20;  Potassium 4.4; Sodium 144  Recent Lipid Panel No results found for: CHOL, TRIG, HDL, CHOLHDL, VLDL, LDLCALC, LDLDIRECT  Physical Exam:    VS:  BP 126/84 (BP Location: Right Arm, Patient Position: Sitting, Cuff Size: Normal)   Pulse (!) 52   Ht 5\' 11"  (1.803 m)   Wt 279 lb (126.6 kg)   SpO2 93%   BMI 38.91 kg/m     Wt Readings from Last 3 Encounters:  02/24/19 279 lb (126.6 kg)  01/25/19 272 lb (123.4 kg)  03/27/16 290 lb (131.5 kg)     GEN: Well nourished, well developed in no acute distress HEENT: Normal NECK: No JVD; No carotid bruits LYMPHATICS: No lymphadenopathy CARDIAC: S1S2 noted,RRR, no murmurs, rubs, gallops RESPIRATORY:  Clear to auscultation without rales, wheezing or rhonchi  ABDOMEN: Soft, non-tender, non-distended, +bowel sounds, no guarding. EXTREMITIES: No edema, No cyanosis, no clubbing MUSCULOSKELETAL:  No edema; No deformity  SKIN: Warm and dry NEUROLOGIC:  Alert and oriented x 3, non-focal PSYCHIATRIC:  Normal affect, good insight  ASSESSMENT:    1. Nonrheumatic aortic valve stenosis   2. Coronary artery disease involving native coronary artery of native heart without angina pectoris    PLAN:    Mr. Growney is here for a follow-up visit for his recent testings.  I first started to discuss his primary CT which was performed yesterday and showed evidence of moderate obstructive coronary artery disease.  I explained the patient at this time we will invest heavily in medical management.  Therefore I have started him on aspirin 81 mg daily, change his simvastatin 20 mg to Crestor 20 mg for higher potent statin.  Unfortunately, the patient has been taking 100 mg twice a day metoprolol tartrate which was not intended daily use.  This medication was inadvertently sent to the patient as he only should have taken 100 mg 2 hours prior to his CT scan.  I have immediately asked the patient to stop taking metoprolol tartrate 100 mg twice daily.  For his coronary  disease he needs to be on a beta-blocker which also will help his hypertension.  I have asked him to metoprolol succinate 12.5 mg daily on Monday February 27 2019.  In addition his echocardiogram showed evidence of mild to moderate aortic valve stenosis.  At the educated patient what this means, and also that we will be doing surveillance of his aortic valve as needed with repeated echocardiogram.  He is also bradycardic in the office today with his heart rate 52 bpm which I think is due to the effect of the Lopressor 100 mg  stated above.  The patient is in agreement with the above plan. The patient left the office in stable condition.  The patient will follow up in 3 months or sooner if needed.   Medication Adjustments/Labs and Tests Ordered: Current medicines are reviewed at length with the patient today.  Concerns regarding medicines are outlined above.  No orders of the defined types were placed in this encounter.  Meds ordered this encounter  Medications  . aspirin (HM ASPIRIN) 81 MG chewable tablet    Sig: Chew 1 tablet (81 mg total) by mouth daily.    Dispense:  90 tablet    Refill:  1  . rosuvastatin (CRESTOR) 20 MG tablet    Sig: Take 1 tablet (20 mg total) by mouth daily.    Dispense:  90 tablet    Refill:  3  . metoprolol succinate (TOPROL-XL) 25 MG 24 hr tablet    Sig: Take 1/2 tablet (12.5 mg total) daily with or immediately following a meal.    Dispense:  90 tablet    Refill:  0    Patient Instructions  Medication Instructions:  Your physician has recommended you make the following change in your medication:   STOP SIMVASTATIN STOP LOPRESSOR   START ASPIRIN 81MG  DAILY START CRESTOR 20 MG DAILY   START TOPROL XL 12.5 MG DAILY    *If you need a refill on your cardiac medications before your next appointment, please call your pharmacy*  Lab Work: None Ordered .  Testing/Procedures: None Ordered  Follow-Up: At [facility], you and your health needs are  our priority.  As part of our continuing mission to provide you with exceptional heart care, we have created designated Provider Care Teams.  These Care Teams include your primary Cardiologist (physician) and Advanced Practice Providers (APPs -  Physician Assistants and Nurse Practitioners) who all work together to provide you with the care you need, when you need it.  Your next appointment:   3 month(s)  The format for your next appointment:   In Person  Provider:   Berniece Salines, DO      Adopting a Healthy Lifestyle.  Know what a healthy weight is for you (roughly BMI <25) and aim to maintain this   Aim for 7+ servings of fruits and vegetables daily   65-80+ fluid ounces of water or unsweet tea for healthy kidneys   Limit to max 1 drink of alcohol per day; avoid smoking/tobacco   Limit animal fats in diet for cholesterol and heart health - choose grass fed whenever available   Avoid highly processed foods, and foods high in saturated/trans fats   Aim for low stress - take time to unwind and care for your mental health   Aim for 150 min of moderate intensity exercise weekly for heart health, and weights twice weekly for bone health   Aim for 7-9 hours of sleep daily   When it comes to diets, agreement about the perfect plan isnt easy to find, even among the experts. Experts at the [employer] developed an idea known as the Healthy Eating Plate. Just imagine a plate divided into logical, healthy portions.   The emphasis is on diet quality:   Load up on vegetables and fruits - one-half of your plate: Aim for color and variety, and remember that potatoes dont count.   Go for whole grains - one-quarter of your plate: Whole wheat, barley, wheat berries, quinoa, oats, brown rice, and foods made with  them. If you want pasta, go with whole wheat pasta.   Protein power - one-quarter of your plate: Fish, chicken, beans, and nuts are all healthy, versatile protein  sources. Limit red meat.   The diet, however, does go beyond the plate, offering a few other suggestions.   Use healthy plant oils, such as olive, canola, soy, corn, sunflower and peanut. Check the labels, and avoid partially hydrogenated oil, which have unhealthy trans fats.   If youre thirsty, drink water. Coffee and tea are good in moderation, but skip sugary drinks and limit milk and dairy products to one or two daily servings.   The type of carbohydrate in the diet is more important than the amount. Some sources of carbohydrates, such as vegetables, fruits, whole grains, and beans-are healthier than others.   Finally, stay active  Signed, Berniece Salines, DO  02/24/2019 1:06 PM    [facility]

## 2019-02-24 NOTE — Patient Instructions (Signed)
Medication Instructions:  Your physician has recommended you make the following change in your medication:   STOP SIMVASTATIN STOP LOPRESSOR   START ASPIRIN 81MG  DAILY START CRESTOR 20 MG DAILY   START TOPROL XL 12.5 MG DAILY    *If you need a refill on your cardiac medications before your next appointment, please call your pharmacy*  Lab Work: None Ordered .  Testing/Procedures: None Ordered  Follow-Up: At [facility], you and your health needs are our priority.  As part of our continuing mission to provide you with exceptional heart care, we have created designated Provider Care Teams.  These Care Teams include your primary Cardiologist (physician) and Advanced Practice Providers (APPs -  Physician Assistants and Nurse Practitioners) who all work together to provide you with the care you need, when you need it.  Your next appointment:   3 month(s)  The format for your next appointment:   In Person  Provider:   Berniece Salines, DO

## 2019-02-25 DIAGNOSIS — R011 Cardiac murmur, unspecified: Secondary | ICD-10-CM | POA: Diagnosis not present

## 2019-02-25 DIAGNOSIS — R079 Chest pain, unspecified: Secondary | ICD-10-CM | POA: Diagnosis not present

## 2019-03-09 ENCOUNTER — Other Ambulatory Visit (HOSPITAL_COMMUNITY): Payer: BC Managed Care – PPO

## 2019-03-17 HISTORY — PX: COLONOSCOPY: SHX174

## 2019-06-07 ENCOUNTER — Telehealth: Payer: Self-pay | Admitting: Cardiology

## 2019-06-07 NOTE — Telephone Encounter (Signed)
Patient returned call for [facility]. Transferred call to her.

## 2019-06-08 ENCOUNTER — Encounter: Payer: Self-pay | Admitting: Cardiology

## 2019-06-08 ENCOUNTER — Other Ambulatory Visit: Payer: Self-pay

## 2019-06-08 ENCOUNTER — Ambulatory Visit: Payer: BC Managed Care – PPO | Admitting: Cardiology

## 2019-06-08 VITALS — BP 132/86 | HR 65 | Ht 71.0 in | Wt 277.0 lb

## 2019-06-08 DIAGNOSIS — I350 Nonrheumatic aortic (valve) stenosis: Secondary | ICD-10-CM

## 2019-06-08 DIAGNOSIS — E669 Obesity, unspecified: Secondary | ICD-10-CM

## 2019-06-08 DIAGNOSIS — I10 Essential (primary) hypertension: Secondary | ICD-10-CM | POA: Diagnosis not present

## 2019-06-08 DIAGNOSIS — I2510 Atherosclerotic heart disease of native coronary artery without angina pectoris: Secondary | ICD-10-CM | POA: Diagnosis not present

## 2019-06-08 DIAGNOSIS — E782 Mixed hyperlipidemia: Secondary | ICD-10-CM | POA: Diagnosis not present

## 2019-06-08 NOTE — Progress Notes (Addendum)
Cardiology Office Note:    Date:  06/08/2019   ID:  [name], DOB 10/30/1955, MRN [id number]  PCP:  Josetta Huddle, MD  Cardiologist:  Berniece Salines, DO  Electrophysiologist:  None   Referring MD: Josetta Huddle, MD   Chief Complaint  Patient presents with  . Follow-up    History of Present Illness:    Thomas [name] is a 64 y.o. male with a hx of coronary artery disease-hypertension, hyperlipidemia, obesity, mild to moderate aortic stenosis, presents today for follow-up.  During his last visit he was status post coronary CTA at that time we discussed the results from his CTA and his echo.    Today he is here for follow-up visit.  He is doing very well from a cardiovascular standpoint.  He tells me that he has been walking every day.  He is cut back on salt and last one use it on his cucumber and he is very happy with his care.  No other complaints at this time.  Patient asked me that his medication for his depression anxiety was changed and he is doing a lot better and feels as if he is a new man.  Past Medical History:  Diagnosis Date  . Anemia    hx of in 1992   . Anxiety   . Arthritis    lower back   . GERD (gastroesophageal reflux disease)    rarely   . History of anxiety   . Hyperlipemia   . Hypertension   . Lower urinary tract symptoms (LUTS)   . Malignant neoplasm of prostate ([place]) 04/17/2014  . Pneumonia    hx x2 of pneumonia at age 65   . 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 Ventral hernia without obstruction or gangrene 03/27/2016    Past Surgical History:  Procedure Laterality Date  . Epididymis Excision Left    Spermocele  . HEMORROIDECTOMY    . HERNIA REPAIR Bilateral 1967 and 1986   x2 open inguinal hernia repairs  . INCISION AND DRAINAGE OF WOUND  2017   Wound Infection Post OP  . LYMPHADENECTOMY Bilateral 05/17/2014   Procedure: PELVIC LYMPHADENECTOMY;  Surgeon: Raynelle Bring, MD;  Location: [facility];  Service: Urology;  Laterality:  Bilateral;  . PROSTATE BIOPSY  03/22/14   Raynelle Bring  . ROBOT ASSISTED LAPAROSCOPIC RADICAL PROSTATECTOMY N/A 05/17/2014   Procedure: ROBOTIC ASSISTED LAPAROSCOPIC RADICAL PROSTATECTOMY LEVEL 2;  Surgeon: Raynelle Bring, MD;  Location: [facility];  Service: Urology;  Laterality: N/A;  . SKIN TAG REMOVAL     15 lesions  . VASECTOMY    . VENTRAL HERNIA REPAIR N/A 03/27/2016   Procedure: LAPAROSCOPIC VENTRAL HERNIA;  Surgeon: Alphonsa Overall, MD;  Location: [facility];  Service: General;  Laterality: N/A;  WITH MESH    Current Medications: Current Meds  Medication Sig  . aspirin (HM ASPIRIN) 81 MG chewable tablet Chew 1 tablet (81 mg total) by mouth daily.  . Cholecalciferol (VITAMIN D) 2000 units tablet Take 2,000 Units by mouth daily.  . metoprolol succinate (TOPROL-XL) 25 MG 24 hr tablet Take 1/2 tablet (12.5 mg total) daily with or immediately following a meal.  . Multiple Vitamins-Minerals (MULTIVITAMIN WITH MINERALS) tablet Take 1 tablet by mouth daily.  . rosuvastatin (CRESTOR) 20 MG tablet Take 1 tablet (20 mg total) by mouth daily.  . TRINTELLIX 20 MG TABS tablet Take 20 mg by mouth daily.  . valsartan-hydrochlorothiazide (DIOVAN-HCT) 320-25 MG per tablet Take 1 tablet by mouth  every morning.   . vitamin B-12 (CYANOCOBALAMIN) 500 MCG tablet Take 500 mcg by mouth daily.     Allergies:   Patient has no known allergies.   Social History   Socioeconomic History  . Marital status: Married    Spouse name: Not on file  . Number of children: 2  . Years of education: Not on file  . Highest education level: Not on file  Occupational History  . Occupation: Sales  Tobacco Use  . Smoking status: Never Smoker  . Smokeless tobacco: Never Used  Substance and Sexual Activity  . Alcohol use: No    Alcohol/week: 0.0 standard drinks  . Drug use: No  . Sexual activity: Not on file  Other Topics Concern  . Not on file  Social History Narrative  . Not on file   Social Determinants of Health    Financial Resource Strain:   . Difficulty of Paying Living Expenses:   Food Insecurity:   . Worried About Charity fundraiser in the Last Year:   . Arboriculturist in the Last Year:   Transportation Needs:   . Film/video editor (Medical):   Marland Kitchen Lack of Transportation (Non-Medical):   Physical Activity:   . Days of Exercise per Week:   . Minutes of Exercise per Session:   Stress:   . Feeling of Stress :   Social Connections:   . Frequency of Communication with Friends and Family:   . Frequency of Social Gatherings with Friends and Family:   . Attends Religious Services:   . Active Member of Clubs or Organizations:   . Attends Archivist Meetings:   Marland Kitchen Marital Status:      Family History: The patient's family history includes COPD in his mother; Cancer in his mother; Heart attack in his mother and paternal grandfather; Heart failure in his mother; Ovarian cancer in his mother.  ROS:   Review of Systems  Constitution: Negative for decreased appetite, fever and weight gain.  HENT: Negative for congestion, ear discharge, hoarse voice and sore throat.   Eyes: Negative for discharge, redness, vision loss in right eye and visual halos.  Cardiovascular: Negative for chest pain, dyspnea on exertion, leg swelling, orthopnea and palpitations.  Respiratory: Negative for cough, hemoptysis, shortness of breath and snoring.   Endocrine: Negative for heat intolerance and polyphagia.  Hematologic/Lymphatic: Negative for bleeding problem. Does not bruise/bleed easily.  Skin: Negative for flushing, nail changes, rash and suspicious lesions.  Musculoskeletal: Negative for arthritis, joint pain, muscle cramps, myalgias, neck pain and stiffness.  Gastrointestinal: Negative for abdominal pain, bowel incontinence, diarrhea and excessive appetite.  Genitourinary: Negative for decreased libido, genital sores and incomplete emptying.  Neurological: Negative for brief paralysis, focal weakness,  headaches and loss of balance.  Psychiatric/Behavioral: Negative for altered mental status, depression and suicidal ideas.  Allergic/Immunologic: Negative for HIV exposure and persistent infections.    EKGs/Labs/Other Studies Reviewed:    The following studies were reviewed today:   EKG: None today  CCTA IMPRESSION: 1. Coronary calcium score of 113. This was 46 percentile for age and sex matched control.  2. No Left main artery present. LAD and LCX with two separate origin. Left Dominance.  3. Non-obstructive Coronary artery disease with the most significant lesion being moderate (50-69%). CAD-RADS 3. This study will be sent for FFR.  TTE IMPRESSIONS:  1. Left ventricular ejection fraction, by visual estimation, is 55 to 60%. The left ventricle has normal function. Left ventricular  septal wall thickness was moderately increased. Moderately increased left ventricular posterior wall thickness. There is  moderately increased left ventricular hypertrophy. 2. Left ventricular diastolic parameters are consistent with Grade I diastolic dysfunction (impaired relaxation). 3. Global right ventricle has normal systolic function.The right ventricular size is normal. No increase in right ventricular wall thickness. 4. Left atrial size was normal. 5. Right atrial size was normal. 6. The mitral valve is normal in structure. No evidence of mitral valve regurgitation. No evidence of mitral stenosis. 7. The tricuspid valve is normal in structure. Tricuspid valve regurgitation is trivial. 8. The aortic valve is mildly thickened with mild calcification. There is mild-moderate aortic valve stenosis. AVA by VTI 1.10 cmsq, Mean gradient 18 mmHg, Peak velocity 2.8 m/s. Aortic valve regurgitation is not visualized. 9. The pulmonic valve was not well visualized. Pulmonic valve regurgitation is not visualized. 10. Normal pulmonary artery systolic pressure.  Recent Labs: 01/22/2019: Hemoglobin  15.8; Platelets 222 02/21/2019: BUN 20; Creatinine, Ser 1.20; Potassium 4.4; Sodium 144  Recent Lipid Panel No results found for: CHOL, TRIG, HDL, CHOLHDL, VLDL, LDLCALC, LDLDIRECT  Physical Exam:    VS:  BP 132/86 (BP Location: Right Arm, Patient Position: Sitting, Cuff Size: Large)   Pulse 65   Ht 5\' 11"  (1.803 m)   Wt 277 lb (125.6 kg)   SpO2 96%   BMI 38.63 kg/m     Wt Readings from Last 3 Encounters:  06/08/19 277 lb (125.6 kg)  02/24/19 279 lb (126.6 kg)  01/25/19 272 lb (123.4 kg)     GEN: Well nourished, well developed in no acute distress HEENT: Normal NECK: No JVD; No carotid bruits LYMPHATICS: No lymphadenopathy CARDIAC: S1S2 noted,RRR, 3/6 mid ejection systolic murmur murmurs, rubs, gallops RESPIRATORY:  Clear to auscultation without rales, wheezing or rhonchi  ABDOMEN: Soft, non-tender, non-distended, +bowel sounds, no guarding. EXTREMITIES: No edema, No cyanosis, no clubbing MUSCULOSKELETAL:  No deformity  SKIN: Warm and dry NEUROLOGIC:  Alert and oriented x 3, non-focal PSYCHIATRIC:  Normal affect, good insight  ASSESSMENT:    1. Essential hypertension   2. Mixed hyperlipidemia   3. Nonrheumatic aortic valve stenosis   4. Coronary artery disease involving native coronary artery of native heart without angina pectoris    PLAN:     He appears to be doing well from a cardiovascular standpoint.  Going to continue the patient his current medication regimen.  He tells me that he started a new job that he has been walking a lot.    Hypertension-blood pressure deceptively in the office we will continue the current medication regimen.  Hyperlipidemia continue patient his Crestor.  Coronary artery disease continue aspirin 81 mg daily with Crestor 20 mg daily.  Mild to moderate aortic stenosis-he is asymptomatic at this time we will continue to monitor.  Obesity-the patient understands the need to lose weight with diet and exercise. We have discussed specific  strategies for this.  The patient is in agreement with the above plan. The patient left the office in stable condition.  The patient will follow up in 6 months or sooner if needed.  Medication Adjustments/Labs and Tests Ordered: Current medicines are reviewed at length with the patient today.  Concerns regarding medicines are outlined above.  No orders of the defined types were placed in this encounter.  No orders of the defined types were placed in this encounter.   Patient Instructions  Medication Instructions:  Your physician recommends that you continue on your current medications as directed. Please  refer to the Current Medication list given to you today.  *If you need a refill on your cardiac medications before your next appointment, please call your pharmacy*   Lab Work: None ordered  If you have labs (blood work) drawn today and your tests are completely normal, you will receive your results only by: Marland Kitchen MyChart Message (if you have MyChart) OR . A paper copy in the mail If you have any lab test that is abnormal or we need to change your treatment, we will call you to review the results.   Testing/Procedures: None ordered   Follow-Up: At [facility], you and your health needs are our priority.  As part of our continuing mission to provide you with exceptional heart care, we have created designated Provider Care Teams.  These Care Teams include your primary Cardiologist (physician) and Advanced Practice Providers (APPs -  Physician Assistants and Nurse Practitioners) who all work together to provide you with the care you need, when you need it.  We recommend signing up for the patient portal called "MyChart".  Sign up information is provided on this After Visit Summary.  MyChart is used to connect with patients for Virtual Visits (Telemedicine).  Patients are able to view lab/test results, encounter notes, upcoming appointments, etc.  Non-urgent messages can be sent to your  provider as well.   To learn more about what you can do with MyChart, go to [email or web address].    Your next appointment:   6 month(s)  The format for your next appointment:   In Person  Provider:   Berniece Salines, DO   Other Instructions      Adopting a Healthy Lifestyle.  Know what a healthy weight is for you (roughly BMI <25) and aim to maintain this   Aim for 7+ servings of fruits and vegetables daily   65-80+ fluid ounces of water or unsweet tea for healthy kidneys   Limit to max 1 drink of alcohol per day; avoid smoking/tobacco   Limit animal fats in diet for cholesterol and heart health - choose grass fed whenever available   Avoid highly processed foods, and foods high in saturated/trans fats   Aim for low stress - take time to unwind and care for your mental health   Aim for 150 min of moderate intensity exercise weekly for heart health, and weights twice weekly for bone health   Aim for 7-9 hours of sleep daily   When it comes to diets, agreement about the perfect plan isnt easy to find, even among the experts. Experts at the [employer] developed an idea known as the Healthy Eating Plate. Just imagine a plate divided into logical, healthy portions.   The emphasis is on diet quality:   Load up on vegetables and fruits - one-half of your plate: Aim for color and variety, and remember that potatoes dont count.   Go for whole grains - one-quarter of your plate: Whole wheat, barley, wheat berries, quinoa, oats, brown rice, and foods made with them. If you want pasta, go with whole wheat pasta.   Protein power - one-quarter of your plate: Fish, chicken, beans, and nuts are all healthy, versatile protein sources. Limit red meat.   The diet, however, does go beyond the plate, offering a few other suggestions.   Use healthy plant oils, such as olive, canola, soy, corn, sunflower and peanut. Check the labels, and avoid partially hydrogenated  oil, which have unhealthy trans fats.   If  youre thirsty, drink water. Coffee and tea are good in moderation, but skip sugary drinks and limit milk and dairy products to one or two daily servings.   The type of carbohydrate in the diet is more important than the amount. Some sources of carbohydrates, such as vegetables, fruits, whole grains, and beans-are healthier than others.   Finally, stay active  Signed, Berniece Salines, DO  06/08/2019 9:10 AM    [place]

## 2019-06-08 NOTE — Patient Instructions (Signed)
Medication Instructions:  Your physician recommends that you continue on your current medications as directed. Please refer to the Current Medication list given to you today.  *If you need a refill on your cardiac medications before your next appointment, please call your pharmacy*   Lab Work: None ordered  If you have labs (blood work) drawn today and your tests are completely normal, you will receive your results only by: . MyChart Message (if you have MyChart) OR . A paper copy in the mail If you have any lab test that is abnormal or we need to change your treatment, we will call you to review the results.   Testing/Procedures: None ordered   Follow-Up: At [facility], you and your health needs are our priority.  As part of our continuing mission to provide you with exceptional heart care, we have created designated Provider Care Teams.  These Care Teams include your primary Cardiologist (physician) and Advanced Practice Providers (APPs -  Physician Assistants and Nurse Practitioners) who all work together to provide you with the care you need, when you need it.  We recommend signing up for the patient portal called "MyChart".  Sign up information is provided on this After Visit Summary.  MyChart is used to connect with patients for Virtual Visits (Telemedicine).  Patients are able to view lab/test results, encounter notes, upcoming appointments, etc.  Non-urgent messages can be sent to your provider as well.   To learn more about what you can do with MyChart, go to [email or web address].    Your next appointment:   6 month(s)  The format for your next appointment:   In Person  Provider:   Kardie Tobb, DO   Other Instructions  

## 2019-07-18 DIAGNOSIS — R509 Fever, unspecified: Secondary | ICD-10-CM | POA: Diagnosis not present

## 2019-08-15 ENCOUNTER — Other Ambulatory Visit: Payer: Self-pay | Admitting: Cardiology

## 2019-10-10 DIAGNOSIS — Z20822 Contact with and (suspected) exposure to covid-19: Secondary | ICD-10-CM | POA: Diagnosis not present

## 2019-10-12 DIAGNOSIS — C61 Malignant neoplasm of prostate: Secondary | ICD-10-CM | POA: Diagnosis not present

## 2019-10-18 DIAGNOSIS — C61 Malignant neoplasm of prostate: Secondary | ICD-10-CM | POA: Diagnosis not present

## 2019-10-18 DIAGNOSIS — N5201 Erectile dysfunction due to arterial insufficiency: Secondary | ICD-10-CM | POA: Diagnosis not present

## 2019-10-19 DIAGNOSIS — F331 Major depressive disorder, recurrent, moderate: Secondary | ICD-10-CM | POA: Diagnosis not present

## 2019-11-23 DIAGNOSIS — F331 Major depressive disorder, recurrent, moderate: Secondary | ICD-10-CM | POA: Diagnosis not present

## 2019-11-24 ENCOUNTER — Encounter: Payer: Self-pay | Admitting: Cardiology

## 2019-11-24 ENCOUNTER — Ambulatory Visit (INDEPENDENT_AMBULATORY_CARE_PROVIDER_SITE_OTHER): Payer: BC Managed Care – PPO | Admitting: Cardiology

## 2019-11-24 ENCOUNTER — Other Ambulatory Visit: Payer: Self-pay

## 2019-11-24 VITALS — BP 132/80 | HR 65 | Ht 71.0 in | Wt 277.0 lb

## 2019-11-24 DIAGNOSIS — I350 Nonrheumatic aortic (valve) stenosis: Secondary | ICD-10-CM

## 2019-11-24 DIAGNOSIS — E782 Mixed hyperlipidemia: Secondary | ICD-10-CM | POA: Diagnosis not present

## 2019-11-24 DIAGNOSIS — I10 Essential (primary) hypertension: Secondary | ICD-10-CM | POA: Diagnosis not present

## 2019-11-24 DIAGNOSIS — I2510 Atherosclerotic heart disease of native coronary artery without angina pectoris: Secondary | ICD-10-CM | POA: Diagnosis not present

## 2019-11-24 DIAGNOSIS — E669 Obesity, unspecified: Secondary | ICD-10-CM

## 2019-11-24 NOTE — Patient Instructions (Addendum)
Medication Instructions:  Your physician recommends that you continue on your current medications as directed. Please refer to the Current Medication list given to you today.  *If you need a refill on your cardiac medications before your next appointment, please call your pharmacy*   Lab Work: None ordered  If you have labs (blood work) drawn today and your tests are completely normal, you will receive your results only by: Marland Kitchen MyChart Message (if you have MyChart) OR . A paper copy in the mail If you have any lab test that is abnormal or we need to change your treatment, we will call you to review the results.   Testing/Procedures: None ordered   Follow-Up: At [facility], you and your health needs are our priority.  As part of our continuing mission to provide you with exceptional heart care, we have created designated Provider Care Teams.  These Care Teams include your primary Cardiologist (physician) and Advanced Practice Providers (APPs -  Physician Assistants and Nurse Practitioners) who all work together to provide you with the care you need, when you need it.  We recommend signing up for the patient portal called "MyChart".  Sign up information is provided on this After Visit Summary.  MyChart is used to connect with patients for Virtual Visits (Telemedicine).  Patients are able to view lab/test results, encounter notes, upcoming appointments, etc.  Non-urgent messages can be sent to your provider as well.   To learn more about what you can do with MyChart, go to [email or web address].    Your next appointment:   6 month(s)  The format for your next appointment:   In Person  Provider:   Berniece Salines, DO   Other Instructions

## 2019-11-24 NOTE — Progress Notes (Addendum)
Cardiology Office Note:    Date:  11/24/2019   ID:  Thomas [name], DOB November 22, 1955, MRN [id number]  PCP:  Thomas Huddle, MD  Cardiologist:  Thomas Salines, DO  Electrophysiologist:  None   Referring MD: Thomas Huddle, MD   " I am doing well"  History of Present Illness:    Thomas [name] is a 64 y.o. male with a hx of coronary artery disease 50 to 69% calcified plaque in the mid LAD, less than 49% proximal left circumflex, hypertension, hyperlipidemia, obesity, mild to moderate aortic stenosis, presents today for follow-up.  I last saw the patient on June 08, 2019 at that time he was doing well from a cardiovascular standpoint.  His blood pressure was acceptable and we will continue him on his medication regimen.   Today he is here for follow-up visit.  He is very excited, he is back home and is enjoying his family.  He started a new job and is very happy with it.   He denies any chest pain, shortness of breath, nausea, vomiting, dizziness.  Past Medical History:  Diagnosis Date  . Anemia    hx of in 1992   . Anxiety   . Arthritis    lower back   . GERD (gastroesophageal reflux disease)    rarely   . History of anxiety   . Hyperlipemia   . Hypertension   . Lower urinary tract symptoms (LUTS)   . Malignant neoplasm of prostate ([place]) 04/17/2014  . Pneumonia    hx x2 of pneumonia at age 17   . 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 Ventral hernia without obstruction or gangrene 03/27/2016    Past Surgical History:  Procedure Laterality Date  . Epididymis Excision Left    Spermocele  . HEMORROIDECTOMY    . HERNIA REPAIR Bilateral 1967 and 1986   x2 open inguinal hernia repairs  . INCISION AND DRAINAGE OF WOUND  2017   Wound Infection Post OP  . LYMPHADENECTOMY Bilateral 05/17/2014   Procedure: PELVIC LYMPHADENECTOMY;  Surgeon: Raynelle Bring, MD;  Location: [facility];  Service: Urology;  Laterality: Bilateral;  . PROSTATE BIOPSY  03/22/14   Raynelle Bring  . ROBOT  ASSISTED LAPAROSCOPIC RADICAL PROSTATECTOMY N/A 05/17/2014   Procedure: ROBOTIC ASSISTED LAPAROSCOPIC RADICAL PROSTATECTOMY LEVEL 2;  Surgeon: Raynelle Bring, MD;  Location: [facility];  Service: Urology;  Laterality: N/A;  . SKIN TAG REMOVAL     15 lesions  . VASECTOMY    . VENTRAL HERNIA REPAIR N/A 03/27/2016   Procedure: LAPAROSCOPIC VENTRAL HERNIA;  Surgeon: Alphonsa Overall, MD;  Location: [facility];  Service: General;  Laterality: N/A;  WITH MESH    Current Medications: Current Meds  Medication Sig  . aspirin (HM ASPIRIN) 81 MG chewable tablet Chew 1 tablet (81 mg total) by mouth daily.  . Cholecalciferol (VITAMIN D) 2000 units tablet Take 2,000 Units by mouth daily.  . metoprolol succinate (TOPROL-XL) 25 MG 24 hr tablet TAKE 1/2 TABLET (12.5 MG TOTAL) DAILY WITH OR IMMEDIATELY FOLLOWING A MEAL.  . Multiple Vitamins-Minerals (MULTIVITAMIN WITH MINERALS) tablet Take 1 tablet by mouth daily.  . rosuvastatin (CRESTOR) 20 MG tablet Take 1 tablet (20 mg total) by mouth daily.  . TRINTELLIX 20 MG TABS tablet Take 20 mg by mouth daily.  . valsartan-hydrochlorothiazide (DIOVAN-HCT) 320-25 MG per tablet Take 1 tablet by mouth every morning.      Allergies:   Patient has no known allergies.   Social History  Socioeconomic History  . Marital status: Married    Spouse name: Not on file  . Number of children: 2  . Years of education: Not on file  . Highest education level: Not on file  Occupational History  . Occupation: Sales  Tobacco Use  . Smoking status: Never Smoker  . Smokeless tobacco: Never Used  Substance and Sexual Activity  . Alcohol use: No    Alcohol/week: 0.0 standard drinks  . Drug use: No  . Sexual activity: Not on file  Other Topics Concern  . Not on file  Social History Narrative  . Not on file   Social Determinants of Health   Financial Resource Strain:   . Difficulty of Paying Living Expenses: Not on file  Food Insecurity:   . Worried About Charity fundraiser in the  Last Year: Not on file  . Ran Out of Food in the Last Year: Not on file  Transportation Needs:   . Lack of Transportation (Medical): Not on file  . Lack of Transportation (Non-Medical): Not on file  Physical Activity:   . Days of Exercise per Week: Not on file  . Minutes of Exercise per Session: Not on file  Stress:   . Feeling of Stress : Not on file  Social Connections:   . Frequency of Communication with Friends and Family: Not on file  . Frequency of Social Gatherings with Friends and Family: Not on file  . Attends Religious Services: Not on file  . Active Member of Clubs or Organizations: Not on file  . Attends Archivist Meetings: Not on file  . Marital Status: Not on file     Family History: The patient's family history includes COPD in his mother; Cancer in his mother; Heart attack in his mother and paternal grandfather; Heart failure in his mother; Ovarian cancer in his mother.  ROS:   Review of Systems  Constitution: Negative for decreased appetite, fever and weight gain.  HENT: Negative for congestion, ear discharge, hoarse voice and sore throat.   Eyes: Negative for discharge, redness, vision loss in right eye and visual halos.  Cardiovascular: Negative for chest pain, dyspnea on exertion, leg swelling, orthopnea and palpitations.  Respiratory: Negative for cough, hemoptysis, shortness of breath and snoring.   Endocrine: Negative for heat intolerance and polyphagia.  Hematologic/Lymphatic: Negative for bleeding problem. Does not bruise/bleed easily.  Skin: Negative for flushing, nail changes, rash and suspicious lesions.  Musculoskeletal: Negative for arthritis, joint pain, muscle cramps, myalgias, neck pain and stiffness.  Gastrointestinal: Negative for abdominal pain, bowel incontinence, diarrhea and excessive appetite.  Genitourinary: Negative for decreased libido, genital sores and incomplete emptying.  Neurological: Negative for brief paralysis, focal  weakness, headaches and loss of balance.  Psychiatric/Behavioral: Negative for altered mental status, depression and suicidal ideas.  Allergic/Immunologic: Negative for HIV exposure and persistent infections.    EKGs/Labs/Other Studies Reviewed:    The following studies were reviewed today:   EKG:  The ekg ordered today demonstrates sinus rhythm, heart rate 65 bpm.   CCTAIMPRESSION: 1. Coronary calcium score of 113. This was 56 percentile for age and sex matched control.  2. No Left main artery present. LAD and LCX with two separate origin. Left Dominance.  3. Non-obstructive Coronary artery disease with the most significant lesion being moderate (50-69%). CAD-RADS 3. This study will be sent for FFR.  TTEIMPRESSIONS: 1. Left ventricular ejection fraction, by visual estimation, is 55 to 60%. The left ventricle has normal function. Left  ventricular septal wall thickness was moderately increased. Moderately increased left ventricular posterior wall thickness. There is  moderately increased left ventricular hypertrophy. 2. Left ventricular diastolic parameters are consistent with Grade I diastolic dysfunction (impaired relaxation). 3. Global right ventricle has normal systolic function.The right ventricular size is normal. No increase in right ventricular wall thickness. 4. Left atrial size was normal. 5. Right atrial size was normal. 6. The mitral valve is normal in structure. No evidence of mitral valve regurgitation. No evidence of mitral stenosis. 7. The tricuspid valve is normal in structure. Tricuspid valve regurgitation is trivial. 8. The aortic valve is mildly thickened with mild calcification. There is mild-moderate aortic valve stenosis. AVA by VTI 1.10 cmsq, Mean gradient 18 mmHg, Peak velocity 2.8 m/s. Aortic valve regurgitation is not visualized. 9. The pulmonic valve was not well visualized. Pulmonic valve regurgitation is not visualized. 10. Normal  pulmonary artery systolic pressure.  Recent Labs: 01/22/2019: Hemoglobin 15.8; Platelets 222 02/21/2019: BUN 20; Creatinine, Ser 1.20; Potassium 4.4; Sodium 144  Recent Lipid Panel No results found for: CHOL, TRIG, HDL, CHOLHDL, VLDL, LDLCALC, LDLDIRECT  Physical Exam:    VS:  BP 132/80   Pulse 65   Ht 5\' 11"  (1.803 m)   Wt 277 lb (125.6 kg)   SpO2 96%   BMI 38.63 kg/m     Wt Readings from Last 3 Encounters:  11/24/19 277 lb (125.6 kg)  06/08/19 277 lb (125.6 kg)  02/24/19 279 lb (126.6 kg)     GEN: Well nourished, well developed in no acute distress HEENT: Normal NECK: No JVD; No carotid bruits LYMPHATICS: No lymphadenopathy CARDIAC: S1S2 noted,RRR, 3/6 mid ejection systolic murmurs, rubs, gallops RESPIRATORY:  Clear to auscultation without rales, wheezing or rhonchi  ABDOMEN: Soft, non-tender, non-distended, +bowel sounds, no guarding. EXTREMITIES: No edema, No cyanosis, no clubbing MUSCULOSKELETAL:  No deformity  SKIN: Warm and dry NEUROLOGIC:  Alert and oriented x 3, non-focal PSYCHIATRIC:  Normal affect, good insight  ASSESSMENT:    1. Essential hypertension   2. Mixed hyperlipidemia   3. Nonrheumatic aortic valve stenosis   4. Coronary artery disease involving native coronary artery of native heart without angina pectoris   5. Obesity (BMI 30-39.9)    PLAN:     1.  He appears to be doing well from a cardiovascular standpoint.  We reviewed potential symptoms to expect with coronary artery disease as well as with his aortic stenosis.  He denies any at this point.  I have educated the patient that if he ever experience chest pain, shortness of breath to notify my office.  For now he will remain on his aspirin and Crestor 20 mg daily.  2.  His blood pressure manually completed by me 132/80 mmHg.  No change will be done to his antihypertensive regimen.  3.  For his mild to moderate aortic stenosis we will continue to monitor.  He has no symptoms at this time.  4.   The patient understands the need to lose weight with diet and exercise. We have discussed specific strategies for this.  The patient is in agreement with the above plan. The patient left the office in stable condition.  The patient will follow up in 6 months or sooner if needed.   Medication Adjustments/Labs and Tests Ordered: Current medicines are reviewed at length with the patient today.  Concerns regarding medicines are outlined above.  Orders Placed This Encounter  Procedures  . EKG 12-Lead   No orders of the defined types  were placed in this encounter.   Patient Instructions  Medication Instructions:  Your physician recommends that you continue on your current medications as directed. Please refer to the Current Medication list given to you today.  *If you need a refill on your cardiac medications before your next appointment, please call your pharmacy*   Lab Work: None ordered  If you have labs (blood work) drawn today and your tests are completely normal, you will receive your results only by: Marland Kitchen MyChart Message (if you have MyChart) OR . A paper copy in the mail If you have any lab test that is abnormal or we need to change your treatment, we will call you to review the results.   Testing/Procedures: None ordered   Follow-Up: At [facility], you and your health needs are our priority.  As part of our continuing mission to provide you with exceptional heart care, we have created designated Provider Care Teams.  These Care Teams include your primary Cardiologist (physician) and Advanced Practice Providers (APPs -  Physician Assistants and Nurse Practitioners) who all work together to provide you with the care you need, when you need it.  We recommend signing up for the patient portal called "MyChart".  Sign up information is provided on this After Visit Summary.  MyChart is used to connect with patients for Virtual Visits (Telemedicine).  Patients are able to view  lab/test results, encounter notes, upcoming appointments, etc.  Non-urgent messages can be sent to your provider as well.   To learn more about what you can do with MyChart, go to [email or web address].    Your next appointment:   6 month(s)  The format for your next appointment:   In Person  Provider:   Berniece Salines, DO   Other Instructions     Adopting a Healthy Lifestyle.  Know what a healthy weight is for you (roughly BMI <25) and aim to maintain this   Aim for 7+ servings of fruits and vegetables daily   65-80+ fluid ounces of water or unsweet tea for healthy kidneys   Limit to max 1 drink of alcohol per day; avoid smoking/tobacco   Limit animal fats in diet for cholesterol and heart health - choose grass fed whenever available   Avoid highly processed foods, and foods high in saturated/trans fats   Aim for low stress - take time to unwind and care for your mental health   Aim for 150 min of moderate intensity exercise weekly for heart health, and weights twice weekly for bone health   Aim for 7-9 hours of sleep daily   When it comes to diets, agreement about the perfect plan isnt easy to find, even among the experts. Experts at the [employer] developed an idea known as the Healthy Eating Plate. Just imagine a plate divided into logical, healthy portions.   The emphasis is on diet quality:   Load up on vegetables and fruits - one-half of your plate: Aim for color and variety, and remember that potatoes dont count.   Go for whole grains - one-quarter of your plate: Whole wheat, barley, wheat berries, quinoa, oats, brown rice, and foods made with them. If you want pasta, go with whole wheat pasta.   Protein power - one-quarter of your plate: Fish, chicken, beans, and nuts are all healthy, versatile protein sources. Limit red meat.   The diet, however, does go beyond the plate, offering a few other suggestions.   Use healthy plant oils, such  as olive, canola, soy, corn, sunflower and peanut. Check the labels, and avoid partially hydrogenated oil, which have unhealthy trans fats.   If youre thirsty, drink water. Coffee and tea are good in moderation, but skip sugary drinks and limit milk and dairy products to one or two daily servings.   The type of carbohydrate in the diet is more important than the amount. Some sources of carbohydrates, such as vegetables, fruits, whole grains, and beans-are healthier than others.   Finally, stay active  Signed, Thomas Salines, DO  11/24/2019 8:59 AM    [place]

## 2019-11-29 DIAGNOSIS — Z23 Encounter for immunization: Secondary | ICD-10-CM | POA: Diagnosis not present

## 2019-11-29 DIAGNOSIS — R109 Unspecified abdominal pain: Secondary | ICD-10-CM | POA: Diagnosis not present

## 2019-12-06 DIAGNOSIS — F331 Major depressive disorder, recurrent, moderate: Secondary | ICD-10-CM | POA: Diagnosis not present

## 2019-12-27 DIAGNOSIS — F331 Major depressive disorder, recurrent, moderate: Secondary | ICD-10-CM | POA: Diagnosis not present

## 2020-01-10 DIAGNOSIS — F331 Major depressive disorder, recurrent, moderate: Secondary | ICD-10-CM | POA: Diagnosis not present

## 2020-01-15 DIAGNOSIS — I10 Essential (primary) hypertension: Secondary | ICD-10-CM | POA: Diagnosis not present

## 2020-01-15 DIAGNOSIS — E669 Obesity, unspecified: Secondary | ICD-10-CM | POA: Diagnosis not present

## 2020-01-15 DIAGNOSIS — R109 Unspecified abdominal pain: Secondary | ICD-10-CM | POA: Diagnosis not present

## 2020-01-24 DIAGNOSIS — F331 Major depressive disorder, recurrent, moderate: Secondary | ICD-10-CM | POA: Diagnosis not present

## 2020-02-07 DIAGNOSIS — F331 Major depressive disorder, recurrent, moderate: Secondary | ICD-10-CM | POA: Diagnosis not present

## 2020-02-13 ENCOUNTER — Other Ambulatory Visit: Payer: Self-pay | Admitting: Cardiology

## 2020-02-13 NOTE — Telephone Encounter (Signed)
Both Rx refill sent to pharmacy.

## 2020-02-21 DIAGNOSIS — F331 Major depressive disorder, recurrent, moderate: Secondary | ICD-10-CM | POA: Diagnosis not present

## 2020-03-04 DIAGNOSIS — E782 Mixed hyperlipidemia: Secondary | ICD-10-CM | POA: Diagnosis not present

## 2020-03-04 IMAGING — CT CT HEART MORP W/ CTA COR W/ SCORE W/ CA W/CM &/OR W/O CM
1 series · 13 of 20 positions shown, 17 images · IV contrast (APPLIED)
Comparison: Chest radiograph 01/22/2019 no prior CT.
COMPARISON: Chest radiograph 01/22/2019 no prior CT.

Addendum:
EXAM:
OVER-READ INTERPRETATION  CT CHEST

The following report is an over-read performed by radiologist Dr.
over-read does not include interpretation of cardiac or coronary
anatomy or pathology. The coronary CTA interpretation by the
cardiologist is attached.
CLINICAL DATA: 63 year old male with chest pain.
Cardiac/Coronary  CT
TECHNIQUE: The patient was scanned on a [employer] Force scanner.

[Series 603: coronal · coronal · 0.40mm/px · 13 of 95 slices shown, 17 images]
[im 5/95  vessel]
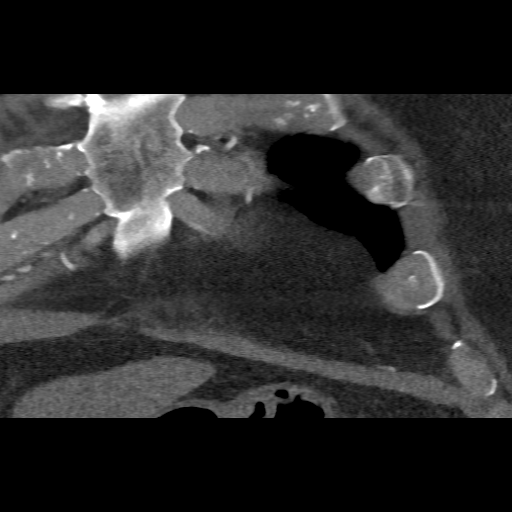
[im 5/95  lung]
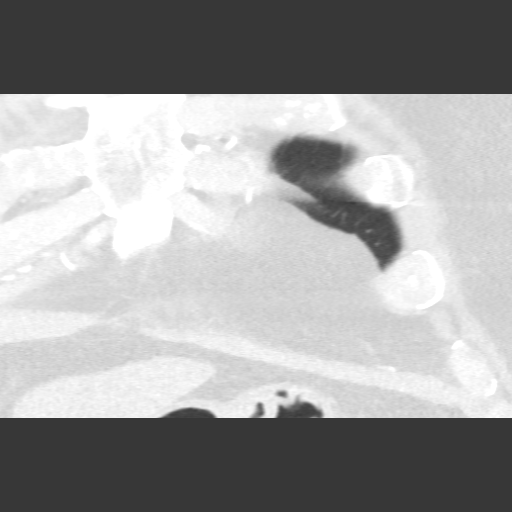
[im 15/95  vessel]
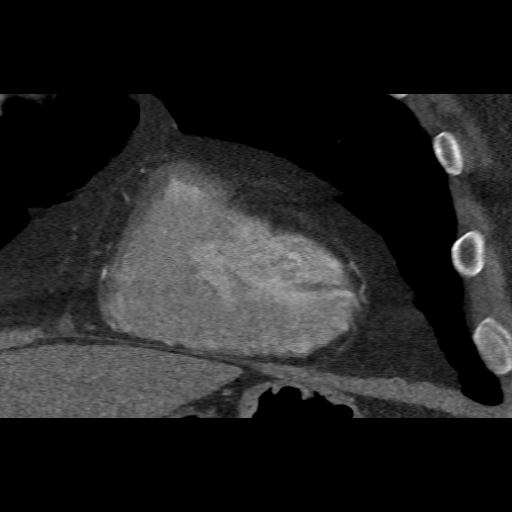
[im 20/95  vessel]
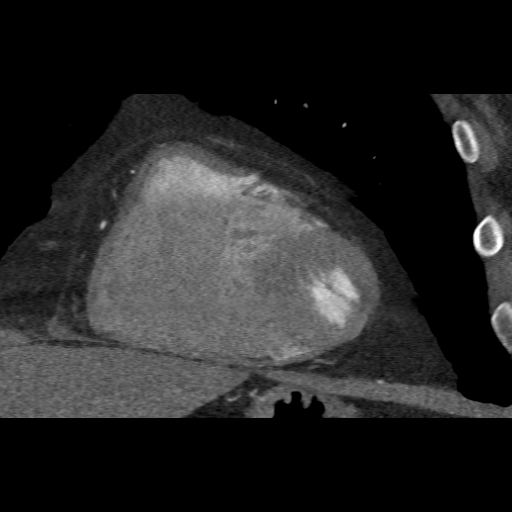
[im 25/95  vessel]
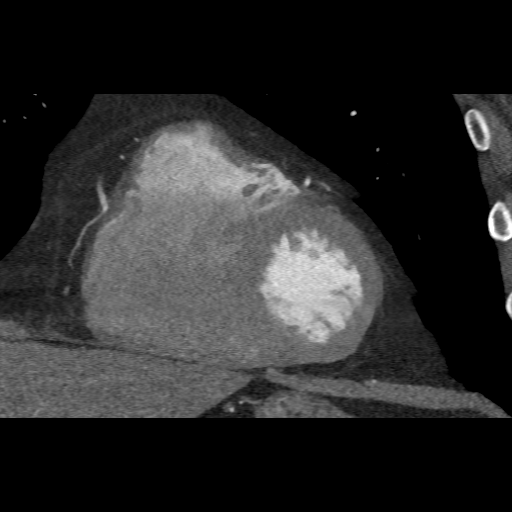
[im 35/95  vessel]
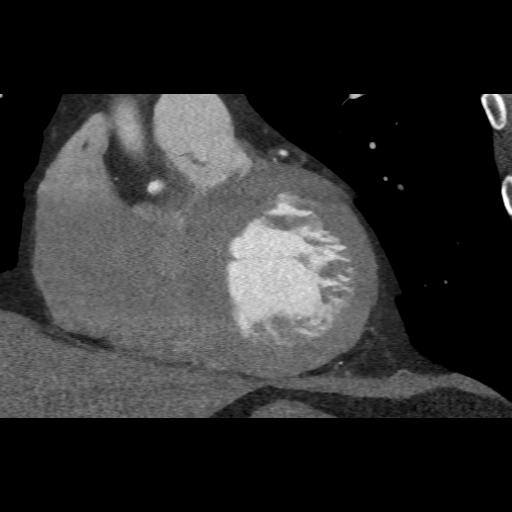
[im 35/95  lung]
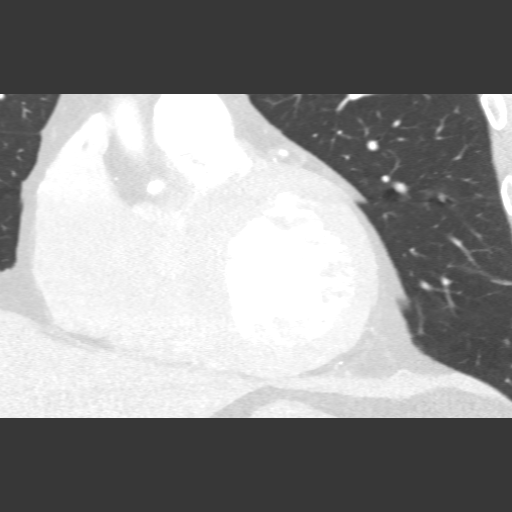
[im 40/95  vessel]
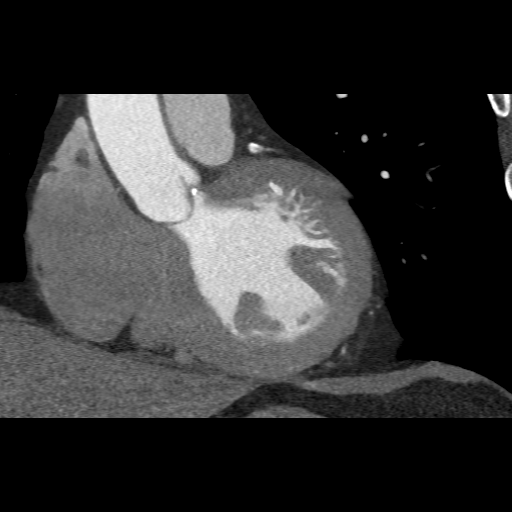
[im 50/95  vessel]
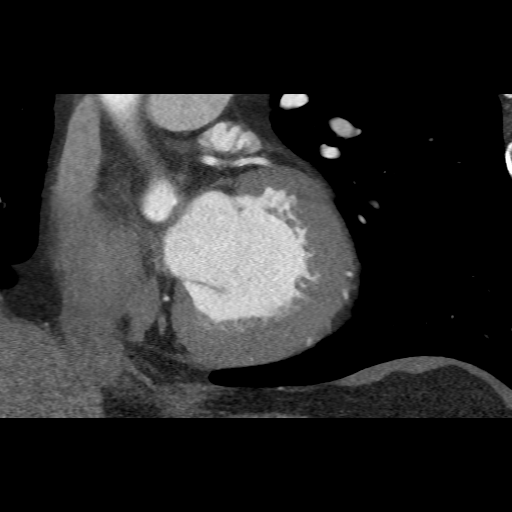
[im 55/95  vessel]
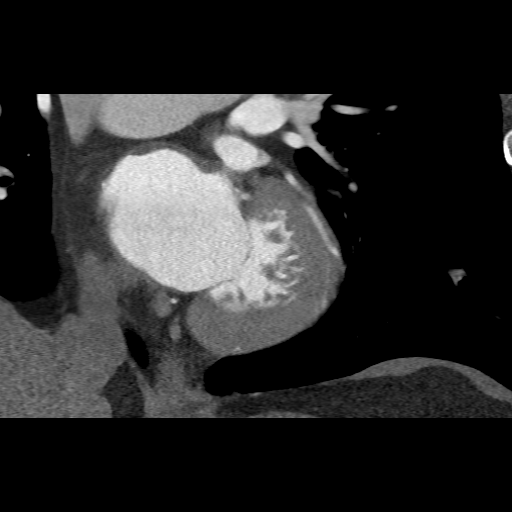
[im 60/95  vessel]
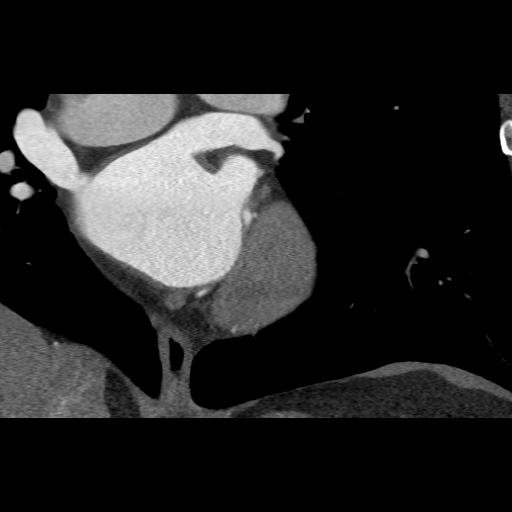
[im 60/95  lung]
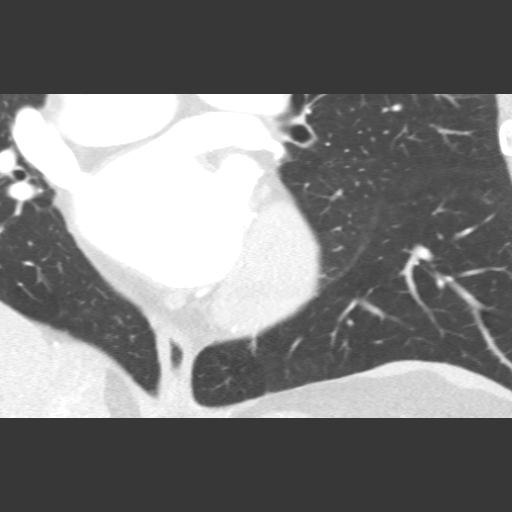
[im 70/95  vessel]
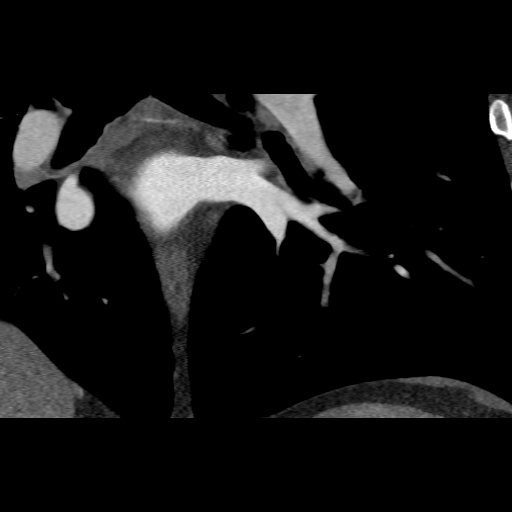
[im 75/95  vessel]
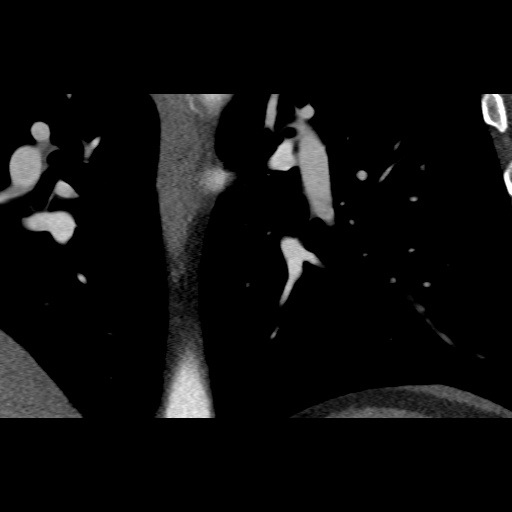
[im 80/95  vessel]
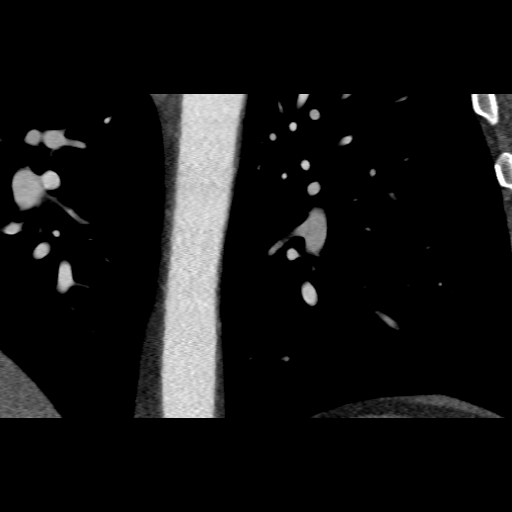
[im 90/95  vessel]
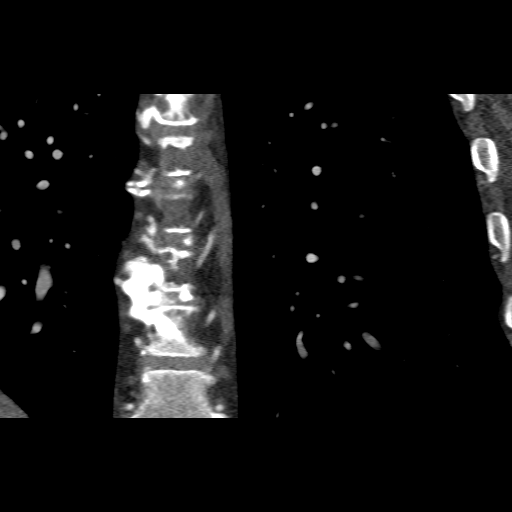
[im 90/95  lung]
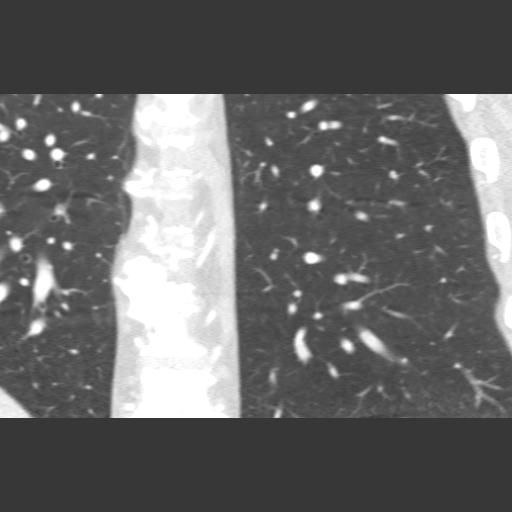

[13 of 20 positions shown; findings below may reference images not displayed]

FINDINGS: Vascular: Normal aortic caliber, without evidence of dissection. No
central pulmonary embolism, on this non-dedicated study.

Mediastinum/Nodes: No imaged thoracic adenopathy.

Lungs/Pleura: No pleural fluid.  Clear imaged lungs.

Upper Abdomen: Normal imaged portions of the liver, spleen, stomach.

Musculoskeletal: Midthoracic spondylosis.
IMPRESSION: No acute findings in the imaged extracardiac chest.
FINDINGS: A 120 kV prospective scan was triggered in the descending thoracic
aorta at 111 HU's. Axial non-contrast 3 mm slices were carried out
through the heart. The data set was analyzed on a dedicated work
station and scored using the Agatson method. Gantry rotation speed
was 250 msecs and collimation was .6 mm. No beta blockade and 0.8 mg
of sl NTG was given. The 3D data set was reconstructed in 5%
intervals of the 67-82 % of the R-R cycle. Diastolic phases were
analyzed on a dedicated work station using MPR, MIP and VRT modes.
The patient received 80 cc of contrast.

Aorta: Normal size.  No calcifications.  No dissection.

Aortic Valve: Trileaflet with calcifications.

Coronary Arteries: There is no visible left main artery. The LAD and
LCX have separate ostial from the aortic sinus. Left dominance.

RCA is a medium caliber vessel and does not supply the PDA. There is
no plaque.

Left main: Anomaly-No left main artery. The LAD and LCX have
separate ostial from the aortic sinus.

LAD is a large vessel that has a mid vessel 50-69% calcified plaque.
Distal to the calcified plaque, there is a mid vessel minimal <25%
soft/ non calcified plaque. D1 with no plaque.

LCX is a dominant artery that gives rise to one large OM1 branch.
There is a proximal 25-49% calcified plaque. Mid LCX with minimal
<25% calcified plaque. OM1 with no plaque.

Other findings:

Normal pulmonary vein drainage into the left atrium with no evidence
of stenosis.

Normal let atrial appendage without a thrombus.

Normal size of the pulmonary artery.
IMPRESSION: 1. Coronary calcium score of 113. This was 87 percentile for age and
sex matched control.

2. No Left main artery present. LAD and LCX with two separate
origin. Left Dominance.

3. Non-obstructive Coronary artery disease with the most significant
lesion being moderate (50-69%). CAD-RADS 3. This study will be sent
for FFR.

Alley Wharton, DO

*** End of Addendum ***
EXAM:
OVER-READ INTERPRETATION  CT CHEST

The following report is an over-read performed by radiologist Dr.
over-read does not include interpretation of cardiac or coronary
anatomy or pathology. The coronary CTA interpretation by the
cardiologist is attached.
FINDINGS: Vascular: Normal aortic caliber, without evidence of dissection. No
central pulmonary embolism, on this non-dedicated study.

Mediastinum/Nodes: No imaged thoracic adenopathy.

Lungs/Pleura: No pleural fluid.  Clear imaged lungs.

Upper Abdomen: Normal imaged portions of the liver, spleen, stomach.

Musculoskeletal: Midthoracic spondylosis.
IMPRESSION: No acute findings in the imaged extracardiac chest.

## 2020-03-06 DIAGNOSIS — F331 Major depressive disorder, recurrent, moderate: Secondary | ICD-10-CM | POA: Diagnosis not present

## 2020-03-11 DIAGNOSIS — E782 Mixed hyperlipidemia: Secondary | ICD-10-CM | POA: Diagnosis not present

## 2020-03-11 DIAGNOSIS — R109 Unspecified abdominal pain: Secondary | ICD-10-CM | POA: Diagnosis not present

## 2020-03-11 DIAGNOSIS — I350 Nonrheumatic aortic (valve) stenosis: Secondary | ICD-10-CM | POA: Diagnosis not present

## 2020-03-11 DIAGNOSIS — Z0000 Encounter for general adult medical examination without abnormal findings: Secondary | ICD-10-CM | POA: Diagnosis not present

## 2020-03-20 DIAGNOSIS — F331 Major depressive disorder, recurrent, moderate: Secondary | ICD-10-CM | POA: Diagnosis not present

## 2020-04-15 ENCOUNTER — Ambulatory Visit
Admission: RE | Admit: 2020-04-15 | Discharge: 2020-04-15 | Disposition: A | Discharge status: Home / Self Care | Payer: BC Managed Care – PPO | Source: Ambulatory Visit | Attending: Internal Medicine | Admitting: Internal Medicine

## 2020-04-15 ENCOUNTER — Other Ambulatory Visit: Payer: Self-pay | Admitting: Internal Medicine

## 2020-04-15 DIAGNOSIS — R0789 Other chest pain: Secondary | ICD-10-CM | POA: Diagnosis not present

## 2020-04-15 DIAGNOSIS — I2510 Atherosclerotic heart disease of native coronary artery without angina pectoris: Secondary | ICD-10-CM | POA: Diagnosis not present

## 2020-04-15 DIAGNOSIS — J984 Other disorders of lung: Secondary | ICD-10-CM | POA: Diagnosis not present

## 2020-04-15 DIAGNOSIS — G8929 Other chronic pain: Secondary | ICD-10-CM

## 2020-04-15 DIAGNOSIS — J8410 Pulmonary fibrosis, unspecified: Secondary | ICD-10-CM | POA: Diagnosis not present

## 2020-04-15 DIAGNOSIS — Z8546 Personal history of malignant neoplasm of prostate: Secondary | ICD-10-CM | POA: Diagnosis not present

## 2020-05-16 DIAGNOSIS — F331 Major depressive disorder, recurrent, moderate: Secondary | ICD-10-CM | POA: Diagnosis not present

## 2020-05-21 ENCOUNTER — Ambulatory Visit: Payer: BC Managed Care – PPO | Admitting: Cardiology

## 2020-05-21 ENCOUNTER — Other Ambulatory Visit: Payer: Self-pay

## 2020-05-21 ENCOUNTER — Encounter: Payer: Self-pay | Admitting: Cardiology

## 2020-05-21 VITALS — BP 112/68 | HR 62 | Ht 71.0 in | Wt 286.0 lb

## 2020-05-21 DIAGNOSIS — M5430 Sciatica, unspecified side: Secondary | ICD-10-CM | POA: Insufficient documentation

## 2020-05-21 DIAGNOSIS — R079 Chest pain, unspecified: Secondary | ICD-10-CM | POA: Insufficient documentation

## 2020-05-21 DIAGNOSIS — L03221 Cellulitis of neck: Secondary | ICD-10-CM | POA: Insufficient documentation

## 2020-05-21 DIAGNOSIS — L723 Sebaceous cyst: Secondary | ICD-10-CM | POA: Insufficient documentation

## 2020-05-21 DIAGNOSIS — I10 Essential (primary) hypertension: Secondary | ICD-10-CM | POA: Diagnosis not present

## 2020-05-21 DIAGNOSIS — M791 Myalgia, unspecified site: Secondary | ICD-10-CM | POA: Insufficient documentation

## 2020-05-21 DIAGNOSIS — R011 Cardiac murmur, unspecified: Secondary | ICD-10-CM | POA: Insufficient documentation

## 2020-05-21 DIAGNOSIS — H9190 Unspecified hearing loss, unspecified ear: Secondary | ICD-10-CM | POA: Insufficient documentation

## 2020-05-21 DIAGNOSIS — R350 Frequency of micturition: Secondary | ICD-10-CM | POA: Insufficient documentation

## 2020-05-21 DIAGNOSIS — E669 Obesity, unspecified: Secondary | ICD-10-CM | POA: Diagnosis not present

## 2020-05-21 DIAGNOSIS — L0211 Cutaneous abscess of neck: Secondary | ICD-10-CM | POA: Insufficient documentation

## 2020-05-21 DIAGNOSIS — N400 Enlarged prostate without lower urinary tract symptoms: Secondary | ICD-10-CM | POA: Insufficient documentation

## 2020-05-21 DIAGNOSIS — F4320 Adjustment disorder, unspecified: Secondary | ICD-10-CM | POA: Insufficient documentation

## 2020-05-21 DIAGNOSIS — I350 Nonrheumatic aortic (valve) stenosis: Secondary | ICD-10-CM | POA: Diagnosis not present

## 2020-05-21 DIAGNOSIS — H9319 Tinnitus, unspecified ear: Secondary | ICD-10-CM | POA: Insufficient documentation

## 2020-05-21 DIAGNOSIS — F39 Unspecified mood [affective] disorder: Secondary | ICD-10-CM | POA: Insufficient documentation

## 2020-05-21 DIAGNOSIS — I2510 Atherosclerotic heart disease of native coronary artery without angina pectoris: Secondary | ICD-10-CM | POA: Diagnosis not present

## 2020-05-21 DIAGNOSIS — E7800 Pure hypercholesterolemia, unspecified: Secondary | ICD-10-CM | POA: Insufficient documentation

## 2020-05-21 DIAGNOSIS — R3915 Urgency of urination: Secondary | ICD-10-CM | POA: Insufficient documentation

## 2020-05-21 DIAGNOSIS — E559 Vitamin D deficiency, unspecified: Secondary | ICD-10-CM | POA: Insufficient documentation

## 2020-05-21 DIAGNOSIS — Z0001 Encounter for general adult medical examination with abnormal findings: Secondary | ICD-10-CM | POA: Insufficient documentation

## 2020-05-21 DIAGNOSIS — R198 Other specified symptoms and signs involving the digestive system and abdomen: Secondary | ICD-10-CM | POA: Insufficient documentation

## 2020-05-21 DIAGNOSIS — R972 Elevated prostate specific antigen [PSA]: Secondary | ICD-10-CM | POA: Insufficient documentation

## 2020-05-21 DIAGNOSIS — Z79899 Other long term (current) drug therapy: Secondary | ICD-10-CM | POA: Insufficient documentation

## 2020-05-21 NOTE — Progress Notes (Signed)
Cardiology Office Note:    Date:  05/21/2020   ID:  Thomas [name], DOB 1955-03-27, MRN [id number]  PCP:  Josetta Huddle, MD  Cardiologist:  Berniece Salines, DO  Electrophysiologist:  None   Referring MD: Josetta Huddle, MD   I am doing well  History of Present Illness:    Thomas [name] is a 65 y.o. male with a hx of coronary artery disease-hypertension, hyperlipidemia, obesity, mild to moderate aortic stenosis, presents today for follow-up.  During his last visit he was status post coronary CTA at that time we discussed the results from his CTA and his echo.    I last saw the patient in March 2021 at that time he appeared to be doing well from a cardiovascular standpoint.  He is here today for follow-up visit.  He still appears to be happy.  He still living at home.  He offers no complaints at this time.  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  Past Medical History:  Diagnosis Date  . Anemia    hx of in 1992   . Anxiety   . Arthritis    lower back   . GERD (gastroesophageal reflux disease)    rarely   . History of anxiety   . Hyperlipemia   . Hypertension   . Lower urinary tract symptoms (LUTS)   . Malignant neoplasm of prostate (HCC) 04/17/2014  . Pneumonia    hx x2 of pneumonia at age 43   . 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 Ventral hernia without obstruction or gangrene 03/27/2016    Past Surgical History:  Procedure Laterality Date  . Epididymis Excision Left    Spermocele  . HEMORROIDECTOMY    . HERNIA REPAIR Bilateral 1967 and 1986   x2 open inguinal hernia repairs  . INCISION AND DRAINAGE OF WOUND  2017   Wound Infection Post OP  . LYMPHADENECTOMY Bilateral 05/17/2014   Procedure: PELVIC LYMPHADENECTOMY;  Surgeon: Lester Borden, MD;  Location: [facility];  Service: Urology;  Laterality: Bilateral;  . PROSTATE BIOPSY  03/22/14   Lester Borden  . ROBOT ASSISTED LAPAROSCOPIC RADICAL PROSTATECTOMY N/A 05/17/2014   Procedure: ROBOTIC ASSISTED LAPAROSCOPIC RADICAL  PROSTATECTOMY LEVEL 2;  Surgeon: Lester Borden, MD;  Location: [facility];  Service: Urology;  Laterality: N/A;  . SKIN TAG REMOVAL     15  lesions  . VASECTOMY    . VENTRAL HERNIA REPAIR N/A 03/27/2016   Procedure: LAPAROSCOPIC VENTRAL HERNIA;  Surgeon: Alphonsa Overall, MD;  Location: [facility];  Service: General;  Laterality: N/A;  WITH MESH    Current Medications: Current Meds  Medication Sig  . aspirin (HM ASPIRIN) 81 MG chewable tablet Chew 1 tablet (81 mg total) by mouth daily.  . Cholecalciferol (VITAMIN D) 2000 units tablet Take 2,000 Units by mouth daily.  . metoprolol succinate (TOPROL-XL) 25 MG 24 hr tablet TAKE 1/2 TABLET (12.5 MG TOTAL) DAILY WITH OR IMMEDIATELY FOLLOWING A MEAL.  . Multiple Vitamins-Minerals (MULTIVITAMIN WITH MINERALS) tablet Take 1 tablet by mouth daily.  . rosuvastatin (CRESTOR) 20 MG tablet TAKE 1 TABLET BY MOUTH EVERY DAY  . TRINTELLIX 20 MG TABS tablet Take 20 mg by mouth daily.  . valsartan-hydrochlorothiazide (DIOVAN-HCT) 320-25 MG per tablet Take 1 tablet by mouth every morning.      Allergies:   Amoxicillin-pot clavulanate and Topiramate   Social History   Socioeconomic History  . Marital status: Married    Spouse name: Not on file  . Number of  children: 2  . Years of education: Not on file  . Highest education level: Not on file  Occupational History  . Occupation: Sales  Tobacco Use  . Smoking status: Never Smoker  . Smokeless tobacco: Never Used  Substance and Sexual Activity  . Alcohol use: No    Alcohol/week: 0.0 standard drinks  . Drug use: No  . Sexual activity: Not on file  Other Topics Concern  . Not on file  Social History Narrative  . Not on file   Social Determinants of Health   Financial Resource Strain: Not on file  Food Insecurity: Not on file  Transportation Needs: Not on file  Physical Activity: Not on file  Stress: Not on file  Social Connections: Not on file     Family History: The patient's family history  includes COPD in his mother; Cancer in his mother; Heart attack in his mother and paternal grandfather; Heart failure in his mother; Ovarian cancer in his mother.  ROS:   Review of Systems  Constitution: Negative for decreased appetite, fever and weight gain.  HENT: Negative for congestion, ear discharge, hoarse voice and sore throat.   Eyes: Negative for discharge, redness, vision loss in right eye and visual halos.  Cardiovascular: Negative for chest pain, dyspnea on exertion, leg swelling, orthopnea and palpitations.  Respiratory: Negative for cough, hemoptysis, shortness of breath and snoring.   Endocrine: Negative for heat intolerance and polyphagia.  Hematologic/Lymphatic: Negative for bleeding problem. Does not bruise/bleed easily.  Skin: Negative for flushing, nail changes, rash and suspicious lesions.  Musculoskeletal: Negative for arthritis, joint pain, muscle cramps, myalgias, neck pain and stiffness.  Gastrointestinal: Negative for abdominal pain, bowel incontinence, diarrhea and excessive appetite.  Genitourinary: Negative for decreased libido, genital sores and incomplete emptying.  Neurological: Negative for brief paralysis, focal weakness, headaches and loss of balance.  Psychiatric/Behavioral: Negative for altered mental status, depression and suicidal ideas.  Allergic/Immunologic: Negative for HIV exposure and persistent infections.    EKGs/Labs/Other Studies Reviewed:    The following studies were reviewed today:   EKG:  The ekg ordered today demonstrates   CCTAIMPRESSION: 1. Coronary calcium score of 113. This was 12 percentile for age and sex matched control.  2. No Left main artery present. LAD and LCX with two separate origin. Left Dominance.  3. Non-obstructive Coronary artery disease with the most significant lesion being moderate (50-69%). CAD-RADS 3. This study will be sent for FFR.  TTEIMPRESSIONS: 1. Left ventricular ejection fraction, by  visual estimation, is 55 to 60%. The left ventricle has normal function. Left ventricular septal wall thickness was moderately increased. Moderately increased left ventricular posterior wall thickness. There is  moderately increased left ventricular hypertrophy. 2. Left ventricular diastolic parameters are consistent with Grade I diastolic dysfunction (impaired relaxation). 3. Global right ventricle has normal systolic function.The right ventricular size is normal. No increase in right ventricular wall thickness. 4. Left atrial size was normal. 5. Right atrial size was normal. 6. The mitral valve is normal in structure. No evidence of mitral valve regurgitation. No evidence of mitral stenosis. 7. The tricuspid valve is normal in structure. Tricuspid valve regurgitation is trivial. 8. The aortic valve is mildly thickened with mild calcification. There is mild-moderate aortic valve stenosis. AVA by VTI 1.10 cmsq, Mean gradient 18 mmHg, Peak velocity 2.8 m/s. Aortic valve regurgitation is not visualized. 9. The pulmonic valve was not well visualized. Pulmonic valve regurgitation is not visualized. 10. Normal pulmonary artery systolic pressure  Recent Labs: No  results found for requested labs within last 8760 hours.  Recent Lipid Panel No results found for: CHOL, TRIG, HDL, CHOLHDL, VLDL, LDLCALC, LDLDIRECT  Physical Exam:    VS:  BP 112/68   Pulse 62   Ht 5\' 11"  (1.803 m)   Wt 286 lb (129.7 kg)   SpO2 95%   BMI 39.89 kg/m     Wt Readings from Last 3 Encounters:  05/21/20 286 lb (129.7 kg)  11/24/19 277 lb (125.6 kg)  06/08/19 277 lb (125.6 kg)     GEN: Well nourished, well developed in no acute distress HEENT: Normal NECK: No JVD; No carotid bruits LYMPHATICS: No lymphadenopathy CARDIAC: S1S2 noted,RRR, 3/6 mid systolic ejection murmurs, rubs, gallops RESPIRATORY:  Clear to auscultation without rales, wheezing or rhonchi  ABDOMEN: Soft, non-tender, non-distended, +bowel  sounds, no guarding. EXTREMITIES: No edema, No cyanosis, no clubbing MUSCULOSKELETAL:  No deformity  SKIN: Warm and dry NEUROLOGIC:  Alert and oriented x 3, non-focal PSYCHIATRIC:  Normal affect, good insight  ASSESSMENT:    1. Coronary artery disease involving native coronary artery of native heart without angina pectoris   2. Aortic valve stenosis, etiology of cardiac valve disease unspecified   3. Essential hypertension   4. Nonrheumatic aortic valve stenosis   5. Obesity (BMI 30-39.9)    PLAN:     He appears to be doing well from a cardiovascular standpoint.  No changes will be made to his medication regimen. Insert hypertension continue patient on valsartan/hydrochlorothiazide 322-25 mg therapy continue patient on metoprolol succinate 20 mg IV Hyperlipidemia - continue with current statin medication. The patient understands the need to lose weight with diet and exercise. We have discussed specific strategies for this.  The patient is in agreement with the above plan. The patient left the office in stable condition.  The patient will follow up in 1 year or sooner if needed.   Medication Adjustments/Labs and Tests Ordered: Current medicines are reviewed at length with the patient today.  Concerns regarding medicines are outlined above.  No orders of the defined types were placed in this encounter.  No orders of the defined types were placed in this encounter.   Patient Instructions  Medication Instructions:  Your physician recommends that you continue on your current medications as directed. Please refer to the Current Medication list given to you today.  *If you need a refill on your cardiac medications before your next appointment, please call your pharmacy*   Lab Work: NONE If you have labs (blood work) drawn today and your tests are completely normal, you will receive your results only by: Marland Kitchen MyChart Message (if you have MyChart) OR . A paper copy in the mail If you  have any lab test that is abnormal or we need to change your treatment, we will call you to review the results.   Testing/Procedures: NONE   Follow-Up: At [facility], you and your health needs are our priority.  As part of our continuing mission to provide you with exceptional heart care, we have created designated Provider Care Teams.  These Care Teams include your primary Cardiologist (physician) and Advanced Practice Providers (APPs -  Physician Assistants and Nurse Practitioners) who all work together to provide you with the care you need, when you need it.  We recommend signing up for the patient portal called "MyChart".  Sign up information is provided on this After Visit Summary.  MyChart is used to connect with patients for Virtual Visits (Telemedicine).  Patients are  able to view lab/test results, encounter notes, upcoming appointments, etc.  Non-urgent messages can be sent to your provider as well.   To learn more about what you can do with MyChart, go to [email or web address].    Your next appointment:   1 year(s)  The format for your next appointment:   In Person  Provider:   Berniece Salines, DO   Other Instructions      Adopting a Healthy Lifestyle.  Know what a healthy weight is for you (roughly BMI <25) and aim to maintain this   Aim for 7+ servings of fruits and vegetables daily   65-80+ fluid ounces of water or unsweet tea for healthy kidneys   Limit to max 1 drink of alcohol per day; avoid smoking/tobacco   Limit animal fats in diet for cholesterol and heart health - choose grass fed whenever available   Avoid highly processed foods, and foods high in saturated/trans fats   Aim for low stress - take time to unwind and care for your mental health   Aim for 150 min of moderate intensity exercise weekly for heart health, and weights twice weekly for bone health   Aim for 7-9 hours of sleep daily   When it comes to diets, agreement about the perfect  plan isnt easy to find, even among the experts. Experts at the [facility] developed an idea known as the Healthy Eating Plate. Just imagine a plate divided into logical, healthy portions.   The emphasis is on diet quality:   Load up on vegetables and fruits - one-half of your plate: Aim for color and variety, and remember that potatoes dont count.   Go for whole grains - one-quarter of your plate: Whole wheat, barley, wheat berries, quinoa, oats, brown rice, and foods made with them. If you want pasta, go with whole wheat pasta.   Protein power - one-quarter of your plate: Fish, chicken, beans, and nuts are all healthy, versatile protein sources. Limit red meat.   The diet, however, does go beyond the plate, offering a few other suggestions.   Use healthy plant oils, such as olive, canola, soy, corn, sunflower and peanut. Check the labels, and avoid partially hydrogenated oil, which have unhealthy trans fats.   If youre thirsty, drink water. Coffee and tea are good in moderation, but skip sugary drinks and limit milk and dairy products to one or two daily servings.   The type of carbohydrate in the diet is more important than the amount. Some sources of carbohydrates, such as vegetables, fruits, whole grains, and beans-are healthier than others.   Finally, stay active  Signed, Berniece Salines, DO  05/21/2020 8:26 AM    [facility]

## 2020-05-21 NOTE — Patient Instructions (Signed)

## 2020-05-29 ENCOUNTER — Ambulatory Visit (INDEPENDENT_AMBULATORY_CARE_PROVIDER_SITE_OTHER): Payer: BC Managed Care – PPO

## 2020-05-29 ENCOUNTER — Encounter: Payer: Self-pay | Admitting: Cardiology

## 2020-05-29 ENCOUNTER — Other Ambulatory Visit: Payer: Self-pay

## 2020-05-29 ENCOUNTER — Ambulatory Visit: Payer: BC Managed Care – PPO | Admitting: Cardiology

## 2020-05-29 VITALS — BP 148/78 | HR 70 | Ht 71.0 in | Wt 292.0 lb

## 2020-05-29 DIAGNOSIS — E7800 Pure hypercholesterolemia, unspecified: Secondary | ICD-10-CM

## 2020-05-29 DIAGNOSIS — R002 Palpitations: Secondary | ICD-10-CM | POA: Insufficient documentation

## 2020-05-29 DIAGNOSIS — I350 Nonrheumatic aortic (valve) stenosis: Secondary | ICD-10-CM | POA: Diagnosis not present

## 2020-05-29 DIAGNOSIS — I2510 Atherosclerotic heart disease of native coronary artery without angina pectoris: Secondary | ICD-10-CM

## 2020-05-29 MED ORDER — METOPROLOL SUCCINATE ER 25 MG PO TB24: 25.0000 mg | ORAL_TABLET | Freq: Every day | ORAL | 3 refills | Status: DC

## 2020-05-29 NOTE — Progress Notes (Unsigned)
Cardiology Office Note:    Date:  05/29/2020   ID:  Thomas [name], DOB Sep 12, 1955, MRN [id number]  PCP:  Thomas Huddle, MD  Cardiologist:  Thomas Campus, MD    Referring MD: Thomas Huddle, MD   Chief Complaint  Patient presents with  . Low pulse  . Fatigue    History of Present Illness:    Thomas [name] is a 65 y.o. male with past medical history significant for coronary artery disease as proven by coronary CT angio.  Likely on fractional flow reserve which he only got distal disease, mild to moderate aortic stenosis last time evaluated more than a year ago, dyslipidemia, essential hypertension.  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  He got very worried about it today he started feeling not right he is complaining of being weak tired exhausted some weakness in the leg.  He check his heart rate and pause quite frequently and he noticed some irregularities.  He did have EKG done today which showed PVCs.  And denies having any chest pain tightness squeezing pressure burning chest but complain of having fatigue tiredness may be dizzy but not to the point of passing out.  He never passed out in his life.  Past Medical History:  Diagnosis Date  . Anemia    hx of in 1992   . Anxiety   . Arthritis    lower back   . GERD (gastroesophageal reflux disease)    rarely   . History of anxiety   . Hyperlipemia   . Hypertension   . Lower urinary tract symptoms (LUTS)   . Malignant neoplasm of prostate ([place]) 04/17/2014  . Pneumonia    hx x2 of pneumonia at age 35   . Prostate cancer ([place]) 03/22/14   Adenocarcinoma  . Ventral hernia without obstruction or gangrene 03/27/2016    Past Surgical History:  Procedure Laterality Date  . Epididymis Excision Left    Spermocele  . HEMORROIDECTOMY    . HERNIA REPAIR Bilateral 1967 and 1986   x2 open inguinal hernia repairs  . INCISION AND DRAINAGE OF WOUND  2017   Wound  Infection Post OP  . LYMPHADENECTOMY Bilateral 05/17/2014   Procedure: PELVIC LYMPHADENECTOMY;  Surgeon: Raynelle Bring, MD;  Location: [facility];  Service: Urology;  Laterality: Bilateral;  . PROSTATE BIOPSY  03/22/14   Raynelle Bring  . ROBOT ASSISTED LAPAROSCOPIC RADICAL PROSTATECTOMY N/A 05/17/2014   Procedure: ROBOTIC ASSISTED LAPAROSCOPIC RADICAL PROSTATECTOMY LEVEL 2;  Surgeon: Raynelle Bring, MD;  Location: [facility];  Service: Urology;  Laterality: N/A;  . SKIN TAG REMOVAL     15 lesions  . VASECTOMY    . VENTRAL HERNIA REPAIR N/A 03/27/2016   Procedure: LAPAROSCOPIC VENTRAL HERNIA;  Surgeon: Alphonsa Overall, MD;  Location: [facility];  Service: General;  Laterality: N/A;  WITH MESH    Current Medications: Current Meds  Medication Sig  . aspirin (HM ASPIRIN) 81 MG chewable tablet Chew 1 tablet (81 mg total) by mouth daily.  . Cholecalciferol (VITAMIN D) 2000 units tablet Take 2,000 Units by mouth daily.  . metoprolol succinate (TOPROL-XL) 25 MG 24 hr tablet TAKE 1/2 TABLET (12.5 MG TOTAL) DAILY WITH OR IMMEDIATELY FOLLOWING A MEAL.  . Multiple Vitamins-Minerals (MULTIVITAMIN WITH MINERALS) tablet Take 1 tablet by mouth daily.  . rosuvastatin (CRESTOR) 20 MG tablet TAKE 1 TABLET BY MOUTH EVERY DAY  . TRINTELLIX 20 MG TABS tablet Take  20 mg by mouth daily.  . valsartan-hydrochlorothiazide (DIOVAN-HCT) 320-25 MG per tablet Take 1 tablet by mouth every morning.      Allergies:   Amoxicillin-pot clavulanate and Topiramate   Social History   Socioeconomic History  . Marital status: Married    Spouse name: Not on file  . Number of children: 2  . Years of education: Not on file  . Highest education level: Not on file  Occupational History  . Occupation: Sales  Tobacco Use  . Smoking status: Never Smoker  . Smokeless tobacco: Never Used  Substance and Sexual Activity  . Alcohol use: No    Alcohol/week: 0.0 standard drinks  . Drug use: No  . Sexual activity: Not on file  Other Topics Concern   . Not on file  Social History Narrative  . Not on file   Social Determinants of Health   Financial Resource Strain: Not on file  Food Insecurity: Not on file  Transportation Needs: Not on file  Physical Activity: Not on file  Stress: Not on file  Social Connections: Not on file     Family History: The patient's family history includes COPD in his mother; Cancer in his mother; Heart attack in his mother and paternal grandfather; Heart failure in his mother; Ovarian cancer in his mother. ROS:   Please see the history of present illness.    All 14 point review of systems negative except as described per history of present illness  EKGs/Labs/Other Studies Reviewed:      Recent Labs: No results found for requested labs within last 8760 hours.  Recent Lipid Panel No results found for: CHOL, TRIG, HDL, CHOLHDL, VLDL, LDLCALC, LDLDIRECT  Physical Exam:    VS:  BP (!) 148/78 (BP Location: Right Arm, Patient Position: Sitting)   Pulse 70   Ht 5\' 11"  (1.803 m)   Wt 292 lb (132.5 kg)   SpO2 98%   BMI 40.73 kg/m     Wt Readings from Last 3 Encounters:  05/29/20 292 lb (132.5 kg)  05/21/20 286 lb (129.7 kg)  11/24/19 277 lb (125.6 kg)     GEN:  Well nourished, well developed in no acute distress HEENT: Normal NECK: No JVD; No carotid bruits LYMPHATICS: No lymphadenopathy CARDIAC: RRR, systolic ejection murmur grade 2/6 best heard right upper portion of the sternum with radiation towards neck, S2 is still present, no rubs, no gallops RESPIRATORY:  Clear to auscultation without rales, wheezing or rhonchi  ABDOMEN: Soft, non-tender, non-distended MUSCULOSKELETAL:  No edema; No deformity  SKIN: Warm and dry LOWER EXTREMITIES: no swelling NEUROLOGIC:  Alert and oriented x 3 PSYCHIATRIC:  Normal affect   ASSESSMENT:    1. Coronary artery disease involving native coronary artery of native heart without angina pectoris   2. Palpitations   3. Nonrheumatic aortic valve  stenosis   4. Pure hypercholesterolemia    PLAN:    In order of problems listed above:  1. Palpitations/dizziness.  The EKG showed frequent ventricular ectopy.  I will ask him to have Chem-7 done as well as magnesium, ask him to increase dose of metoprolol succinate from 12.5-25.  I explained to him the mechanism of his symptoms and what can happen when you increase dose of metoprolol which means it may suppress his extrasystole and he feels better however it may not sufficiently suppress extrasystole and make his bradycardia more pronounced which can make him feel worse.  And if that will be the case we may consider using  some more powerful antiarrhythmic therapy.  I will also schedule him to have an echocardiogram to assess left ventricle ejection fraction more importantly to check degree of aortic stenosis however I do not think it is critically significantly narrowed.  He will also wear Zio patch 2. Coronary disease stable denies having any symptoms that would suggest activation of coronary artery disease.  The key is risk factors modifications.  He is on antiplatelet therapy which I will continue, he is also taking statin which I will continue K PN review LDL of 79 HDL 41   Medication Adjustments/Labs and Tests Ordered: Current medicines are reviewed at length with the patient today.  Concerns regarding medicines are outlined above.  No orders of the defined types were placed in this encounter.  Medication changes: No orders of the defined types were placed in this encounter.   Signed, Park Liter, MD, [facility] 05/29/2020 3:02 PM    [facility]

## 2020-05-29 NOTE — Patient Instructions (Signed)
Medication Instructions:  Your physician has recommended you make the following change in your medication:  INCREASE: Metoprolol succinate (Toprol - XL) 25 mg daily *If you need a refill on your cardiac medications before your next appointment, please call your pharmacy*   Lab Work: Your physician recommends that you return for lab work: TODAY: BMP, Mag, TSH If you have labs (blood work) drawn today and your tests are completely normal, you will receive your results only by: Marland Kitchen MyChart Message (if you have MyChart) OR . A paper copy in the mail If you have any lab test that is abnormal or we need to change your treatment, we will call you to review the results.   Testing/Procedures: Your physician has requested that you have an echocardiogram. Echocardiography is a painless test that uses sound waves to create images of your heart. It provides your doctor with information about the size and shape of your heart and how well your heart's chambers and valves are working. This procedure takes approximately one hour. There are no restrictions for this procedure.  A zio monitor was ordered today. It will remain on for 7 days. You will then return monitor and event diary in provided box. It takes 1-2 weeks for report to be downloaded and returned to [place]. We will call you with the results. If monitor falls off or has orange flashing light, please call Zio for further instructions.      Follow-Up: At [facility], you and your health needs are our priority.  As part of our continuing mission to provide you with exceptional heart care, we have created designated Provider Care Teams.  These Care Teams include your primary Cardiologist (physician) and Advanced Practice Providers (APPs -  Physician Assistants and Nurse Practitioners) who all work together to provide you with the care you need, when you need it.  We recommend signing up for the patient portal called "MyChart".  Sign up information is  provided on this After Visit Summary.  MyChart is used to connect with patients for Virtual Visits (Telemedicine).  Patients are able to view lab/test results, encounter notes, upcoming appointments, etc.  Non-urgent messages can be sent to your provider as well.   To learn more about what you can do with MyChart, go to [email or web address].    Your next appointment:   1 month(s)  The format for your next appointment:   In Person  Provider:   Jenne Campus, MD or Berniece Salines, DO   Other Instructions  Echocardiogram An echocardiogram is a test that uses sound waves (ultrasound) to produce images of the heart. Images from an echocardiogram can provide important information about:  Heart size and shape.  The size and thickness and movement of your heart's walls.  Heart muscle function and strength.  Heart valve function or if you have stenosis. Stenosis is when the heart valves are too narrow.  If blood is flowing backward through the heart valves (regurgitation).  A tumor or infectious growth around the heart valves.  Areas of heart muscle that are not working well because of poor blood flow or injury from a heart attack.  Aneurysm detection. An aneurysm is a weak or damaged part of an artery wall. The wall bulges out from the normal force of blood pumping through the body. Tell a health care provider about:  Any allergies you have.  All medicines you are taking, including vitamins, herbs, eye drops, creams, and over-the-counter medicines.  Any blood disorders you have.  Any  surgeries you have had.  Any medical conditions you have.  Whether you are pregnant or may be pregnant. What are the risks? Generally, this is a safe test. However, problems may occur, including an allergic reaction to dye (contrast) that may be used during the test. What happens before the test? No specific preparation is needed. You may eat and drink normally. What happens during the  test?  You will take off your clothes from the waist up and put on a hospital gown.  Electrodes or electrocardiogram (ECG)patches may be placed on your chest. The electrodes or patches are then connected to a device that monitors your heart rate and rhythm.  You will lie down on a table for an ultrasound exam. A gel will be applied to your chest to help sound waves pass through your skin.  A handheld device, called a transducer, will be pressed against your chest and moved over your heart. The transducer produces sound waves that travel to your heart and bounce back (or "echo" back) to the transducer. These sound waves will be captured in real-time and changed into images of your heart that can be viewed on a video monitor. The images will be recorded on a computer and reviewed by your health care provider.  You may be asked to change positions or hold your breath for a short time. This makes it easier to get different views or better views of your heart.  In some cases, you may receive contrast through an IV in one of your veins. This can improve the quality of the pictures from your heart. The procedure may vary among health care providers and hospitals.   What can I expect after the test? You may return to your normal, everyday life, including diet, activities, and medicines, unless your health care provider tells you not to do that. Follow these instructions at home:  It is up to you to get the results of your test. Ask your health care provider, or the department that is doing the test, when your results will be ready.  Keep all follow-up visits. This is important. Summary  An echocardiogram is a test that uses sound waves (ultrasound) to produce images of the heart.  Images from an echocardiogram can provide important information about the size and shape of your heart, heart muscle function, heart valve function, and other possible heart problems.  You do not need to do anything to  prepare before this test. You may eat and drink normally.  After the echocardiogram is completed, you may return to your normal, everyday life, unless your health care provider tells you not to do that. This information is not intended to replace advice given to you by your health care provider. Make sure you discuss any questions you have with your health care provider. Document Revised: 10/24/2019 Document Reviewed: 10/24/2019 Elsevier Patient Education  2021 [employer].

## 2020-05-30 LAB — BASIC METABOLIC PANEL
BUN/Creatinine Ratio: 18 (ref 10–24)
BUN: 18 mg/dL (ref 8–27)
CO2: 25 mmol/L (ref 20–29)
Calcium: 9.9 mg/dL (ref 8.6–10.2)
Chloride: 101 mmol/L (ref 96–106)
Creatinine, Ser: 1 mg/dL (ref 0.76–1.27)
Glucose: 108 mg/dL — ABNORMAL HIGH (ref 65–99)
Potassium: 4.2 mmol/L (ref 3.5–5.2)
Sodium: 140 mmol/L (ref 134–144)
eGFR: 84 mL/min/{1.73_m2} (ref >59)

## 2020-05-30 LAB — TSH: TSH: 1.76 u[IU]/mL (ref 0.450–4.500)

## 2020-05-30 LAB — MAGNESIUM: Magnesium: 2.2 mg/dL (ref 1.6–2.3)

## 2020-06-06 DIAGNOSIS — F331 Major depressive disorder, recurrent, moderate: Secondary | ICD-10-CM | POA: Diagnosis not present

## 2020-06-11 DIAGNOSIS — I350 Nonrheumatic aortic (valve) stenosis: Secondary | ICD-10-CM | POA: Diagnosis not present

## 2020-06-11 DIAGNOSIS — R002 Palpitations: Secondary | ICD-10-CM | POA: Diagnosis not present

## 2020-06-11 DIAGNOSIS — E7800 Pure hypercholesterolemia, unspecified: Secondary | ICD-10-CM | POA: Diagnosis not present

## 2020-06-11 DIAGNOSIS — I2510 Atherosclerotic heart disease of native coronary artery without angina pectoris: Secondary | ICD-10-CM | POA: Diagnosis not present

## 2020-06-14 ENCOUNTER — Telehealth: Payer: Self-pay | Admitting: Emergency Medicine

## 2020-06-14 MED ORDER — METOPROLOL SUCCINATE ER 50 MG PO TB24: 50.0000 mg | ORAL_TABLET | Freq: Every day | ORAL | 1 refills | Status: DC

## 2020-06-14 NOTE — Telephone Encounter (Signed)
-----   Message from Park Liter, MD sent at 06/14/2020 12:17 PM EDT ----- There was one episode of ventricular tachycardia.  However he is coronary CT angio was negative, he is echocardiogram preserved ejection fraction.  Please increase dose of metoprolol to 50 mg daily

## 2020-06-24 DIAGNOSIS — I10 Essential (primary) hypertension: Secondary | ICD-10-CM | POA: Insufficient documentation

## 2020-06-24 DIAGNOSIS — E785 Hyperlipidemia, unspecified: Secondary | ICD-10-CM | POA: Insufficient documentation

## 2020-06-26 ENCOUNTER — Encounter: Payer: Self-pay | Admitting: Cardiology

## 2020-06-26 ENCOUNTER — Other Ambulatory Visit: Payer: Self-pay

## 2020-06-26 ENCOUNTER — Ambulatory Visit (HOSPITAL_BASED_OUTPATIENT_CLINIC_OR_DEPARTMENT_OTHER): Payer: BC Managed Care – PPO

## 2020-06-26 ENCOUNTER — Ambulatory Visit: Payer: BC Managed Care – PPO | Admitting: Cardiology

## 2020-06-26 VITALS — BP 118/80 | HR 62 | Ht 71.0 in | Wt 293.0 lb

## 2020-06-26 DIAGNOSIS — I2510 Atherosclerotic heart disease of native coronary artery without angina pectoris: Secondary | ICD-10-CM

## 2020-06-26 DIAGNOSIS — R5383 Other fatigue: Secondary | ICD-10-CM

## 2020-06-26 DIAGNOSIS — I350 Nonrheumatic aortic (valve) stenosis: Secondary | ICD-10-CM | POA: Diagnosis not present

## 2020-06-26 DIAGNOSIS — I10 Essential (primary) hypertension: Secondary | ICD-10-CM

## 2020-06-26 DIAGNOSIS — I493 Ventricular premature depolarization: Secondary | ICD-10-CM

## 2020-06-26 DIAGNOSIS — E669 Obesity, unspecified: Secondary | ICD-10-CM

## 2020-06-26 MED ORDER — DILTIAZEM HCL ER COATED BEADS 120 MG PO CP24: 120.0000 mg | ORAL_CAPSULE | Freq: Every day | ORAL | 3 refills | Status: DC

## 2020-06-26 NOTE — Progress Notes (Signed)
Cardiology Office Note:    Date:  06/26/2020   ID:  [name], DOB 1956-02-18, MRN [id number]  PCP:  Josetta Huddle, MD  Cardiologist:  Berniece Salines, DO  Electrophysiologist:  None   Referring MD: Josetta Huddle, MD   " Please take me off the beta blocker"  History of Present Illness:    Thomas [name] is a 65 y.o. male with a hx of  hx ofcoronary artery disease-hypertension, hyperlipidemia, obesity, mild to moderate aortic stenosis, presents today for follow-up. During his last visit he was status post coronary CTA at that time we discussed the results from his CTA and his echo.   I saw the patient in March 2021 at that time he appeared to be doing well from a cardiovascular standpoint.  He is here today for follow-up visit.  He still appears to be happy.  He still living at home.  He offers no complaints at this time.  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  I saw the patient on May 21, 2020 at that time he appeared to be doing well from a cardiovascular standpoint.  We continued the patient on his metoprolol 12.5 mg twice daily as well as his valsartan hydrochlorothiazide.  And he was good for a year follow-up.   In the interim the patient was seen by my partner Dr. Krasowski after he presented to medicine [place] due to the fact that he was told his heart was skipping a beat.  During that visit a Ziehl monitor was placed on the patient given his EKG showed frequent premature ventricular ectopy which was new compared to his EKG which was done in September 2020.  His metoprolol was increased to 25 mg daily.  The patient tells me since starting on the 25 mg of the metoprolol he has felt significant fatigue.  He did get a call from our office letting him know that his monitor showed frequent PVCs to increase the beta-blocker to 50 mg daily.  But he was not able to do days given the fact that 25 was making significantly tired.  Past Medical History:  Diagnosis Date  . Anemia    hx  of in 1992   . Anxiety   . Arthritis    lower back   . GERD (gastroesophageal reflux disease)    rarely   . History of anxiety   . Hyperlipemia   . Hypertension   . Lower urinary tract symptoms (LUTS)   . Malignant neoplasm of prostate (HCC) 04/17/2014  . Pneumonia    hx x2 of pneumonia at age 43   . 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 Ventral hernia without obstruction or gangrene 03/27/2016    Past Surgical History:  Procedure Laterality Date  . Epididymis Excision Left    Spermocele  . HEMORROIDECTOMY    . HERNIA REPAIR Bilateral 1967 and 1986   x2 open inguinal hernia repairs  . INCISION AND DRAINAGE OF WOUND  2017   Wound Infection Post OP  . LYMPHADENECTOMY Bilateral 05/17/2014   Procedure: PELVIC LYMPHADENECTOMY;  Surgeon: Lester Borden, MD;  Location: [facility];  Service: Urology;  Laterality: Bilateral;  . PROSTATE BIOPSY  03/22/14   Lester Borden  . ROBOT ASSISTED LAPAROSCOPIC RADICAL PROSTATECTOMY N/A 05/17/2014   Procedure: ROBOTIC ASSISTED LAPAROSCOPIC RADICAL PROSTATECTOMY LEVEL 2;  Surgeon: Lester Borden, MD;  Location: [facility];  Service: Urology;  Laterality: N/A;  . SKIN TAG REMOVAL     15  lesions  .  VASECTOMY    . VENTRAL HERNIA REPAIR N/A 03/27/2016   Procedure: LAPAROSCOPIC VENTRAL HERNIA;  Surgeon: Alphonsa Overall, MD;  Location: [facility];  Service: General;  Laterality: N/A;  WITH MESH    Current Medications: Current Meds  Medication Sig  . aspirin (HM ASPIRIN) 81 MG chewable tablet Chew 1 tablet (81 mg total) by mouth daily.  . Cholecalciferol (VITAMIN D) 2000 units tablet Take 2,000 Units by mouth daily.  Marland Kitchen diltiazem (CARDIZEM CD) 120 MG 24 hr capsule Take 1 capsule (120 mg total) by mouth daily.  . Multiple Vitamins-Minerals (MULTIVITAMIN WITH MINERALS) tablet Take 1 tablet by mouth daily.  . rosuvastatin (CRESTOR) 20 MG tablet TAKE 1 TABLET BY MOUTH EVERY DAY  . TRINTELLIX 20 MG TABS tablet Take 20 mg by mouth daily.  .  valsartan-hydrochlorothiazide (DIOVAN-HCT) 320-25 MG per tablet Take 1 tablet by mouth every morning.   . [DISCONTINUED] metoprolol succinate (TOPROL XL) 50 MG 24 hr tablet Take 1 tablet (50 mg total) by mouth daily.     Allergies:   Amoxicillin-pot clavulanate and Topiramate   Social History   Socioeconomic History  . Marital status: Married    Spouse name: Not on file  . Number of children: 2  . Years of education: Not on file  . Highest education level: Not on file  Occupational History  . Occupation: Sales  Tobacco Use  . Smoking status: Never Smoker  . Smokeless tobacco: Never Used  Substance and Sexual Activity  . Alcohol use: No    Alcohol/week: 0.0 standard drinks  . Drug use: No  . Sexual activity: Not on file  Other Topics Concern  . Not on file  Social History Narrative  . Not on file   Social Determinants of Health   Financial Resource Strain: Not on file  Food Insecurity: Not on file  Transportation Needs: Not on file  Physical Activity: Not on file  Stress: Not on file  Social Connections: Not on file     Family History: The patient's family history includes COPD in his mother; Cancer in his mother; Heart attack in his mother and paternal grandfather; Heart failure in his mother; Ovarian cancer in his mother.  ROS:   Review of Systems  Constitution: Negative for decreased appetite, fever and weight gain.  HENT: Negative for congestion, ear discharge, hoarse voice and sore throat.   Eyes: Negative for discharge, redness, vision loss in right eye and visual halos.  Cardiovascular: Negative for chest pain, dyspnea on exertion, leg swelling, orthopnea and palpitations.  Respiratory: Negative for cough, hemoptysis, shortness of breath and snoring.   Endocrine: Negative for heat intolerance and polyphagia.  Hematologic/Lymphatic: Negative for bleeding problem. Does not bruise/bleed easily.  Skin: Negative for flushing, nail changes, rash and suspicious  lesions.  Musculoskeletal: Negative for arthritis, joint pain, muscle cramps, myalgias, neck pain and stiffness.  Gastrointestinal: Negative for abdominal pain, bowel incontinence, diarrhea and excessive appetite.  Genitourinary: Negative for decreased libido, genital sores and incomplete emptying.  Neurological: Negative for brief paralysis, focal weakness, headaches and loss of balance.  Psychiatric/Behavioral: Negative for altered mental status, depression and suicidal ideas.  Allergic/Immunologic: Negative for HIV exposure and persistent infections.    EKGs/Labs/Other Studies Reviewed:    The following studies were reviewed today:   EKG:  The ekg ordered today demonstrates   ZIO monitor Patch Wear Time:  5 days and 21 hours (2022-03-16T15:25:37-0400 to 2022-03-22T12:34:48-0400)  Patient had a min HR of 42 bpm, max HR of 167  bpm, and avg HR of 65 bpm. Predominant underlying rhythm was Sinus Rhythm. Intermittent Bundle Branch Block was present. QRS morphology changes were present due to Intermittent Bundle Branch Block. 1 run of  Ventricular Tachycardia occurred lasting 4 beats with a max rate of 167 bpm (avg 145 bpm). Isolated SVEs were rare (<1.0%), SVE Couplets were rare (<1.0%), and SVE Triplets were rare (<1.0%). Isolated VEs were frequent (6.3%, [id number]), VE Couplets were  rare (<1.0%, 173), and no VE Triplets were present. Ventricular Trigeminy was present.   Summary and conclusions: 1 episode of ventricular tachycardia total of 4 beats at rate of 167 Ventricular ectopy with total burden of 6.3%. 1 triggered event for PVCs   Recent Labs: 05/29/2020: BUN 18; Creatinine, Ser 1.00; Magnesium 2.2; Potassium 4.2; Sodium 140; TSH 1.760  Recent Lipid Panel No results found for: CHOL, TRIG, HDL, CHOLHDL, VLDL, LDLCALC, LDLDIRECT  Physical Exam:    VS:  BP 118/80   Pulse 62   Ht 5\' 11"  (1.803 m)   Wt 293 lb (132.9 kg)   SpO2 98%   BMI 40.87 kg/m     Wt Readings from Last  3 Encounters:  06/26/20 293 lb (132.9 kg)  05/29/20 292 lb (132.5 kg)  05/21/20 286 lb (129.7 kg)     GEN: Well nourished, well developed in no acute distress HEENT: Normal NECK: No JVD; No carotid bruits LYMPHATICS: No lymphadenopathy CARDIAC: S1S2 noted,RRR, 3/6 systolic murmurs, rubs, gallops RESPIRATORY:  Clear to auscultation without rales, wheezing or rhonchi  ABDOMEN: Soft, non-tender, non-distended, +bowel sounds, no guarding. EXTREMITIES: No edema, No cyanosis, no clubbing MUSCULOSKELETAL:  No deformity  SKIN: Warm and dry NEUROLOGIC:  Alert and oriented x 3, non-focal PSYCHIATRIC:  Normal affect, good insight  ASSESSMENT:    1. Frequent PVCs   2. Other fatigue   3. Nonrheumatic aortic valve stenosis   4. Essential hypertension   5. Obesity (BMI 30-39.9)   6. Coronary artery disease involving native coronary artery of native heart without angina pectoris    PLAN:     His ZIO monitor recently placed on him showed frequent PVCs.  He is having fatigue with the higher dose of metoprolol succinate.  Given stop his medication started patient on Cardizem 120 mg daily.  Hoping this will help with decreasing his PVC burden.  He really denies any symptoms of lightheadedness dizziness or palpitations.  No anginal symptoms.  The patient understands the need to lose weight with diet and exercise. We have discussed specific strategies for this.  The patient is in agreement with the above plan. The patient left the office in stable condition.  The patient will follow up in   Medication Adjustments/Labs and Tests Ordered: Current medicines are reviewed at length with the patient today.  Concerns regarding medicines are outlined above.  No orders of the defined types were placed in this encounter.  Meds ordered this encounter  Medications  . diltiazem (CARDIZEM CD) 120 MG 24 hr capsule    Sig: Take 1 capsule (120 mg total) by mouth daily.    Dispense:  90 capsule    Refill:   3    Patient Instructions  Medication Instructions:  Your physician has recommended you make the following change in your medication: STOP: Metoprolol START:Diltizam (Cardizem) 120 mg once daily *If you need a refill on your cardiac medications before your next appointment, please call your pharmacy*   Lab Work: None If you have labs (blood work) drawn today and your tests  are completely normal, you will receive your results only by: Marland Kitchen MyChart Message (if you have MyChart) OR . A paper copy in the mail If you have any lab test that is abnormal or we need to change your treatment, we will call you to review the results.   Testing/Procedures: None   Follow-Up: At [facility], you and your health needs are our priority.  As part of our continuing mission to provide you with exceptional heart care, we have created designated Provider Care Teams.  These Care Teams include your primary Cardiologist (physician) and Advanced Practice Providers (APPs -  Physician Assistants and Nurse Practitioners) who all work together to provide you with the care you need, when you need it.  We recommend signing up for the patient portal called "MyChart".  Sign up information is provided on this After Visit Summary.  MyChart is used to connect with patients for Virtual Visits (Telemedicine).  Patients are able to view lab/test results, encounter notes, upcoming appointments, etc.  Non-urgent messages can be sent to your provider as well.   To learn more about what you can do with MyChart, go to [email or web address].    Your next appointment:   8 week(s)  The format for your next appointment:   In Person  Provider:   Berniece Salines, DO   Other Instructions      Adopting a Healthy Lifestyle.  Know what a healthy weight is for you (roughly BMI <25) and aim to maintain this   Aim for 7+ servings of fruits and vegetables daily   65-80+ fluid ounces of water or unsweet tea for healthy  kidneys   Limit to max 1 drink of alcohol per day; avoid smoking/tobacco   Limit animal fats in diet for cholesterol and heart health - choose grass fed whenever available   Avoid highly processed foods, and foods high in saturated/trans fats   Aim for low stress - take time to unwind and care for your mental health   Aim for 150 min of moderate intensity exercise weekly for heart health, and weights twice weekly for bone health   Aim for 7-9 hours of sleep daily   When it comes to diets, agreement about the perfect plan isnt easy to find, even among the experts. Experts at the [employer] developed an idea known as the Healthy Eating Plate. Just imagine a plate divided into logical, healthy portions.   The emphasis is on diet quality:   Load up on vegetables and fruits - one-half of your plate: Aim for color and variety, and remember that potatoes dont count.   Go for whole grains - one-quarter of your plate: Whole wheat, barley, wheat berries, quinoa, oats, brown rice, and foods made with them. If you want pasta, go with whole wheat pasta.   Protein power - one-quarter of your plate: Fish, chicken, beans, and nuts are all healthy, versatile protein sources. Limit red meat.   The diet, however, does go beyond the plate, offering a few other suggestions.   Use healthy plant oils, such as olive, canola, soy, corn, sunflower and peanut. Check the labels, and avoid partially hydrogenated oil, which have unhealthy trans fats.   If youre thirsty, drink water. Coffee and tea are good in moderation, but skip sugary drinks and limit milk and dairy products to one or two daily servings.   The type of carbohydrate in the diet is more important than the amount. Some sources of carbohydrates, such as  vegetables, fruits, whole grains, and beans-are healthier than others.   Finally, stay active  Signed, Berniece Salines, DO  06/26/2020 5:35 PM    [facility]  [facility]

## 2020-06-26 NOTE — Patient Instructions (Addendum)
Medication Instructions:  Your physician has recommended you make the following change in your medication: STOP: Metoprolol START:Diltizam (Cardizem) 120 mg once daily *If you need a refill on your cardiac medications before your next appointment, please call your pharmacy*   Lab Work: None If you have labs (blood work) drawn today and your tests are completely normal, you will receive your results only by: Marland Kitchen MyChart Message (if you have MyChart) OR . A paper copy in the mail If you have any lab test that is abnormal or we need to change your treatment, we will call you to review the results.   Testing/Procedures: None   Follow-Up: At [facility], you and your health needs are our priority.  As part of our continuing mission to provide you with exceptional heart care, we have created designated Provider Care Teams.  These Care Teams include your primary Cardiologist (physician) and Advanced Practice Providers (APPs -  Physician Assistants and Nurse Practitioners) who all work together to provide you with the care you need, when you need it.  We recommend signing up for the patient portal called "MyChart".  Sign up information is provided on this After Visit Summary.  MyChart is used to connect with patients for Virtual Visits (Telemedicine).  Patients are able to view lab/test results, encounter notes, upcoming appointments, etc.  Non-urgent messages can be sent to your provider as well.   To learn more about what you can do with MyChart, go to [email or web address].    Your next appointment:   8 week(s)  The format for your next appointment:   In Person  Provider:   Berniece Salines, DO   Other Instructions

## 2020-07-02 ENCOUNTER — Telehealth: Payer: Self-pay | Admitting: Cardiology

## 2020-07-02 NOTE — Telephone Encounter (Signed)
Pt c/o medication issue:  1. Name of Medication: diltiazem (CARDIZEM CD) 120 MG 24 hr capsule  2. How are you currently taking this medication (dosage and times per day)? Take 1 capsule (120 mg total) by mouth daily.  3. Are you having a reaction (difficulty breathing--STAT)? No  4. What is your medication issue? Since pt has been taking this med, he feels anxious since 06/30/20 Pt states he takes this med in the morning with food

## 2020-07-02 NOTE — Telephone Encounter (Signed)
Please let him know that I would like him to stop that medication since it is making him anxious. Let him start Acebutolol 200 mg twice a day.

## 2020-07-03 NOTE — Telephone Encounter (Signed)
Left message for patient to return our call.

## 2020-07-03 NOTE — Telephone Encounter (Signed)
Spoke to the patient just now and he let me know that his anxiety symptoms have gotten much better since he initially called in and he is willing to keep taking this medication at this time. I advised that this was fine and he could call us back if he had any other issues or further concerns.

## 2020-07-03 NOTE — Telephone Encounter (Signed)
Patient returning call.

## 2020-07-03 NOTE — Telephone Encounter (Signed)
Thomas [name] is returning your call from yesterday.

## 2020-07-04 DIAGNOSIS — F331 Major depressive disorder, recurrent, moderate: Secondary | ICD-10-CM | POA: Diagnosis not present

## 2020-07-16 ENCOUNTER — Telehealth: Payer: Self-pay | Admitting: Cardiology

## 2020-07-16 NOTE — Telephone Encounter (Signed)
Pt c/o medication issue:  1. Name of Medication: diltiazem (CARDIZEM CD) 120 MG 24 hr capsule  2. How are you currently taking this medication (dosage and times per day)? 1 tablet daily  3. Are you having a reaction (difficulty breathing--STAT)? no  4. What is your medication issue? Patient states the medication makes him extremely tired. He states a different medication was suggested, but it was decided to wait. He states he left work one day and went to bed at 7 pm and got up at 6 am. He states he never used to do this before.

## 2020-07-16 NOTE — Telephone Encounter (Signed)
okay please ask him to stop the Cardizem.  Have him start acebutolol 200 mg twice a day.  I would also like to refer him to EP for consideration of antiarrhythmics.

## 2020-07-22 ENCOUNTER — Other Ambulatory Visit: Payer: Self-pay

## 2020-07-22 ENCOUNTER — Telehealth: Payer: Self-pay | Admitting: Cardiology

## 2020-07-22 DIAGNOSIS — I493 Ventricular premature depolarization: Secondary | ICD-10-CM

## 2020-07-22 MED ORDER — ACEBUTOLOL HCL 200 MG PO CAPS: 200.0000 mg | ORAL_CAPSULE | Freq: Two times a day (BID) | ORAL | 3 refills | Status: DC

## 2020-07-22 NOTE — Progress Notes (Signed)
Prescription sent to pharmacy. Referral made per Dr. Terrial Rhodes orders

## 2020-07-22 NOTE — Telephone Encounter (Signed)
Left message for patient to return our call.

## 2020-07-22 NOTE — Telephone Encounter (Signed)
Prescription sent to pharmacy. Referral made, see chart.

## 2020-07-22 NOTE — Telephone Encounter (Signed)
Patient presented to [place].I recieved the call from our staff. I was able to speak to the patient. He was frustrated because our phone system takes long for answering. He had questions about his medication. He is fatigued on cardizem- has been on cardizem for frequent PVCs.   I advised the patient stop Cardizem.  We will try acebutolol 200mg  twice a day. I will also send the patient to EP for consideration for start of antiarrhythmic vs evaluation for PVC ablation.   He is agreeable with the plan.

## 2020-08-01 ENCOUNTER — Other Ambulatory Visit: Payer: Self-pay

## 2020-08-01 ENCOUNTER — Encounter: Payer: Self-pay | Admitting: Cardiology

## 2020-08-01 ENCOUNTER — Ambulatory Visit: Payer: BC Managed Care – PPO | Admitting: Cardiology

## 2020-08-01 VITALS — BP 122/86 | HR 66 | Ht 71.0 in | Wt 285.0 lb

## 2020-08-01 DIAGNOSIS — I493 Ventricular premature depolarization: Secondary | ICD-10-CM | POA: Diagnosis not present

## 2020-08-01 NOTE — Progress Notes (Signed)
Electrophysiology Office Note   Date:  08/01/2020   ID:  Thomas [name], DOB January 04, 1956, MRN [id number]  PCP:  Josetta Huddle, MD  Cardiologist:  Tobb Primary Electrophysiologist:  Lissa Rowles Meredith Leeds, MD    Chief Complaint: PVCs   History of Present Illness: Thomas [name] is a 65 y.o. male who is being seen today for the evaluation of PVCs at the request of Tobb, Kardie, DO. Presenting today for electrophysiology evaluation.  He has a history significant for coronary artery disease, hypertension, hyperlipidemia, obesity, moderate aortic stenosis.  He presented to [facility] as he was told that he had a skipped heartbeat.  He wore a ZIO monitor that showed a 6% PVC burden.  His metoprolol dose was increased.  He has been having significant fatigue on the metoprolol.  He was switched to Cardizem, but has continued to have episodes of fatigue.  Today, he denies symptoms of palpitations, chest pain, shortness of breath, orthopnea, PND, lower extremity edema, claudication, dizziness, presyncope, syncope, bleeding, or neurologic sequela. The patient is tolerating medications without difficulties.  He is currently feeling well.  He has no chest pain, shortness of breath, or palpitations.  He is able to do all of his daily activities.  He does have some fatigue, though he has been fatigued for quite some time.  He is unclear whether or not his PVCs are causing his fatigue.  He had significant issues on both beta-blockers and calcium channel blockers with fatigue.   Past Medical History:  Diagnosis Date  . Anemia    hx of in 1992   . Anxiety   . Arthritis    lower back   . GERD (gastroesophageal reflux disease)    rarely   . History of anxiety   . Hyperlipemia   . Hypertension   . Lower urinary tract symptoms (LUTS)   . Malignant neoplasm of prostate ([place]) 04/17/2014  . Pneumonia    hx x2 of pneumonia at age 55   . 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 Ventral hernia  without obstruction or gangrene 03/27/2016   Past Surgical History:  Procedure Laterality Date  . Epididymis Excision Left    Spermocele  . HEMORROIDECTOMY    . HERNIA REPAIR Bilateral 1967 and 1986   x2 open inguinal hernia repairs  . INCISION AND DRAINAGE OF WOUND  2017   Wound Infection Post OP  . LYMPHADENECTOMY Bilateral 05/17/2014   Procedure: PELVIC LYMPHADENECTOMY;  Surgeon: Raynelle Bring, MD;  Location: [facility];  Service: Urology;  Laterality: Bilateral;  . PROSTATE BIOPSY  03/22/14   Raynelle Bring  . ROBOT ASSISTED LAPAROSCOPIC RADICAL PROSTATECTOMY N/A 05/17/2014   Procedure: ROBOTIC ASSISTED LAPAROSCOPIC RADICAL PROSTATECTOMY LEVEL 2;  Surgeon: Raynelle Bring, MD;  Location: [facility];  Service: Urology;  Laterality: N/A;  . SKIN TAG REMOVAL     15 lesions  . VASECTOMY    . VENTRAL HERNIA REPAIR N/A 03/27/2016   Procedure: LAPAROSCOPIC VENTRAL HERNIA;  Surgeon: Alphonsa Overall, MD;  Location: [facility];  Service: General;  Laterality: N/A;  WITH MESH     Current Outpatient Medications  Medication Sig Dispense Refill  . aspirin EC 81 MG tablet Take 81 mg by mouth daily. Swallow whole.    . Cholecalciferol (VITAMIN D) 2000 units tablet Take 2,000 Units by mouth daily.    . Multiple Vitamins-Minerals (MULTIVITAMIN WITH MINERALS) tablet Take 1 tablet by mouth daily.    . rosuvastatin (CRESTOR) 20 MG tablet TAKE 1  TABLET BY MOUTH EVERY DAY 90 tablet 3  . TRINTELLIX 20 MG TABS tablet Take 20 mg by mouth daily.    . valsartan-hydrochlorothiazide (DIOVAN-HCT) 320-25 MG per tablet Take 1 tablet by mouth every morning.   12   No current facility-administered medications for this visit.    Allergies:   Amoxicillin-pot clavulanate and Topiramate   Social History:  The patient  reports that he has never smoked. He has never used smokeless tobacco. He reports that he does not drink alcohol and does not use drugs.   Family History:  The patient's family history includes COPD in his mother; Cancer  in his mother; Heart attack in his mother and paternal grandfather; Heart failure in his mother; Ovarian cancer in his mother.    ROS:  Please see the history of present illness.   Otherwise, review of systems is positive for none.   All other systems are reviewed and negative.    PHYSICAL EXAM: VS:  BP 122/86   Pulse 66   Ht 5\' 11"  (1.803 m)   Wt 285 lb (129.3 kg)   SpO2 98%   BMI 39.75 kg/m  , BMI Body mass index is 39.75 kg/m. GEN: Well nourished, well developed, in no acute distress  HEENT: normal  Neck: no JVD, carotid bruits, or masses Cardiac: RRR; no murmurs, rubs, or gallops,no edema  Respiratory:  clear to auscultation bilaterally, normal work of breathing GI: soft, nontender, nondistended, + BS MS: no deformity or atrophy  Skin: warm and dry Neuro:  Strength and sensation are intact Psych: euthymic mood, full affect  EKG:  EKG is ordered today. Personal review of the ekg ordered shows sinus rhythm, rate 66  Recent Labs: 05/29/2020: BUN 18; Creatinine, Ser 1.00; Magnesium 2.2; Potassium 4.2; Sodium 140; TSH 1.760    Lipid Panel  No results found for: CHOL, TRIG, HDL, CHOLHDL, VLDL, LDLCALC, LDLDIRECT   Wt Readings from Last 3 Encounters:  08/01/20 285 lb (129.3 kg)  06/26/20 293 lb (132.9 kg)  05/29/20 292 lb (132.5 kg)      Other studies Reviewed: Additional studies/ records that were reviewed today include: Cardiac monitor 06/14/2020 personally reviewed Review of the above records today demonstrates:  1 episode of ventricular tachycardia total of 4 beats at rate of 167 Ventricular ectopy with total burden of 6.3%. 1 triggered event for PVCs  TTE 2020 1. Left ventricular ejection fraction, by visual estimation, is 55 to  60%. The left ventricle has normal function. Left ventricular septal wall  thickness was moderately increased. Moderately increased left ventricular  posterior wall thickness. There is  moderately increased left ventricular  hypertrophy.  2. Left ventricular diastolic parameters are consistent with Grade I  diastolic dysfunction (impaired relaxation).  3. Global right ventricle has normal systolic function.The right  ventricular size is normal. No increase in right ventricular wall  thickness.  4. Left atrial size was normal.  5. Right atrial size was normal.  6. The mitral valve is normal in structure. No evidence of mitral valve  regurgitation. No evidence of mitral stenosis.  7. The tricuspid valve is normal in structure. Tricuspid valve  regurgitation is trivial.  8. The aortic valve is mildly thickened with mild calcification. There is  mild-moderate aortic valve stenosis. AVA by VTI 1.10 cmsq, Mean gradient  18 mmHg, Peak velocity 2.8 m/s. Aortic valve regurgitation is not  visualized.  9. The pulmonic valve was not well visualized. Pulmonic valve  regurgitation is not visualized.  10. Normal  pulmonary artery systolic pressure.   Coronary CTA 03/21/2020 1. No left main artery. 2. LAD: Mid 0.87 3. LCX: Mid: 0.87, Distal: 0.77 4. RCA: 0.97  1. Coronary calcium score of 113. This was 78 percentile for age and sex matched control. 2. No Left main artery present. LAD and LCX with two separate origin. Left Dominance. 3. Non-obstructive Coronary artery disease with the most significant lesion being moderate (50-69%). CAD-RADS 3. This study Fallyn Munnerlyn be sent for FFR.  ASSESSMENT AND PLAN:  1.  PVCs: Patient found to have a 6.3% burden on most recent cardiac monitor.  He has not tolerated Cardizem or metoprolol due to fatigue.  He is fortunately not having PVCs today.  His PVC burden is quite low.  I do not think that he Lyndzie Zentz have any change in his ejection fraction due to his PVCs.  I am also not convinced that his PVCs are causing him symptoms.  He has quite a few reasons to feel fatigued including obesity and possible obstructive sleep apnea.  As he is very symptomatic on medications, we Kahliya Fraleigh stop his  beta-blocker and calcium channel blockers.  If his ejection fraction is normal on his echo, we [place] make no further changes and continue to monitor.  2.  Hypertension: Currently well controlled  3.  Coronary artery disease: Minimal disease on coronary CTA no current chest pain.  Plan per primary cardiology.  Case discussed with primary cardiology  Current medicines are reviewed at length with the patient today.   The patient does not have concerns regarding his medicines.  The following changes were made today:  none  Labs/ tests ordered today include:  Orders Placed This Encounter  Procedures  . EKG 12-Lead     Disposition:   FU with Pruitt Taboada As needed  Signed, Cullin Dishman Meredith Leeds, MD  08/01/2020 3:07 PM     [place] [address] [place] [address] [phone] (office) [phone] (fax)

## 2020-08-01 NOTE — Patient Instructions (Signed)
Medication Instructions:  ° °*If you need a refill on your cardiac medications before your next appointment, please call your pharmacy* ° ° °Lab Work: ° °If you have labs (blood work) drawn today and your tests are completely normal, you will receive your results only by: °MyChart Message (if you have MyChart) OR °A paper copy in the mail °If you have any lab test that is abnormal or we need to change your treatment, we will call you to review the results. ° ° °Testing/Procedures: ° ° ° °Follow-Up: °At [facility], you and your health needs are our priority.  As part of our continuing mission to provide you with exceptional heart care, we have created designated Provider Care Teams.  These Care Teams include your primary Cardiologist (physician) and Advanced Practice Providers (APPs -  Physician Assistants and Nurse Practitioners) who all work together to provide you with the care you need, when you need it. ° °We recommend signing up for the patient portal called "MyChart".  Sign up information is provided on this After Visit Summary.  MyChart is used to connect with patients for Virtual Visits (Telemedicine).  Patients are able to view lab/test results, encounter notes, upcoming appointments, etc.  Non-urgent messages can be sent to your provider as well.   °To learn more about what you can do with MyChart, go to [email or web address].   °

## 2020-08-08 DIAGNOSIS — F331 Major depressive disorder, recurrent, moderate: Secondary | ICD-10-CM | POA: Diagnosis not present

## 2020-08-17 ENCOUNTER — Other Ambulatory Visit: Payer: Self-pay | Admitting: Cardiology

## 2020-08-26 ENCOUNTER — Ambulatory Visit: Payer: BC Managed Care – PPO | Admitting: Cardiology

## 2020-08-26 ENCOUNTER — Other Ambulatory Visit: Payer: Self-pay

## 2020-08-26 ENCOUNTER — Encounter: Payer: Self-pay | Admitting: Cardiology

## 2020-08-26 VITALS — BP 130/88 | HR 72 | Ht 71.0 in | Wt 293.0 lb

## 2020-08-26 DIAGNOSIS — I2510 Atherosclerotic heart disease of native coronary artery without angina pectoris: Secondary | ICD-10-CM

## 2020-08-26 DIAGNOSIS — R0683 Snoring: Secondary | ICD-10-CM

## 2020-08-26 DIAGNOSIS — R5383 Other fatigue: Secondary | ICD-10-CM

## 2020-08-26 DIAGNOSIS — I493 Ventricular premature depolarization: Secondary | ICD-10-CM

## 2020-08-26 DIAGNOSIS — I10 Essential (primary) hypertension: Secondary | ICD-10-CM

## 2020-08-26 DIAGNOSIS — I350 Nonrheumatic aortic (valve) stenosis: Secondary | ICD-10-CM

## 2020-08-26 NOTE — Progress Notes (Signed)
Cardiology Office Note:    Date:  08/26/2020   ID:  Thomas [name], DOB [birth year], MRN [id number]  PCP:  Josetta Huddle, MD  Cardiologist:  Berniece Salines, DO  Electrophysiologist:  None   Referring MD: Josetta Huddle, MD   No chief complaint on file. Feel much better but still with some fatigue  History of Present Illness:    Thomas [name] is a 65 y.o. male with a hx of  coronary artery disease-hypertension, hyperlipidemia, obesity, mild to moderate aortic stenosis, presents today for follow-up.  During his last visit he was status post coronary CTA at that time we discussed the results from his CTA and his echo.     I saw the patient in March 2021 at that time he appeared to be doing well from a cardiovascular standpoint.  He is here today for follow-up visit.  He still appears to be happy.  He still living at home.  He offers no complaints at this time.  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   I saw the patient on May 21, 2020 at that time he appeared to be doing well from a cardiovascular standpoint.  We continued the patient on his metoprolol 12.5 mg twice daily as well as his valsartan hydrochlorothiazide.  And he was good for a year follow-up.   I saw the patient on June 27, 2019 at that time he was status post an office visit with my partner Dr. Krasowski at which time a ZIO monitor was placed on the patient, he was noted to have frequent PVCs his metoprolol was increased to 25 mg daily.  He did not tolerate the metoprolol therefore we will stop this medication and started patient on Cardizem.  Fatigue got worse we stopped the Cardizem.  In the interim the patient has seen EP and recommended at this time history of medications and we monitor him closely.  There was also concern for some sleep apnea.  He tells me since he has been off the medications his fatigue has improved but not resolved.   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   Ventral hernia without obstruction or gangrene 03/27/2016    Past Surgical History:  Procedure Laterality Date   Epididymis Excision Left    Spermocele   HEMORROIDECTOMY     HERNIA REPAIR Bilateral 1967 and 1986   x2 open inguinal hernia repairs   INCISION AND DRAINAGE OF WOUND  2017   Wound Infection Post OP   LYMPHADENECTOMY Bilateral 05/17/2014   Procedure: PELVIC LYMPHADENECTOMY;  Surgeon: Lester Borden, MD;  Location: [facility];  Service: Urology;  Laterality: Bilateral;   PROSTATE BIOPSY  03/22/14   Lester Borden   ROBOT ASSISTED LAPAROSCOPIC RADICAL PROSTATECTOMY N/A 05/17/2014   Procedure: ROBOTIC ASSISTED LAPAROSCOPIC RADICAL PROSTATECTOMY LEVEL 2;  Surgeon: Lester Borden, MD;  Location: [facility];  Service: Urology;  Laterality: N/A;   SKIN TAG REMOVAL     15  lesions   VASECTOMY  VENTRAL HERNIA REPAIR N/A 03/27/2016   Procedure: LAPAROSCOPIC VENTRAL HERNIA;  Surgeon: Alphonsa Overall, MD;  Location: [facility];  Service: General;  Laterality: N/A;  WITH MESH    Current Medications: Current Meds  Medication Sig   aspirin EC 81 MG tablet Take 81 mg by mouth daily. Swallow whole.   Cholecalciferol (VITAMIN D) 2000 units tablet Take 2,000 Units by mouth daily.   Multiple Vitamins-Minerals (MULTIVITAMIN WITH MINERALS) tablet Take 1 tablet by mouth daily.   rosuvastatin (CRESTOR) 20 MG tablet TAKE 1 TABLET BY MOUTH EVERY DAY   TRINTELLIX 20 MG TABS tablet Take 20 mg by mouth daily.   valsartan-hydrochlorothiazide (DIOVAN-HCT) 320-25 MG per tablet Take 1 tablet by mouth every morning.      Allergies:   Amoxicillin-pot clavulanate and Topiramate   Social History    Socioeconomic History   Marital status: Married    Spouse name: Not on file   Number of children: 2   Years of education: Not on file   Highest education level: Not on file  Occupational History   Occupation: Sales  Tobacco Use   Smoking status: Never   Smokeless tobacco: Never  Substance and Sexual Activity   Alcohol use: No    Alcohol/week: 0.0 standard drinks   Drug use: No   Sexual activity: Not on file  Other Topics Concern   Not on file  Social History Narrative   Not on file   Social Determinants of Health   Financial Resource Strain: Not on file  Food Insecurity: Not on file  Transportation Needs: Not on file  Physical Activity: Not on file  Stress: Not on file  Social Connections: Not on file     Family History: The patient's family history includes COPD in his mother; Cancer in his mother; Heart attack in his mother and paternal grandfather; Heart failure in his mother; Ovarian cancer in his mother.  ROS:   Review of Systems  Constitution: Negative for decreased appetite, fever and weight gain.  HENT: Negative for congestion, ear discharge, hoarse voice and sore throat.   Eyes: Negative for discharge, redness, vision loss in right eye and visual halos.  Cardiovascular: Negative for chest pain, dyspnea on exertion, leg swelling, orthopnea and palpitations.  Respiratory: Negative for cough, hemoptysis, shortness of breath and snoring.   Endocrine: Negative for heat intolerance and polyphagia.  Hematologic/Lymphatic: Negative for bleeding problem. Does not bruise/bleed easily.  Skin: Negative for flushing, nail changes, rash and suspicious lesions.  Musculoskeletal: Negative for arthritis, joint pain, muscle cramps, myalgias, neck pain and stiffness.  Gastrointestinal: Negative for abdominal pain, bowel incontinence, diarrhea and excessive appetite.  Genitourinary: Negative for decreased libido, genital sores and incomplete emptying.  Neurological: Negative  for brief paralysis, focal weakness, headaches and loss of balance.  Psychiatric/Behavioral: Negative for altered mental status, depression and suicidal ideas.  Allergic/Immunologic: Negative for HIV exposure and persistent infections.    EKGs/Labs/Other Studies Reviewed:    The following studies were reviewed today:   EKG:  None today  ZIO monitor Patch Wear Time:  5 days and 21 hours (2022-03-16T15:25:37-0400 to 2022-03-22T12:34:48-0400)   Patient had a min HR of 42 bpm, max HR of 167 bpm, and avg HR of 65 bpm. Predominant underlying rhythm was Sinus Rhythm. Intermittent Bundle Branch Block was present. QRS morphology changes were present due to Intermittent Bundle Branch Block. 1 run of Ventricular Tachycardia occurred lasting 4 beats with a max rate of 167 bpm (avg 145 bpm). Isolated SVEs were rare (<  1.0%), SVE Couplets were rare (<1.0%), and SVE Triplets were rare (<1.0%). Isolated VEs were frequent (6.3%, [id number]), VE Couplets were rare (<1.0%, 173), and no VE Triplets were present. Ventricular Trigeminy was present.   Summary and conclusions: 1 episode of ventricular tachycardia total of 4 beats at rate of 167 Ventricular ectopy with total burden of 6.3%. 1 triggered event for PVCs    Recent Labs: 05/29/2020: BUN 18; Creatinine, Ser 1.00; Magnesium 2.2; Potassium 4.2; Sodium 140; TSH 1.760  Recent Lipid Panel No results found for: CHOL, TRIG, HDL, CHOLHDL, VLDL, LDLCALC, LDLDIRECT  Physical Exam:    VS:  BP 130/88   Pulse 72   Ht 5\' 11"  (1.803 m)   Wt 293 lb (132.9 kg)   SpO2 96%   BMI 40.87 kg/m     Wt Readings from Last 3 Encounters:  08/26/20 293 lb (132.9 kg)  08/01/20 285 lb (129.3 kg)  06/26/20 293 lb (132.9 kg)     GEN: Well nourished, well developed in no acute distress HEENT: Normal NECK: No JVD; No carotid bruits LYMPHATICS: No lymphadenopathy CARDIAC: S1S2 noted,RRR, no murmurs, rubs, gallops RESPIRATORY:  Clear to auscultation without rales, wheezing  or rhonchi  ABDOMEN: Soft, non-tender, non-distended, +bowel sounds, no guarding. EXTREMITIES: No edema, No cyanosis, no clubbing MUSCULOSKELETAL:  No deformity  SKIN: Warm and dry NEUROLOGIC:  Alert and oriented x 3, non-focal PSYCHIATRIC:  Normal affect, good insight  ASSESSMENT:    1. Fatigue, unspecified type   2. Snoring   3. PVC (premature ventricular contraction)   4. Coronary artery disease involving native coronary artery of native heart without angina pectoris   5. Essential hypertension   6. Nonrheumatic aortic valve stenosis   7. Morbid obesity ([place])    PLAN:     His fatigue has improved.  But not resolved.  He also reports snoring therefore with this information he will be appropriate to pursue a sleep study in this patient to make sure sleep apnea is not playing a role here as he does have risk factors for sleep apnea. He is agreeable therefore sleep study will be ordered.  He recently experienced some PVC symptoms after having some caffeine, we talked about cutting back on caffeine use and monitoring symptoms for now.  The patient understands the need to lose weight with diet and exercise. We have discussed specific strategies for this.  He is planning to increase his activities for exercise.  No angina symptoms continue patient's current medication regimen.    The patient is in agreement with the above plan. The patient left the office in stable condition.  The patient will follow up in   Medication Adjustments/Labs and Tests Ordered: Current medicines are reviewed at length with the patient today.  Concerns regarding medicines are outlined above.  Orders Placed This Encounter  Procedures   Split night study   No orders of the defined types were placed in this encounter.   Patient Instructions  Medication Instructions:  Your physician recommends that you continue on your current medications as directed. Please refer to the Current Medication list given to  you today.  *If you need a refill on your cardiac medications before your next appointment, please call your pharmacy*   Lab Work: None If you have labs (blood work) drawn today and your tests are completely normal, you will receive your results only by: [place] (if you have MyChart) OR A paper copy in the mail If you have any lab test that is  abnormal or we need to change your treatment, we will call you to review the results.   Testing/Procedures: Your physician has recommended that you have a sleep study. This test records several body functions during sleep, including: brain activity, eye movement, oxygen and carbon dioxide blood levels, heart rate and rhythm, breathing rate and rhythm, the flow of air through your mouth and nose, snoring, body muscle movements, and chest and belly movement.    Follow-Up: At [facility], you and your health needs are our priority.  As part of our continuing mission to provide you with exceptional heart care, we have created designated Provider Care Teams.  These Care Teams include your primary Cardiologist (physician) and Advanced Practice Providers (APPs -  Physician Assistants and Nurse Practitioners) who all work together to provide you with the care you need, when you need it.  We recommend signing up for the patient portal called "MyChart".  Sign up information is provided on this After Visit Summary.  MyChart is used to connect with patients for Virtual Visits (Telemedicine).  Patients are able to view lab/test results, encounter notes, upcoming appointments, etc.  Non-urgent messages can be sent to your provider as well.   To learn more about what you can do with MyChart, go to [email or web address].    Your next appointment:   6 month(s)  The format for your next appointment:   In Person  Provider:   {[address] - [facility] Pick Melyna Huron, DO   Other Instructions Sleep Study, Adult A sleep study (polysomnogram) is a series of tests  done while you are sleeping. A sleep study records your brain waves, heart rate, breathing rate, oxygen level, and eye and legmovements. A sleep study helps your health care provider: See how well you sleep. Diagnose a sleep disorder. Determine how severe your sleep disorder is. Create a plan to treat your sleep disorder. Your health care provider may recommend a sleep study if you: Feel sleepy on most days. Snore loudly while sleeping. Have unusual behaviors while you sleep, such as walking. Have brief periods in which you stop breathing during sleep (sleepapnea). Fall asleep suddenly during the day (narcolepsy). Have trouble falling asleep or staying asleep (insomnia). Feel like you need to move your legs when trying to fall asleep (restless legs syndrome). Move your legs by flexing and extending them regularly while asleep (periodic limb movement disorder). Act out your dreams while you sleep (sleep behavior disorder). Feel like you cannot move when you first wake up (sleep paralysis). What tests are part of a sleep study? Most sleep studies record the following during sleep: Brain activity. Eye movements. Heart rate and rhythm. Breathing rate and rhythm. Blood-oxygen level. Blood pressure. Chest and belly movement as you breathe. Arm and leg movements. Snoring or other noises. Body position. Where are sleep studies done? Sleep studies are done at sleep centers. A sleep center may be inside [place], office, or clinic. The room where you have the study may look like a hospital room or a hotel room. The health care providers doing the study may come in and out of the room during the study. Most of the time, they will be in another room monitoringyour test as you sleep. How are sleep studies done? Most sleep studies are done during a normal period of time for a full night of sleep. You will arrive at the study center in the evening and go home in themorning. Before the test Bring  your pajamas and toothbrush with you to  the sleep study. Do not have caffeine on the day of your sleep study. Do not drink alcohol on the day of your sleep study. Your health care provider will let you know if you should stop taking any of your regular medicines before the test. During the test     Round, sticky patches with sensors attached to recording wires (electrodes) are placed on your scalp, face, chest, and limbs. Wires from all the electrodes and sensors run from your bed to a computer. The wires can be taken off and put back on if you need to get out of bed to go to the bathroom. A sensor is placed over your nose to measure airflow. A finger clip is put on your finger or ear to measure your blood oxygen level (pulse oximetry). A belt is placed around your belly and a belt is placed around your chest to measure breathing movements. If you have signs of the sleep disorder called sleep apnea during your test, you may get a treatment mask to wear for the second half of the night. The mask provides positive airway pressure (PAP) to help you breathe better during sleep. This may greatly improve your sleep apnea. You will then have all tests done again with the mask in place to see if your measurements and recordings change. After the test A medical doctor who specializes in sleep will evaluate the results of your sleep study and share them with you and your primary health care provider. Based on your results, your medical history, and a physical exam, you may be diagnosed with a sleep disorder, such as: Sleep apnea. Restless legs syndrome. Sleep-related behavior disorder. Sleep-related movement disorders. Sleep-related seizure disorders. Your health care team will help determine your treatment options based on your diagnosis. This may include: Improving your sleep habits (sleep hygiene). Wearing a continuous positive airway pressure (CPAP) or bi-level positive airway pressure (BPAP)  mask. Wearing an oral device at night to improve breathing and reduce snoring. Taking medicines. Follow these instructions at home: Take over-the-counter and prescription medicines only as told by your health care provider. If you are instructed to use a CPAP or BPAP mask, make sure you use it nightly as directed. Make any lifestyle changes that your health care provider recommends. If you were given a device to open your airway while you sleep, use it only as told by your health care provider. Do not use any tobacco products, such as cigarettes, chewing tobacco, and e-cigarettes. If you need help quitting, ask your health care provider. Keep all follow-up visits as told by your health care provider. This is important. Summary A sleep study (polysomnogram) is a series of tests done while you are sleeping. It shows how well you sleep. Most sleep studies are done over one full night of sleep. You will arrive at the study center in the evening and go home in the morning. If you have signs of the sleep disorder called sleep apnea during your test, you may get a treatment mask to wear for the second half of the night. A medical doctor who specializes in sleep will evaluate the results of your sleep study and share them with your primary health care provider. This information is not intended to replace advice given to you by your health care provider. Make sure you discuss any questions you have with your healthcare provider. Document Revised: 04/07/2019 Document Reviewed: 03/30/2017 Elsevier Patient Education  2022 [place].    Adopting a Healthy Lifestyle.  Know what a healthy weight is for you (roughly BMI <25) and aim to maintain this   Aim for 7+ servings of fruits and vegetables daily   65-80+ fluid ounces of water or unsweet tea for healthy kidneys   Limit to max 1 drink of alcohol per day; avoid smoking/tobacco   Limit animal fats in diet for cholesterol and heart health - choose  grass fed whenever available   Avoid highly processed foods, and foods high in saturated/trans fats   Aim for low stress - take time to unwind and care for your mental health   Aim for 150 min of moderate intensity exercise weekly for heart health, and weights twice weekly for bone health   Aim for 7-9 hours of sleep daily   When it comes to diets, agreement about the perfect plan isnt easy to find, even among the experts. Experts at the [place] developed an idea known as the Healthy Eating Plate. Just imagine a plate divided into logical, healthy portions.   The emphasis is on diet quality:   Load up on vegetables and fruits - one-half of your plate: Aim for color and variety, and remember that potatoes dont count.   Go for whole grains - one-quarter of your plate: Whole wheat, barley, wheat berries, quinoa, oats, brown rice, and foods made with them. If you want pasta, go with whole wheat pasta.   Protein power - one-quarter of your plate: Fish, chicken, beans, and nuts are all healthy, versatile protein sources. Limit red meat.   The diet, however, does go beyond the plate, offering a few other suggestions.   Use healthy plant oils, such as olive, canola, soy, corn, sunflower and peanut. Check the labels, and avoid partially hydrogenated oil, which have unhealthy trans fats.   If youre thirsty, drink water. Coffee and tea are good in moderation, but skip sugary drinks and limit milk and dairy products to one or two daily servings.   The type of carbohydrate in the diet is more important than the amount. Some sources of carbohydrates, such as vegetables, fruits, whole grains, and beans-are healthier than others.   Finally, stay active  Signed, Berniece Salines, DO  08/26/2020 4:43 PM    [facility]

## 2020-08-26 NOTE — Patient Instructions (Signed)
Medication Instructions:  Your physician recommends that you continue on your current medications as directed. Please refer to the Current Medication list given to you today.  *If you need a refill on your cardiac medications before your next appointment, please call your pharmacy*   Lab Work: None If you have labs (blood work) drawn today and your tests are completely normal, you will receive your results only by: [place] (if you have MyChart) OR A paper copy in the mail If you have any lab test that is abnormal or we need to change your treatment, we will call you to review the results.   Testing/Procedures: Your physician has recommended that you have a sleep study. This test records several body functions during sleep, including: brain activity, eye movement, oxygen and carbon dioxide blood levels, heart rate and rhythm, breathing rate and rhythm, the flow of air through your mouth and nose, snoring, body muscle movements, and chest and belly movement.    Follow-Up: At [facility], you and your health needs are our priority.  As part of our continuing mission to provide you with exceptional heart care, we have created designated Provider Care Teams.  These Care Teams include your primary Cardiologist (physician) and Advanced Practice Providers (APPs -  Physician Assistants and Nurse Practitioners) who all work together to provide you with the care you need, when you need it.  We recommend signing up for the patient portal called "MyChart".  Sign up information is provided on this After Visit Summary.  MyChart is used to connect with patients for Virtual Visits (Telemedicine).  Patients are able to view lab/test results, encounter notes, upcoming appointments, etc.  Non-urgent messages can be sent to your provider as well.   To learn more about what you can do with MyChart, go to [email or web address].    Your next appointment:   6 month(s)  The format for your next  appointment:   In Person  Provider:   {[address] - Godfrey Pick Tobb, DO   Other Instructions Sleep Study, Adult A sleep study (polysomnogram) is a series of tests done while you are sleeping. A sleep study records your brain waves, heart rate, breathing rate, oxygen level, and eye and legmovements. A sleep study helps your health care provider: See how well you sleep. Diagnose a sleep disorder. Determine how severe your sleep disorder is. Create a plan to treat your sleep disorder. Your health care provider may recommend a sleep study if you: Feel sleepy on most days. Snore loudly while sleeping. Have unusual behaviors while you sleep, such as walking. Have brief periods in which you stop breathing during sleep (sleepapnea). Fall asleep suddenly during the day (narcolepsy). Have trouble falling asleep or staying asleep (insomnia). Feel like you need to move your legs when trying to fall asleep (restless legs syndrome). Move your legs by flexing and extending them regularly while asleep (periodic limb movement disorder). Act out your dreams while you sleep (sleep behavior disorder). Feel like you cannot move when you first wake up (sleep paralysis). What tests are part of a sleep study? Most sleep studies record the following during sleep: Brain activity. Eye movements. Heart rate and rhythm. Breathing rate and rhythm. Blood-oxygen level. Blood pressure. Chest and belly movement as you breathe. Arm and leg movements. Snoring or other noises. Body position. Where are sleep studies done? Sleep studies are done at sleep centers. A sleep center may be inside [place], office, or clinic. The room where you have the study may  look like a hospital room or a hotel room. The health care providers doing the study may come in and out of the room during the study. Most of the time, they will be in another room monitoringyour test as you sleep. How are sleep studies done? Most sleep  studies are done during a normal period of time for a full night of sleep. You will arrive at the study center in the evening and go home in themorning. Before the test Bring your pajamas and toothbrush with you to the sleep study. Do not have caffeine on the day of your sleep study. Do not drink alcohol on the day of your sleep study. Your health care provider will let you know if you should stop taking any of your regular medicines before the test. During the test     Round, sticky patches with sensors attached to recording wires (electrodes) are placed on your scalp, face, chest, and limbs. Wires from all the electrodes and sensors run from your bed to a computer. The wires can be taken off and put back on if you need to get out of bed to go to the bathroom. A sensor is placed over your nose to measure airflow. A finger clip is put on your finger or ear to measure your blood oxygen level (pulse oximetry). A belt is placed around your belly and a belt is placed around your chest to measure breathing movements. If you have signs of the sleep disorder called sleep apnea during your test, you may get a treatment mask to wear for the second half of the night. The mask provides positive airway pressure (PAP) to help you breathe better during sleep. This may greatly improve your sleep apnea. You will then have all tests done again with the mask in place to see if your measurements and recordings change. After the test A medical doctor who specializes in sleep will evaluate the results of your sleep study and share them with you and your primary health care provider. Based on your results, your medical history, and a physical exam, you may be diagnosed with a sleep disorder, such as: Sleep apnea. Restless legs syndrome. Sleep-related behavior disorder. Sleep-related movement disorders. Sleep-related seizure disorders. Your health care team will help determine your treatment options based on your  diagnosis. This may include: Improving your sleep habits (sleep hygiene). Wearing a continuous positive airway pressure (CPAP) or bi-level positive airway pressure (BPAP) mask. Wearing an oral device at night to improve breathing and reduce snoring. Taking medicines. Follow these instructions at home: Take over-the-counter and prescription medicines only as told by your health care provider. If you are instructed to use a CPAP or BPAP mask, make sure you use it nightly as directed. Make any lifestyle changes that your health care provider recommends. If you were given a device to open your airway while you sleep, use it only as told by your health care provider. Do not use any tobacco products, such as cigarettes, chewing tobacco, and e-cigarettes. If you need help quitting, ask your health care provider. Keep all follow-up visits as told by your health care provider. This is important. Summary A sleep study (polysomnogram) is a series of tests done while you are sleeping. It shows how well you sleep. Most sleep studies are done over one full night of sleep. You will arrive at the study center in the evening and go home in the morning. If you have signs of the sleep disorder called sleep apnea  during your test, you may get a treatment mask to wear for the second half of the night. A medical doctor who specializes in sleep will evaluate the results of your sleep study and share them with your primary health care provider. This information is not intended to replace advice given to you by your health care provider. Make sure you discuss any questions you have with your healthcare provider. Document Revised: 04/07/2019 Document Reviewed: 03/30/2017 Elsevier Patient Education  2022 [employer].

## 2020-09-02 ENCOUNTER — Telehealth: Payer: Self-pay | Admitting: *Deleted

## 2020-09-02 NOTE — Telephone Encounter (Signed)
-----   Message from Orvan July, RN sent at 08/27/2020 12:01 PM EDT ----- Needs precert for sleep study.  Thank you,    [facility]

## 2020-09-02 NOTE — Telephone Encounter (Signed)
Submitted PA request for split night sleep study to [facility]. Request was denied. Does not meet medical qualifications. Approved Home sleep test.  Auth # [id number]. Valid dates 09/02/20 to 10/31/20.

## 2020-09-10 DIAGNOSIS — R0789 Other chest pain: Secondary | ICD-10-CM | POA: Diagnosis not present

## 2020-09-18 ENCOUNTER — Other Ambulatory Visit: Payer: Self-pay

## 2020-09-18 DIAGNOSIS — R0683 Snoring: Secondary | ICD-10-CM

## 2020-09-19 ENCOUNTER — Telehealth: Payer: Self-pay

## 2020-09-19 NOTE — Telephone Encounter (Signed)
Called patient, left a message with his sleep study appointment information.  Patient told to send a MyChart message or call if he has any questions.

## 2020-10-03 DIAGNOSIS — F331 Major depressive disorder, recurrent, moderate: Secondary | ICD-10-CM | POA: Diagnosis not present

## 2020-10-09 ENCOUNTER — Other Ambulatory Visit: Payer: Self-pay | Admitting: Cardiology

## 2020-10-23 ENCOUNTER — Encounter (HOSPITAL_BASED_OUTPATIENT_CLINIC_OR_DEPARTMENT_OTHER): Payer: BC Managed Care – PPO | Admitting: Cardiovascular Disease

## 2020-10-23 DIAGNOSIS — F331 Major depressive disorder, recurrent, moderate: Secondary | ICD-10-CM | POA: Diagnosis not present

## 2020-11-19 DIAGNOSIS — F331 Major depressive disorder, recurrent, moderate: Secondary | ICD-10-CM | POA: Diagnosis not present

## 2020-11-25 ENCOUNTER — Encounter (HOSPITAL_BASED_OUTPATIENT_CLINIC_OR_DEPARTMENT_OTHER): Payer: BC Managed Care – PPO | Admitting: Cardiovascular Disease

## 2020-12-06 DIAGNOSIS — C61 Malignant neoplasm of prostate: Secondary | ICD-10-CM | POA: Diagnosis not present

## 2020-12-11 DIAGNOSIS — F331 Major depressive disorder, recurrent, moderate: Secondary | ICD-10-CM | POA: Diagnosis not present

## 2020-12-13 DIAGNOSIS — N5201 Erectile dysfunction due to arterial insufficiency: Secondary | ICD-10-CM | POA: Diagnosis not present

## 2020-12-13 DIAGNOSIS — C61 Malignant neoplasm of prostate: Secondary | ICD-10-CM | POA: Diagnosis not present

## 2021-01-16 DIAGNOSIS — M9903 Segmental and somatic dysfunction of lumbar region: Secondary | ICD-10-CM | POA: Diagnosis not present

## 2021-01-16 DIAGNOSIS — M9901 Segmental and somatic dysfunction of cervical region: Secondary | ICD-10-CM | POA: Diagnosis not present

## 2021-01-16 DIAGNOSIS — M5441 Lumbago with sciatica, right side: Secondary | ICD-10-CM | POA: Diagnosis not present

## 2021-01-16 DIAGNOSIS — M5382 Other specified dorsopathies, cervical region: Secondary | ICD-10-CM | POA: Diagnosis not present

## 2021-01-17 DIAGNOSIS — M9903 Segmental and somatic dysfunction of lumbar region: Secondary | ICD-10-CM | POA: Diagnosis not present

## 2021-01-17 DIAGNOSIS — M9901 Segmental and somatic dysfunction of cervical region: Secondary | ICD-10-CM | POA: Diagnosis not present

## 2021-01-17 DIAGNOSIS — M5382 Other specified dorsopathies, cervical region: Secondary | ICD-10-CM | POA: Diagnosis not present

## 2021-01-17 DIAGNOSIS — M5441 Lumbago with sciatica, right side: Secondary | ICD-10-CM | POA: Diagnosis not present

## 2021-01-21 DIAGNOSIS — M5441 Lumbago with sciatica, right side: Secondary | ICD-10-CM | POA: Diagnosis not present

## 2021-01-21 DIAGNOSIS — M9903 Segmental and somatic dysfunction of lumbar region: Secondary | ICD-10-CM | POA: Diagnosis not present

## 2021-01-21 DIAGNOSIS — M5382 Other specified dorsopathies, cervical region: Secondary | ICD-10-CM | POA: Diagnosis not present

## 2021-01-21 DIAGNOSIS — M9901 Segmental and somatic dysfunction of cervical region: Secondary | ICD-10-CM | POA: Diagnosis not present

## 2021-01-23 DIAGNOSIS — M5441 Lumbago with sciatica, right side: Secondary | ICD-10-CM | POA: Diagnosis not present

## 2021-01-23 DIAGNOSIS — M9901 Segmental and somatic dysfunction of cervical region: Secondary | ICD-10-CM | POA: Diagnosis not present

## 2021-01-23 DIAGNOSIS — M9903 Segmental and somatic dysfunction of lumbar region: Secondary | ICD-10-CM | POA: Diagnosis not present

## 2021-01-23 DIAGNOSIS — M5382 Other specified dorsopathies, cervical region: Secondary | ICD-10-CM | POA: Diagnosis not present

## 2021-02-04 DIAGNOSIS — R252 Cramp and spasm: Secondary | ICD-10-CM | POA: Diagnosis not present

## 2021-02-10 ENCOUNTER — Telehealth: Payer: Self-pay

## 2021-02-10 NOTE — Telephone Encounter (Signed)
Letter has been sent to patient instructing them to call us if they are still interested in completing their sleep study. If we have not received a response from the patient within 30 days of this notice, the order will be cancelled and they will need to discuss the need for a sleep study at their next office visit.  ° °

## 2021-02-14 ENCOUNTER — Other Ambulatory Visit: Payer: Self-pay | Admitting: Cardiology

## 2021-02-25 ENCOUNTER — Ambulatory Visit (INDEPENDENT_AMBULATORY_CARE_PROVIDER_SITE_OTHER): Payer: 59 | Admitting: Cardiology

## 2021-02-25 ENCOUNTER — Other Ambulatory Visit: Payer: Self-pay

## 2021-02-25 ENCOUNTER — Encounter: Payer: Self-pay | Admitting: Cardiology

## 2021-02-25 VITALS — BP 130/90 | HR 67 | Ht 71.0 in | Wt 286.0 lb

## 2021-02-25 DIAGNOSIS — E782 Mixed hyperlipidemia: Secondary | ICD-10-CM

## 2021-02-25 DIAGNOSIS — I10 Essential (primary) hypertension: Secondary | ICD-10-CM | POA: Diagnosis not present

## 2021-02-25 DIAGNOSIS — I350 Nonrheumatic aortic (valve) stenosis: Secondary | ICD-10-CM | POA: Diagnosis not present

## 2021-02-25 DIAGNOSIS — I493 Ventricular premature depolarization: Secondary | ICD-10-CM

## 2021-02-25 DIAGNOSIS — I2510 Atherosclerotic heart disease of native coronary artery without angina pectoris: Secondary | ICD-10-CM

## 2021-02-25 DIAGNOSIS — E669 Obesity, unspecified: Secondary | ICD-10-CM

## 2021-02-25 LAB — LIPID PANEL
Chol/HDL Ratio: 3.7 ratio (ref 0.0–5.0)
Cholesterol, Total: 157 mg/dL (ref 100–199)
HDL: 42 mg/dL (ref >39)
LDL Chol Calc (NIH): 94 mg/dL (ref 0–99)
Triglycerides: 115 mg/dL (ref 0–149)
VLDL Cholesterol Cal: 21 mg/dL (ref 5–40)

## 2021-02-25 NOTE — Addendum Note (Signed)
Addended by: Orvan July on: 02/25/2021 10:11 AM   Modules accepted: Orders

## 2021-02-25 NOTE — Patient Instructions (Signed)
Medication Instructions:  Your physician recommends that you continue on your current medications as directed. Please refer to the Current Medication list given to you today.  *If you need a refill on your cardiac medications before your next appointment, please call your pharmacy*   Lab Work: Your physician recommends that you return for lab work in:  TODAY: Lipids If you have labs (blood work) drawn today and your tests are completely normal, you will receive your results only by: [place] (if you have [place]) OR A paper copy in the mail If you have any lab test that is abnormal or we need to change your treatment, we will call you to review the results.   Testing/Procedures: None   Follow-Up: At [facility], you and your health needs are our priority.  As part of our continuing mission to provide you with exceptional heart care, we have created designated Provider Care Teams.  These Care Teams include your primary Cardiologist (physician) and Advanced Practice Providers (APPs -  Physician Assistants and Nurse Practitioners) who all work together to provide you with the care you need, when you need it.  We recommend signing up for the patient portal called "MyChart".  Sign up information is provided on this After Visit Summary.  MyChart is used to connect with patients for Virtual Visits (Telemedicine).  Patients are able to view lab/test results, encounter notes, upcoming appointments, etc.  Non-urgent messages can be sent to your provider as well.   To learn more about what you can do with MyChart, go to [email or web address].    Your next appointment:   1 year(s)  The format for your next appointment:   In Person  Provider:   Berniece Salines, DO     Other Instructions

## 2021-02-25 NOTE — Progress Notes (Signed)
Cardiology Office Note:    Date:  02/25/2021   ID:  Thomas [name], DOB March 19, 1955, MRN [id number]  PCP:  Josetta Huddle, MD  Cardiologist:  Berniece Salines, DO  Electrophysiologist:  None   Referring MD: Josetta Huddle, MD   " I am doing well"  History of Present Illness:    Thomas [name] is a 65 y.o. male with a hx of  coronary artery disease-hypertension, hyperlipidemia, obesity, mild to moderate aortic stenosis, presents today for follow-up.  During his last visit he was status post coronary CTA at that time we discussed the results from his CTA and his echo.     I saw the patient in March 2021 at that time he appeared to be doing well from a cardiovascular standpoint.  He is here today for follow-up visit.  He still appears to be happy.  He still living at home.  He offers no complaints at this time.  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   I saw the patient on May 21, 2020 at that time he appeared to be doing well from a cardiovascular standpoint.  We continued the patient on his metoprolol 12.5 mg twice daily as well as his valsartan hydrochlorothiazide.  And he was good for a year follow-up.   I saw the patient on June 27, 2019 at that time he was status post an office visit with my partner Dr. Agustin Cree at which time a ZIO monitor was placed on the patient, he was noted to have frequent PVCs his metoprolol was increased to 25 mg daily.  He did not tolerate the metoprolol therefore we will stop this medication and started patient on Cardizem.  Fatigue got worse we stopped the Cardizem.   I saw the patient on August 26, 2020 at that time he has seen EP and medication management was recommended.There was concern for sleep apnea.  I ordered his sleep apnea study but the patient tells that he and his wife decided that he should not get the sleep study and he needed to loose weight.   He is looking forward to retirement which is on February 1,2023.  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   Ventral hernia without obstruction or gangrene 03/27/2016    Past Surgical History:  Procedure Laterality Date   Epididymis Excision Left    Spermocele   HEMORROIDECTOMY     HERNIA REPAIR Bilateral 1967 and 1986   x2 open inguinal hernia repairs   INCISION AND DRAINAGE OF WOUND  2017   Wound Infection Post OP   LYMPHADENECTOMY Bilateral 05/17/2014   Procedure: PELVIC LYMPHADENECTOMY;  Surgeon: Raynelle Bring, MD;  Location: [facility];  Service: Urology;  Laterality: Bilateral;   PROSTATE BIOPSY  03/22/14   Raynelle Bring   ROBOT ASSISTED LAPAROSCOPIC RADICAL PROSTATECTOMY N/A 05/17/2014   Procedure: ROBOTIC ASSISTED LAPAROSCOPIC RADICAL PROSTATECTOMY LEVEL 2;  Surgeon: Raynelle Bring, MD;  Location: [facility];  Service: Urology;  Laterality: N/A;   SKIN TAG REMOVAL  15 lesions   VASECTOMY     VENTRAL HERNIA REPAIR N/A 03/27/2016   Procedure: LAPAROSCOPIC VENTRAL HERNIA;  Surgeon: Alphonsa Overall, MD;  Location: [facility];  Service: General;  Laterality: N/A;  WITH MESH    Current Medications: Current Meds  Medication Sig   aspirin EC 81 MG tablet Take 81 mg by mouth daily. Swallow whole.   Cholecalciferol (VITAMIN D) 2000 units tablet Take 1,000 Units by mouth daily.   Multiple Vitamins-Minerals (MULTIVITAMIN WITH MINERALS) tablet Take 1 tablet by mouth daily.   rosuvastatin (CRESTOR) 20 MG tablet TAKE 1 TABLET BY MOUTH EVERY DAY   TRINTELLIX 20 MG TABS tablet Take 20 mg by mouth daily.   valsartan-hydrochlorothiazide (DIOVAN-HCT) 320-25 MG per tablet Take 1 tablet by mouth every morning.      Allergies:   Amoxicillin-pot clavulanate and  Topiramate   Social History   Socioeconomic History   Marital status: Married    Spouse name: Not on file   Number of children: 2   Years of education: Not on file   Highest education level: Not on file  Occupational History   Occupation: Sales  Tobacco Use   Smoking status: Never   Smokeless tobacco: Never  Substance and Sexual Activity   Alcohol use: No    Alcohol/week: 0.0 standard drinks   Drug use: No   Sexual activity: Not on file  Other Topics Concern   Not on file  Social History Narrative   Not on file   Social Determinants of Health   Financial Resource Strain: Not on file  Food Insecurity: Not on file  Transportation Needs: Not on file  Physical Activity: Not on file  Stress: Not on file  Social Connections: Not on file     Family History: The patient's family history includes COPD in his mother; Cancer in his mother; Heart attack in his mother and paternal grandfather; Heart failure in his mother; Ovarian cancer in his mother.  ROS:   Review of Systems  Constitution: Negative for decreased appetite, fever and weight gain.  HENT: Negative for congestion, ear discharge, hoarse voice and sore throat.   Eyes: Negative for discharge, redness, vision loss in right eye and visual halos.  Cardiovascular: Negative for chest pain, dyspnea on exertion, leg swelling, orthopnea and palpitations.  Respiratory: Negative for cough, hemoptysis, shortness of breath and snoring.   Endocrine: Negative for heat intolerance and polyphagia.  Hematologic/Lymphatic: Negative for bleeding problem. Does not bruise/bleed easily.  Skin: Negative for flushing, nail changes, rash and suspicious lesions.  Musculoskeletal: Negative for arthritis, joint pain, muscle cramps, myalgias, neck pain and stiffness.  Gastrointestinal: Negative for abdominal pain, bowel incontinence, diarrhea and excessive appetite.  Genitourinary: Negative for decreased libido, genital sores and incomplete  emptying.  Neurological: Negative for brief paralysis, focal weakness, headaches and loss of balance.  Psychiatric/Behavioral: Negative for altered mental status, depression and suicidal ideas.  Allergic/Immunologic: Negative for HIV exposure and persistent infections.    EKGs/Labs/Other Studies Reviewed:    The following studies were reviewed today:   EKG:  None today   TTE 01/27/2019 IMPRESSIONS   1. Left ventricular ejection fraction, by visual estimation, is 55 to 60%. The left ventricle has normal function. Left ventricular septal wall  thickness was moderately increased. Moderately increased left ventricular posterior wall thickness. There is  moderately increased left ventricular hypertrophy.   2. Left ventricular diastolic parameters are consistent with Grade I diastolic dysfunction (impaired relaxation).   3. Global right ventricle has normal  systolic function.The right ventricular size is normal. No increase in right ventricular wall  thickness.   4. Left atrial size was normal.   5. Right atrial size was normal.   6. The mitral valve is normal in structure. No evidence of mitral valve regurgitation. No evidence of mitral stenosis.   7. The tricuspid valve is normal in structure. Tricuspid valve regurgitation is trivial.   8. The aortic valve is mildly thickened with mild calcification. There is mild-moderate aortic valve stenosis. AVA by VTI 1.10 cmsq, Mean gradient 18 mmHg, Peak velocity 2.8 m/s. Aortic valve regurgitation is not visualized.   9. The pulmonic valve was not well visualized. Pulmonic valve regurgitation is not visualized.  10. Normal pulmonary artery systolic pressure.   FINDINGS   Left Ventricle: Left ventricular ejection fraction, by visual estimation, is 55 to 60%. The left ventricle has normal function. Moderately increased  left ventricular posterior wall thickness. There is moderately increased left ventricular hypertrophy.  Concentric left ventricular  hypertrophy. Left ventricular diastolic parameters are consistent with Grade I diastolic dysfunction (impaired  relaxation). Normal left atrial pressure.  Right Ventricle: The right ventricular size is normal. No increase in right ventricular wall thickness. Global RV systolic function is has normal systolic function. The tricuspid regurgitant velocity is 2.51 m/s, and with an assumed right atrial pressure   of 3 mmHg, the estimated right ventricular systolic pressure is normal at 28.2 mmHg.   Left Atrium: Left atrial size was normal in size.   Right Atrium: Right atrial size was normal in size   Pericardium: There is no evidence of pericardial effusion.   Mitral Valve: The mitral valve is normal in structure. No evidence of mitral valve stenosis by observation. No evidence of mitral valve  regurgitation.   Tricuspid Valve: The tricuspid valve is normal in structure. Tricuspid valve regurgitation is trivial.   Aortic Valve: The aortic valve is abnormal. . There is Mild thickening and Mild calcification of the aortic valve. Aortic valve regurgitation is not  visualized. Mild to moderate aortic stenosis is present. Mild aortic valve annular calcification. There is Mild thickening of the aortic valve. Mild calcification. Aortic valve mean gradient measures 18.0 mmHg. Aortic valve peak gradient measures 32.3 mmHg. Aortic valve area, by VTI measures 1.10 cm.   Pulmonic Valve: The pulmonic valve was not well visualized. Pulmonic valve regurgitation is not visualized.   Aorta: The aortic root, ascending aorta and aortic arch are all structurally normal, with no evidence of dilitation or obstruction.   Venous: The inferior vena cava is normal in size with greater than 50% respiratory variability, suggesting right atrial pressure of 3 mmHg.   IAS/Shunts: No atrial level shunt detected by color flow Doppler. No ventricular septal defect is seen or detected. There is no evidence of an  atrial septal  defect.   Recent Labs: 05/29/2020: BUN 18; Creatinine, Ser 1.00; Magnesium 2.2; Potassium 4.2; Sodium 140; TSH 1.760  Recent Lipid Panel No results found for: CHOL, TRIG, HDL, CHOLHDL, VLDL, LDLCALC, LDLDIRECT  Physical Exam:    VS:  BP 130/90 (BP Location: Left Arm)   Pulse 67   Ht 5\' 11"  (1.803 m)   Wt 286 lb (129.7 kg)   SpO2 97%   BMI 39.89 kg/m     Wt Readings from Last 3 Encounters:  02/25/21 286 lb (129.7 kg)  08/26/20 293 lb (132.9 kg)  08/01/20 285 lb (129.3 kg)     GEN: Well nourished, well developed in no acute distress  HEENT: Normal NECK: No JVD; No carotid bruits LYMPHATICS: No lymphadenopathy CARDIAC: S1S2 noted,RRR, no murmurs, rubs, gallops RESPIRATORY:  Clear to auscultation without rales, wheezing or rhonchi  ABDOMEN: Soft, non-tender, non-distended, +bowel sounds, no guarding. EXTREMITIES: No edema, No cyanosis, no clubbing MUSCULOSKELETAL:  No deformity  SKIN: Warm and dry NEUROLOGIC:  Alert and oriented x 3, non-focal PSYCHIATRIC:  Normal affect, good insight  ASSESSMENT:    1. Coronary artery disease involving native coronary artery of native heart without angina pectoris   2. Nonrheumatic aortic valve stenosis   3. Essential hypertension   4. Frequent PVCs   5. Primary hypertension   6. Obesity (BMI 30-39.9)   7. Mixed hyperlipidemia    PLAN:    He appears to be doing well from a cardiovascular standpoint.  The patient. We will continue his current medication regimen for CAD including his Crestor 20 mg daily with aspirin 81 mg daily. Hyperlipidemia - continue with current statin medication.  We will get lipid panel today. Blood pressure is acceptable, continue with current antihypertensive regimen. Ideally benefit from a sleep study.  He suspect he does have sleep apnea profile recommended he and his wife has decided that the patient should lose some weight first. Repeat echocardiogram to reassess his aortic valve disease. The patient  understands the need to lose weight with diet and exercise. We have discussed specific strategies for this.   The patient is in agreement with the above plan. The patient left the office in stable condition.  The patient will follow up in 1 year or sooner if needed.   Medication Adjustments/Labs and Tests Ordered: Current medicines are reviewed at length with the patient today.  Concerns regarding medicines are outlined above.  Orders Placed This Encounter  Procedures   Lipid panel   ECHOCARDIOGRAM COMPLETE   No orders of the defined types were placed in this encounter.   Patient Instructions  Medication Instructions:  Your physician recommends that you continue on your current medications as directed. Please refer to the Current Medication list given to you today.  *If you need a refill on your cardiac medications before your next appointment, please call your pharmacy*   Lab Work: Your physician recommends that you return for lab work in:  TODAY: Lipids If you have labs (blood work) drawn today and your tests are completely normal, you will receive your results only by: [facility] (if you have [facility] [place]) OR A paper copy in the mail If you have any lab test that is abnormal or we need to change your treatment, we will call you to review the results.   Testing/Procedures: None   Follow-Up: At [facility], you and your health needs are our priority.  As part of our continuing mission to provide you with exceptional heart care, we have created designated Provider Care Teams.  These Care Teams include your primary Cardiologist (physician) and Advanced Practice Providers (APPs -  Physician Assistants and Nurse Practitioners) who all work together to provide you with the care you need, when you need it.  We recommend signing up for the patient portal called "MyChart".  Sign up information is provided on this After Visit Summary.  MyChart is used to connect with patients for  Virtual Visits (Telemedicine).  Patients are able to view lab/test results, encounter notes, upcoming appointments, etc.  Non-urgent messages can be sent to your provider as well.   To learn more about what you can do with MyChart, go to [email or web address].  Your next appointment:   1 year(s)  The format for your next appointment:   In Person  Provider:   Berniece Salines, DO     Other Instructions     Adopting a Healthy Lifestyle.  Know what a healthy weight is for you (roughly BMI <25) and aim to maintain this   Aim for 7+ servings of fruits and vegetables daily   65-80+ fluid ounces of water or unsweet tea for healthy kidneys   Limit to max 1 drink of alcohol per day; avoid smoking/tobacco   Limit animal fats in diet for cholesterol and heart health - choose grass fed whenever available   Avoid highly processed foods, and foods high in saturated/trans fats   Aim for low stress - take time to unwind and care for your mental health   Aim for 150 min of moderate intensity exercise weekly for heart health, and weights twice weekly for bone health   Aim for 7-9 hours of sleep daily   When it comes to diets, agreement about the perfect plan isnt easy to find, even among the experts. Experts at the [employer] developed an idea known as the Healthy Eating Plate. Just imagine a plate divided into logical, healthy portions.   The emphasis is on diet quality:   Load up on vegetables and fruits - one-half of your plate: Aim for color and variety, and remember that potatoes dont count.   Go for whole grains - one-quarter of your plate: Whole wheat, barley, wheat berries, quinoa, oats, brown rice, and foods made with them. If you want pasta, go with whole wheat pasta.   Protein power - one-quarter of your plate: Fish, chicken, beans, and nuts are all healthy, versatile protein sources. Limit red meat.   The diet, however, does go beyond the plate, offering  a few other suggestions.   Use healthy plant oils, such as olive, canola, soy, corn, sunflower and peanut. Check the labels, and avoid partially hydrogenated oil, which have unhealthy trans fats.   If youre thirsty, drink water. Coffee and tea are good in moderation, but skip sugary drinks and limit milk and dairy products to one or two daily servings.   The type of carbohydrate in the diet is more important than the amount. Some sources of carbohydrates, such as vegetables, fruits, whole grains, and beans-are healthier than others.   Finally, stay active  Signed, Berniece Salines, DO  02/25/2021 8:53 AM    [place]

## 2021-03-06 ENCOUNTER — Encounter: Payer: Self-pay | Admitting: Cardiology

## 2021-03-06 ENCOUNTER — Other Ambulatory Visit: Payer: Self-pay

## 2021-03-06 MED ORDER — EZETIMIBE 10 MG PO TABS: 10.0000 mg | ORAL_TABLET | Freq: Every day | ORAL | 3 refills | Status: DC

## 2021-03-06 MED ORDER — ROSUVASTATIN CALCIUM 40 MG PO TABS: 40.0000 mg | ORAL_TABLET | Freq: Every day | ORAL | 3 refills | Status: DC

## 2021-03-06 NOTE — Progress Notes (Signed)
Prescription sent to pharmacy.

## 2021-04-25 IMAGING — CT CT CHEST W/O CM
2 of 4 series · 15 of 36 positions shown, 18 images · non-contrast
Comparison: CT heart study 02/23/2019.

CLINICAL DATA: Right-sided chest pain for 3 months. History of
prostate cancer.

EXAM:
CT CHEST WITHOUT CONTRAST
TECHNIQUE: Multidetector CT imaging of the chest was performed following the
standard protocol without IV contrast.

[Series 2: chest 2.00 br40 s3 · axial · 0.90mm/px · z∈[+1493,+1795]mm · 12 of 179 slices shown, 15 images (1 of 2)]
[im 14/179  mediastinal]
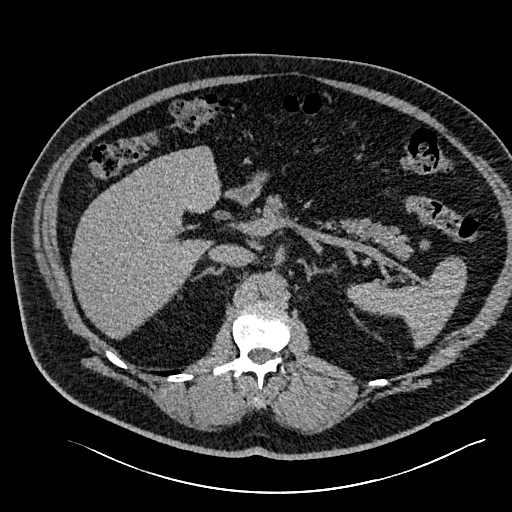
[im 14/179  lung]
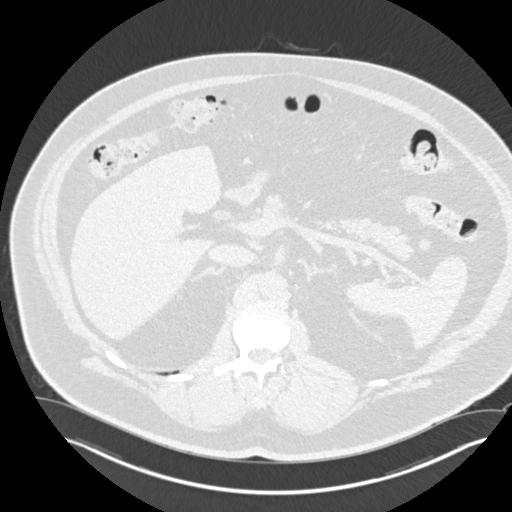
[im 28/179  lung]
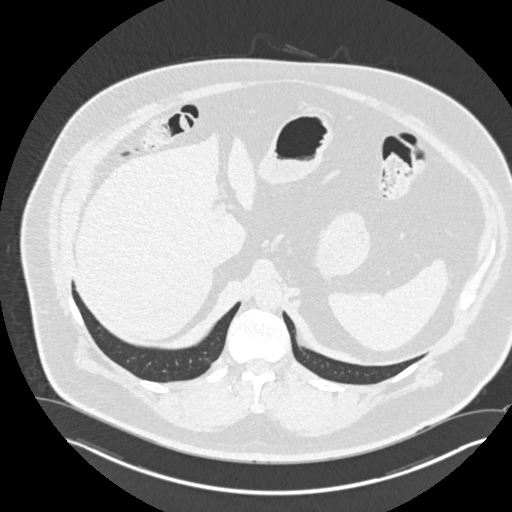
[im 42/179  lung]
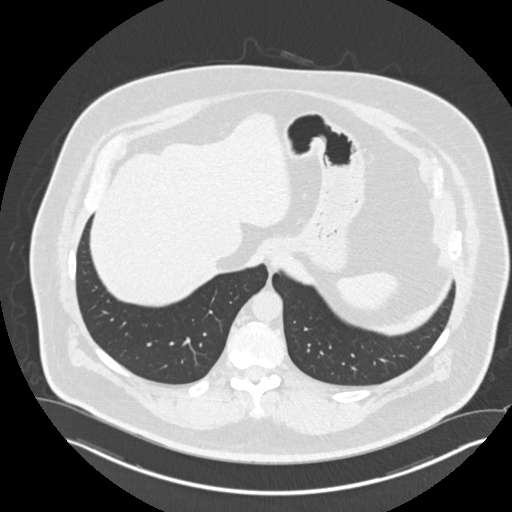
[im 55/179  lung]
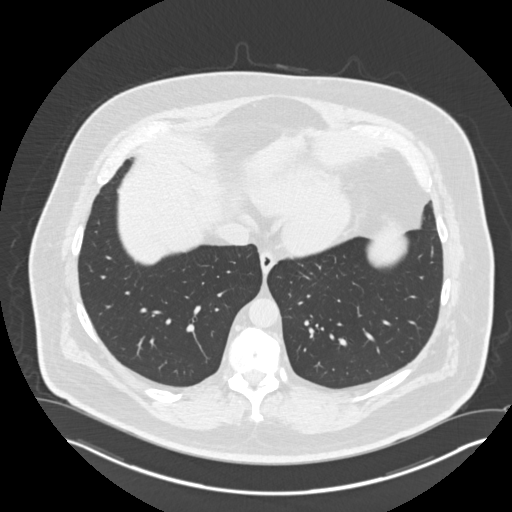
[im 69/179  mediastinal]
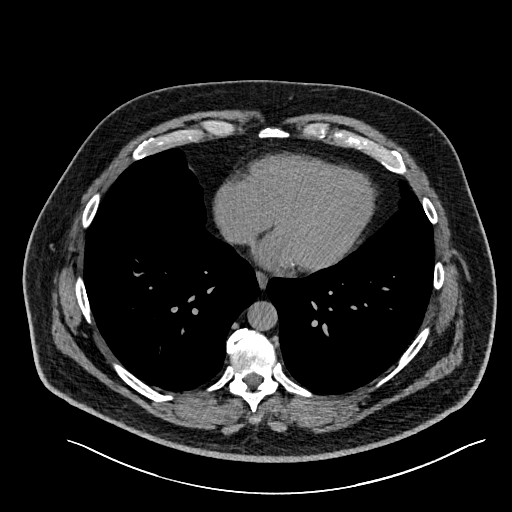
[im 69/179  lung]
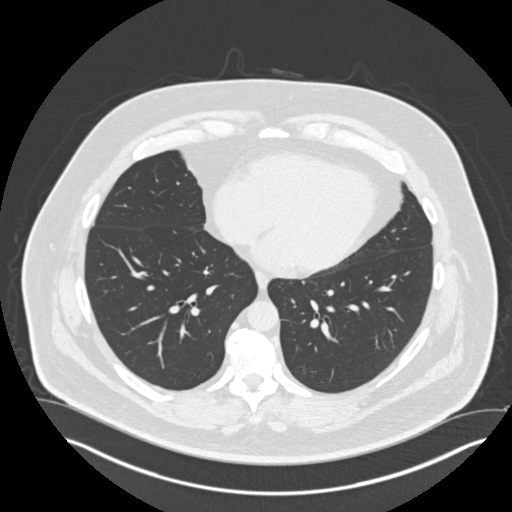
[im 83/179  lung]
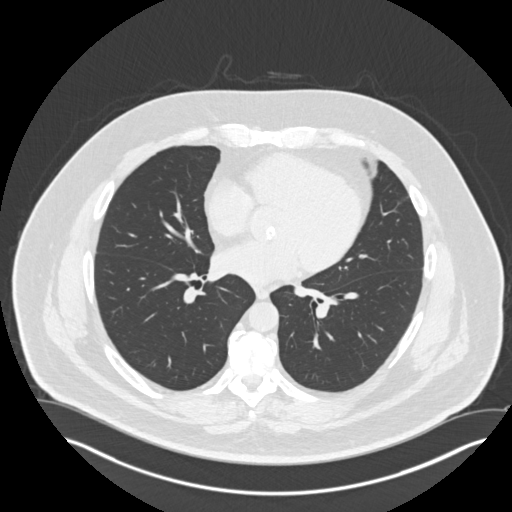
[im 96/179  lung]
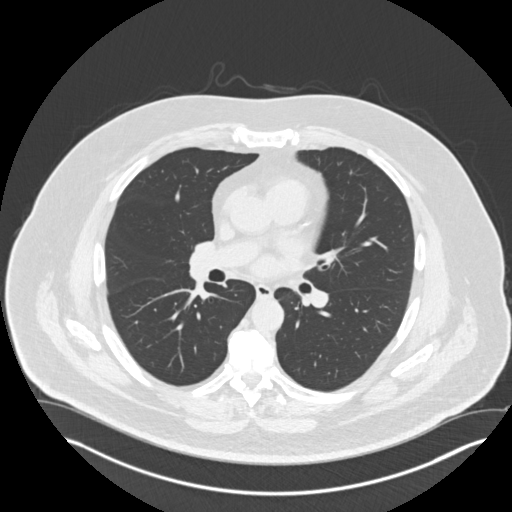
[im 110/179  lung]
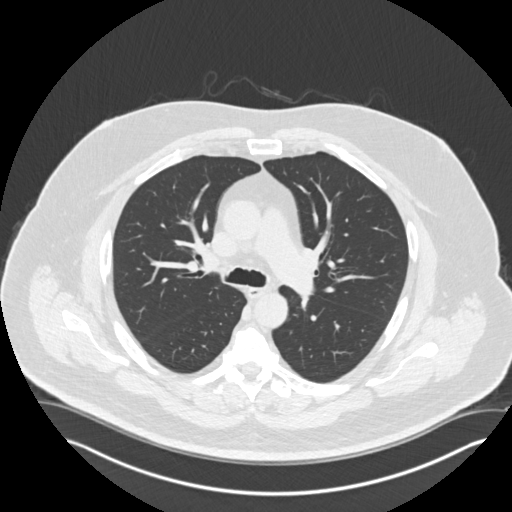
[im 124/179  mediastinal]
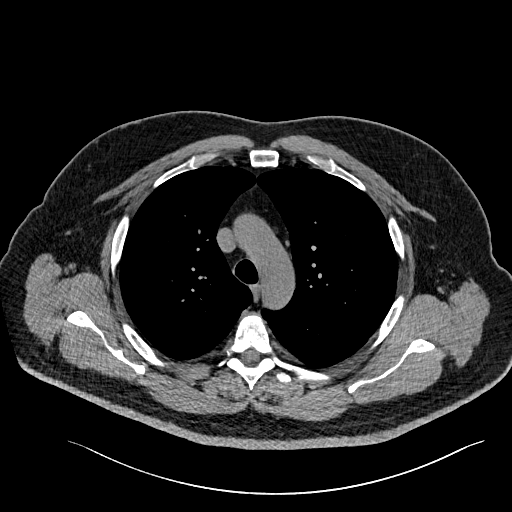
[im 124/179  lung]
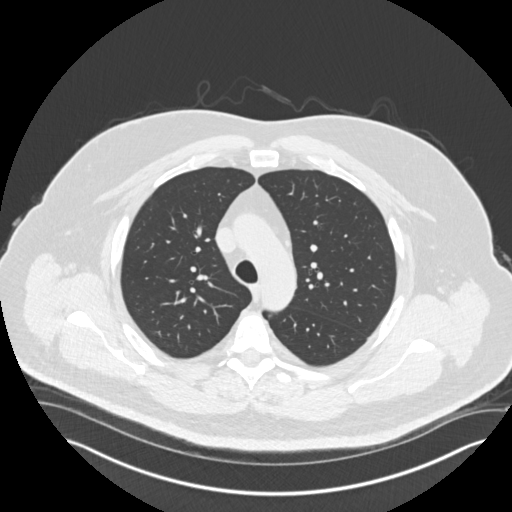
[im 137/179  lung]
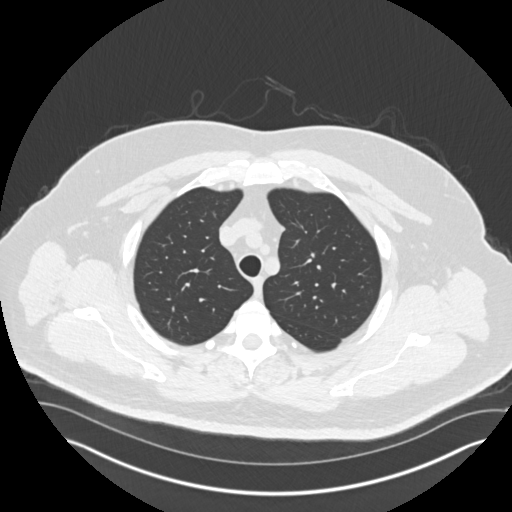
[im 151/179  lung]
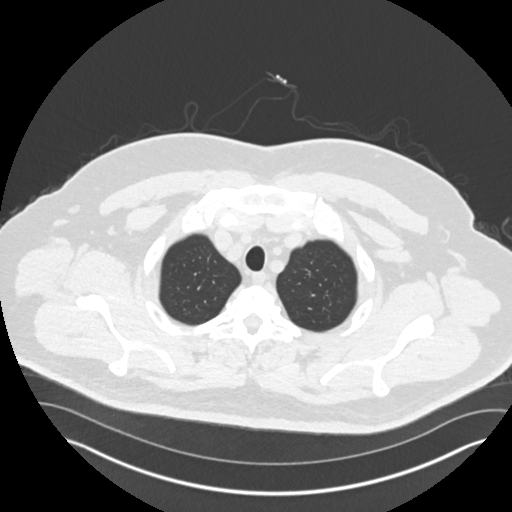
[im 165/179  lung]
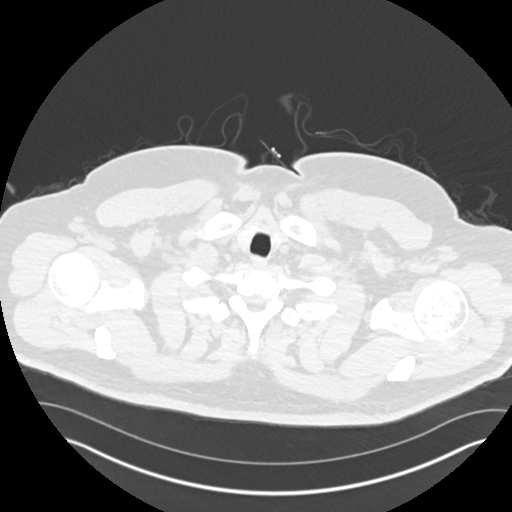

[Series 4: chest 2.00 br40 s3 · coronal · 0.70mm/px · 3 of 229 slices shown (2 of 2)]
[im 46/229  lung]
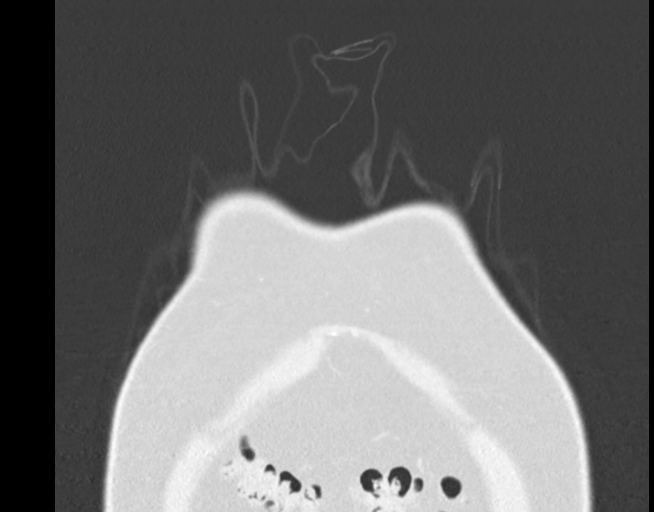
[im 92/229  lung]
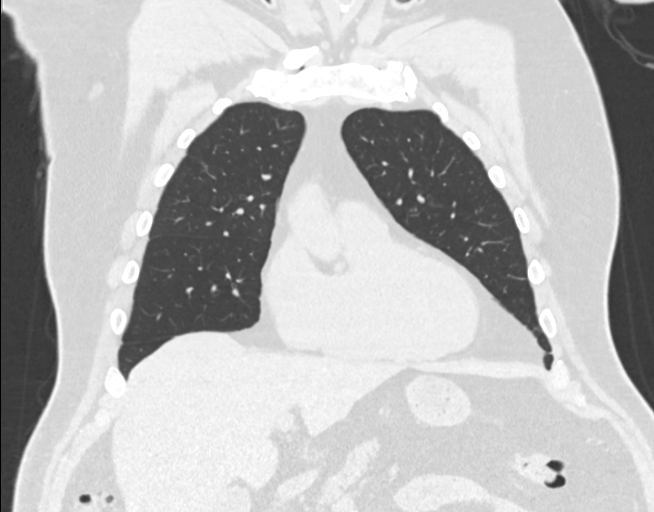
[im 137/229  lung]
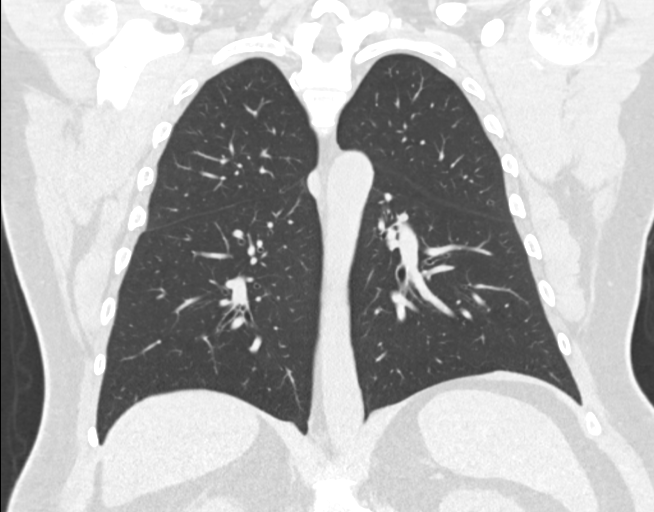

[15 of 36 positions shown; findings below may reference images not displayed]

FINDINGS: Cardiovascular: Atherosclerotic calcification of the aorta, aortic
valve and coronary arteries. Heart size normal. No pericardial
effusion.

Mediastinum/Nodes: No pathologically enlarged mediastinal or
axillary lymph nodes. Hilar regions are difficult to definitively
evaluate without IV contrast but appear grossly unremarkable.
Esophagus is grossly unremarkable.

Lungs/Pleura: Calcified granuloma in the inferior right lower lobe.
Scarring in the lingula. Lungs are otherwise clear. No pleural
fluid. Airway is unremarkable.

Upper Abdomen: Visualized portions of the liver, gallbladder,
adrenal glands, kidneys, spleen, pancreas, stomach and bowel are
grossly unremarkable.

Musculoskeletal: Degenerative changes in the spine. No worrisome
lytic or sclerotic lesions.
IMPRESSION: 1. No pulmonary parenchymal or chest wall findings to explain the
patient's chest pain.
2. Aortic atherosclerosis ([id number].G). Coronary artery
calcification.

## 2021-05-28 ENCOUNTER — Ambulatory Visit (HOSPITAL_COMMUNITY): Payer: Medicare Other | Attending: Cardiovascular Disease

## 2021-05-28 ENCOUNTER — Other Ambulatory Visit: Payer: Self-pay

## 2021-05-28 DIAGNOSIS — I350 Nonrheumatic aortic (valve) stenosis: Secondary | ICD-10-CM | POA: Diagnosis present

## 2021-05-28 LAB — ECHOCARDIOGRAM COMPLETE
AR max vel: 0.88 cm2
AV Area VTI: 0.9 cm2
AV Area mean vel: 0.93 cm2
AV Mean grad: 24.5 mmHg
AV Peak grad: 44.2 mmHg
Ao pk vel: 3.33 m/s
Area-P 1/2: 3 cm2
S' Lateral: 2.8 cm

## 2021-05-30 DIAGNOSIS — Z0289 Encounter for other administrative examinations: Secondary | ICD-10-CM

## 2021-06-10 ENCOUNTER — Other Ambulatory Visit: Payer: Self-pay

## 2021-06-10 ENCOUNTER — Ambulatory Visit (INDEPENDENT_AMBULATORY_CARE_PROVIDER_SITE_OTHER): Payer: Medicare Other | Admitting: Family Medicine

## 2021-06-10 ENCOUNTER — Encounter (INDEPENDENT_AMBULATORY_CARE_PROVIDER_SITE_OTHER): Payer: Self-pay | Admitting: Family Medicine

## 2021-06-10 VITALS — BP 125/85 | HR 65 | Temp 98.1°F | Ht 70.0 in | Wt 285.0 lb

## 2021-06-10 DIAGNOSIS — Z1331 Encounter for screening for depression: Secondary | ICD-10-CM

## 2021-06-10 DIAGNOSIS — Z6841 Body Mass Index (BMI) 40.0 and over, adult: Secondary | ICD-10-CM | POA: Diagnosis not present

## 2021-06-10 DIAGNOSIS — F39 Unspecified mood [affective] disorder: Secondary | ICD-10-CM

## 2021-06-10 DIAGNOSIS — R0602 Shortness of breath: Secondary | ICD-10-CM | POA: Diagnosis not present

## 2021-06-10 DIAGNOSIS — E66813 Obesity, class 3: Secondary | ICD-10-CM

## 2021-06-10 DIAGNOSIS — R5383 Other fatigue: Secondary | ICD-10-CM

## 2021-06-10 DIAGNOSIS — D538 Other specified nutritional anemias: Secondary | ICD-10-CM

## 2021-06-10 DIAGNOSIS — I10 Essential (primary) hypertension: Secondary | ICD-10-CM | POA: Diagnosis not present

## 2021-06-10 DIAGNOSIS — E559 Vitamin D deficiency, unspecified: Secondary | ICD-10-CM

## 2021-06-10 DIAGNOSIS — E669 Obesity, unspecified: Secondary | ICD-10-CM

## 2021-06-10 DIAGNOSIS — E782 Mixed hyperlipidemia: Secondary | ICD-10-CM

## 2021-06-11 LAB — CBC WITH DIFFERENTIAL/PLATELET
Basophils Absolute: 0.1 10*3/uL (ref 0.0–0.2)
Basos: 1 %
EOS (ABSOLUTE): 0.1 10*3/uL (ref 0.0–0.4)
Eos: 2 %
Hematocrit: 51.3 % — ABNORMAL HIGH (ref 37.5–51.0)
Hemoglobin: 17.3 g/dL (ref 13.0–17.7)
Immature Grans (Abs): 0 10*3/uL (ref 0.0–0.1)
Immature Granulocytes: 0 %
Lymphocytes Absolute: 1.8 10*3/uL (ref 0.7–3.1)
Lymphs: 32 %
MCH: 29.7 pg (ref 26.6–33.0)
MCHC: 33.7 g/dL (ref 31.5–35.7)
MCV: 88 fL (ref 79–97)
Monocytes Absolute: 0.5 10*3/uL (ref 0.1–0.9)
Monocytes: 9 %
Neutrophils Absolute: 3.3 10*3/uL (ref 1.4–7.0)
Neutrophils: 56 %
Platelets: 169 10*3/uL (ref 150–450)
RBC: 5.82 x10E6/uL — ABNORMAL HIGH (ref 4.14–5.80)
RDW: 13.1 % (ref 11.6–15.4)
WBC: 5.8 10*3/uL (ref 3.4–10.8)

## 2021-06-11 LAB — LIPID PANEL WITH LDL/HDL RATIO
Cholesterol, Total: 160 mg/dL (ref 100–199)
HDL: 41 mg/dL (ref >39)
LDL Chol Calc (NIH): 87 mg/dL (ref 0–99)
LDL/HDL Ratio: 2.1 ratio (ref 0.0–3.6)
Triglycerides: 186 mg/dL — ABNORMAL HIGH (ref 0–149)
VLDL Cholesterol Cal: 32 mg/dL (ref 5–40)

## 2021-06-11 LAB — VITAMIN B12: Vitamin B-12: 423 pg/mL (ref 232–1245)

## 2021-06-11 LAB — COMPREHENSIVE METABOLIC PANEL
ALT: 46 IU/L — ABNORMAL HIGH (ref 0–44)
AST: 38 IU/L (ref 0–40)
Albumin/Globulin Ratio: 2.2 (ref 1.2–2.2)
Albumin: 5.1 g/dL — ABNORMAL HIGH (ref 3.8–4.8)
Alkaline Phosphatase: 76 IU/L (ref 44–121)
BUN/Creatinine Ratio: 18 (ref 10–24)
BUN: 19 mg/dL (ref 8–27)
Bilirubin Total: 0.8 mg/dL (ref 0.0–1.2)
CO2: 23 mmol/L (ref 20–29)
Calcium: 10 mg/dL (ref 8.6–10.2)
Chloride: 99 mmol/L (ref 96–106)
Creatinine, Ser: 1.04 mg/dL (ref 0.76–1.27)
Globulin, Total: 2.3 g/dL (ref 1.5–4.5)
Glucose: 90 mg/dL (ref 70–99)
Potassium: 4.5 mmol/L (ref 3.5–5.2)
Sodium: 143 mmol/L (ref 134–144)
Total Protein: 7.4 g/dL (ref 6.0–8.5)
eGFR: 80 mL/min/{1.73_m2} (ref >59)

## 2021-06-11 LAB — T4, FREE: Free T4: 1.01 ng/dL (ref 0.82–1.77)

## 2021-06-11 LAB — TSH: TSH: 2.13 u[IU]/mL (ref 0.450–4.500)

## 2021-06-11 LAB — VITAMIN D 25 HYDROXY (VIT D DEFICIENCY, FRACTURES): Vit D, 25-Hydroxy: 37.9 ng/mL (ref 30.0–100.0)

## 2021-06-11 LAB — INSULIN, RANDOM: INSULIN: 18.4 u[IU]/mL (ref 2.6–24.9)

## 2021-06-11 LAB — FOLATE: Folate: 19.6 ng/mL (ref >3.0)

## 2021-06-16 ENCOUNTER — Encounter: Payer: Self-pay | Admitting: Cardiology

## 2021-06-19 NOTE — Progress Notes (Signed)
? ? ? ?Chief Complaint:  ? ?OBESITY ?Thomas [name] (MR# [id number]) is a 66 y.o. male who presents for evaluation and treatment of obesity and related comorbidities. Current Body mass index is 40.89 kg/m?Thomas [name] has been struggling with his weight for many years and has been unsuccessful in either losing weight, maintaining weight loss, or reaching his healthy weight goal. ? ?[name] is currently in the action stage of change and ready to dedicate time achieving and maintaining a healthier weight. Adib is interested in becoming our patient and working on intensive lifestyle modifications including (but not limited to) diet and exercise for weight loss. ? 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  He is married to his wife, [name], age 72, who is with him today.  He eats outside of the home 9-11 times/week.  Craves - stead and vegetables.  Snacks on candy and peanut butter.  Worst habit - "overeating".  He is here with his wife today. ? ?[name]'s habits were reviewed today and are as follows: His family eats meals together, he thinks his family will eat healthier with him, his desired weight loss is 82 pounds, he started gaining weight in the early 2000s, his heaviest weight ever was 305 pounds, he craves steak and vegetables, he skips lunch frequently, he frequently makes poor food choices, he has problems with excessive hunger, he frequently eats larger portions than normal, and he struggles with emotional eating. ? ?A1c not covered by insurance plan - was not ordered today. ? ?Depression Screen ?Marx's Food and Mood (modified PHQ-9) score was 12. ? ? ?  06/10/2021  ? 10:41 AM  ?Depression screen PHQ 2/9  ?Decreased Interest 1  ?Down, Depressed, Hopeless 1  ?PHQ - 2 Score 2  ?Altered sleeping 2  ?Tired, decreased energy 2  ?Change in appetite 1  ?Feeling bad or failure about yourself  2  ?Trouble concentrating 1  ?Moving slowly or fidgety/restless 2  ?Suicidal thoughts 0  ?PHQ-9 Score 12   ?Difficult doing work/chores Somewhat difficult  ? ?Subjective:  ? ?1. Other fatigue ?[name] reports daytime somnolence and reports waking up still tired. Patient has a history of symptoms of daytime fatigue, morning fatigue, and snoring. Thomas [name] generally gets 7 hours of sleep per night, and states that he has generally restful sleep. Snoring is present. Apneic episodes are not present. Epworth Sleepiness Score is 9.  ? ?2. Shortness of breath on exertion ?[name] notes increasing shortness of breath with exercising and seems to be worsening over time with weight gain. He notes getting out of breath sooner with activity than he used to. This has gotten worse recently. [name] denies shortness of breath at rest or orthopnea. ? ?3. Hypertension, unspecified type ?Review: taking medications as instructed, no medication side effects noted, no chest pain on exertion, no dyspnea on exertion, no swelling of ankles.  Sees Cardiology due to history of CAD.  Recently had echo on 3/23 and cardiac CT on 03/04/2021.  Sees Cardiology, Dr. Harriet Masson.  Last appointment 02/2021 - notes reviewed in Epic.  Taking valsartan-HCTZ and  ? ?BP Readings from Last 3 Encounters:  ?06/10/21 125/85  ?02/25/21 130/90  ?08/26/20 130/88  ? ?4. Mixed hyperlipidemia ?[name] has hyperlipidemia and has been trying to improve his cholesterol levels with intensive lifestyle modifications including a low saturated fat diet, exercise, and weight loss. He denies any chest pain, claudication, or myalgias.  He is taking Crestor. ? ?Lab Results  ?Component Value Date  ? ALT  46 (H) 06/10/2021  ? AST 38 06/10/2021  ? ALKPHOS 76 06/10/2021  ? BILITOT 0.8 06/10/2021  ? ?Lab Results  ?Component Value Date  ? CHOL 160 06/10/2021  ? HDL 41 06/10/2021  ? [place] 87 06/10/2021  ? TRIG 186 (H) 06/10/2021  ? CHOLHDL 3.7 02/25/2021  ? ?5. Other specified nutritional anemias ?Asymptomatic currently.  No concerns.  No OTC supplements currently. ? ?6. Vitamin D deficiency ?He is currently  taking OTC vitamin D 2000 IU each day. He denies nausea, vomiting or muscle weakness. ? ?Lab Results  ?Component Value Date  ? VD25OH 37.9 06/10/2021  ? ?7. Mood disorder ([place]) ?He was on Lexapro in the past.  Current HPQ is 12.  Management by Psychiatry.  [name], NP is his provider.  She apparently also counsels him as well.  He is taking Trintellix. ? ?8. Screening for depression ?[name] was screened for depression as part of his new patient workup today.  PHQ-9 is 12. ? ?Assessment/Plan:  ? ?Orders Placed This Encounter  ?Procedures  ? Folate  ? Comprehensive metabolic panel  ? CBC with Differential/Platelet  ? Vitamin B12  ? Insulin, random  ? Lipid Panel With LDL/HDL Ratio  ? T4, free  ? TSH  ? VITAMIN D 25 Hydroxy (Vit-D Deficiency, Fractures)  ? Folate  ? Vitamin B12  ? ? ?Medications Discontinued During This Encounter  ?Medication Reason  ? rosuvastatin (CRESTOR) 40 MG tablet   ? Multiple Vitamins-Minerals (MULTIVITAMIN WITH MINERALS) tablet   ?  ? ?No orders of the defined types were placed in this encounter. ?  ? ?1. Other fatigue ?Aaran does feel that his weight is causing his energy to be lower than it should be. Fatigue may be related to obesity, depression or many other causes. Labs will be ordered, and in the meanwhile, [name] will focus on self care including making healthy food choices, increasing physical activity and focusing on stress reduction. He will follow-up with his PCP regarding possible OSA evaluation.  Counseling done on ESS score of 9.  Check IC, Check labs. ? ?- Folate ?- Vitamin B12 ?- Insulin, random ?- T4, free ?- TSH ? ?2. Shortness of breath on exertion ?[name] does feel that he gets out of breath more easily that he used to when he exercises. [name]'s shortness of breath appears to be obesity related and exercise induced. He has agreed to work on weight loss and gradually increase exercise to treat his exercise induced shortness of breath. Will continue to monitor closely. ? ?3.  Hypertension, unspecified type ?BP at goal for age.  Medication management per PCP or specialist.  Check CMP, CBC. ? ?- Comprehensive metabolic panel ?- CBC with Differential/Platelet ? ?4. Mixed hyperlipidemia ?Cardiovascular risk and specific lipid/LDL goals reviewed.  We discussed several lifestyle modifications today and Gar will continue to work on diet, exercise and weight loss efforts. Orders and follow up as documented in patient record. Check FLP today. ? ?Counseling ?Intensive lifestyle modifications are the first line treatment for this issue. ?Dietary changes: Increase soluble fiber. Decrease simple carbohydrates. ?Exercise changes: Moderate to vigorous-intensity aerobic activity 150 minutes per week if tolerated. ?Lipid-lowering medications: see documented in medical record. ? ?- Comprehensive metabolic panel ?- Lipid Panel With LDL/HDL Ratio ? ?5. Other specified nutritional anemias ?Check folate, B12, CBC levels.  Start prudent nutritional plan and we will treat if needed. ? ?6. Vitamin D deficiency ?Low Vitamin D level contributes to fatigue and are associated with obesity, breast, and  colon cancer. We will check his vitamin D level today. ? ?- VITAMIN D 25 Hydroxy (Vit-D Deficiency, Fractures) ? ?7. Mood disorder ([place]) ?Declines Dr. Mallie Mussel referral at this time as he does not identify with any concerns of emotional eating.  He will continue medications per Psychiatry and continue counseling as well. ? ?8. Screening for depression ?Velmer had a positive depression screening. Depression is commonly associated with obesity and often results in emotional eating behaviors. We will monitor this closely and work on CBT to help improve the non-hunger eating patterns. Referral to Psychology may be required if no improvement is seen as he continues in our clinic. ? ?9. Obesity with current BMI of 41.0 ? ?Vimal is currently in the action stage of change and his goal is to continue with weight loss efforts. I  recommend Aquan begin the structured treatment plan as follows: ? ?He has agreed to the Category 3 Plan. ? ?Exercise goals:  As is.   ? ?Behavioral modification strategies: increasing lean protein intake, decreasing si

## 2021-06-24 ENCOUNTER — Ambulatory Visit (INDEPENDENT_AMBULATORY_CARE_PROVIDER_SITE_OTHER): Payer: Medicare Other | Admitting: Family Medicine

## 2021-06-24 ENCOUNTER — Encounter (INDEPENDENT_AMBULATORY_CARE_PROVIDER_SITE_OTHER): Payer: Self-pay | Admitting: Family Medicine

## 2021-06-24 VITALS — BP 133/62 | HR 62 | Temp 98.1°F | Ht 70.0 in | Wt 282.0 lb

## 2021-06-24 DIAGNOSIS — E669 Obesity, unspecified: Secondary | ICD-10-CM

## 2021-06-24 DIAGNOSIS — E66813 Obesity, class 3: Secondary | ICD-10-CM

## 2021-06-24 DIAGNOSIS — I10 Essential (primary) hypertension: Secondary | ICD-10-CM | POA: Diagnosis not present

## 2021-06-24 DIAGNOSIS — E8881 Metabolic syndrome: Secondary | ICD-10-CM | POA: Diagnosis not present

## 2021-06-24 DIAGNOSIS — R739 Hyperglycemia, unspecified: Secondary | ICD-10-CM

## 2021-06-24 DIAGNOSIS — E559 Vitamin D deficiency, unspecified: Secondary | ICD-10-CM

## 2021-06-24 DIAGNOSIS — Z6841 Body Mass Index (BMI) 40.0 and over, adult: Secondary | ICD-10-CM

## 2021-06-24 DIAGNOSIS — E88819 Insulin resistance, unspecified: Secondary | ICD-10-CM

## 2021-06-24 DIAGNOSIS — E7849 Other hyperlipidemia: Secondary | ICD-10-CM

## 2021-06-25 LAB — HEMOGLOBIN A1C
Est. average glucose Bld gHb Est-mCnc: 114 mg/dL
Hgb A1c MFr Bld: 5.6 % (ref 4.8–5.6)

## 2021-06-27 NOTE — Progress Notes (Signed)
? ? ? ?Chief Complaint:  ? ?OBESITY ?Thomas [name] is here to discuss his progress with his obesity treatment plan along with follow-up of his obesity related diagnoses. Thomas [name] is on the Category 3 Plan and states he is following his eating plan approximately 55% of the time. Thomas [name] states he is not currently exercising. ? ?Today's visit was #: 2 ?Starting weight: 285 lbs ?Starting date: 06/10/2021 ?Today's weight: 282 lbs ?Today's date: 06/24/2021 ?Total lbs lost to date: 3 ?Total lbs lost since last in-office visit: 3 ? ?Interim History: Thomas [name] is here today for his first follow-up office visit since starting the program with [place].  All blood work/ lab tests that were recently ordered by myself or an outside provider were reviewed with patient today per their request.   Extended time was spent counseling him on all new disease processes that were discovered or preexisting ones that are affected by BMI.  he understands that many of these abnormalities will need to monitored regularly along with the current treatment plan of prudent dietary changes, in which we are making each and every office visit, to improve these health parameters. Thomas [name] was eating just a yogurt for breakfast and missing several ounces of protein per day. He is having 2 oz of protein at lunch and 5 oz or so of protein at dinner, but he endorses he is not weighing his food. He made a lot of great choices and stayed within his snack calories, he just ate about 1/2 the food on the plan that needs to. ? ?Subjective:  ? ?1. Benign essential hypertension ?Thomas [name] is tolerating medication(s) well without side effects.  Medication compliance is good as patient endorses taking it as prescribed.  The patient denies additional concerns regarding this condition. I discussed CMP, TSH, and T4 with the patient today.    ? ?2. Other hyperlipidemia ?Thomas [name] sees Cardiology, Dr. Harriet Masson. He is on Crestor, and it was recently increased to 40 mg qhs. I discussed FLP with  the patient today.  ? ?3. Insulin resistance ?Thomas [name] denies a history of insulin resistance or pre-diabetes. I discussed fasting glucose and fasting insulin with the patient today.  ? ?4. Vitamin D deficiency ?Thomas [name] is currently taking OTC vitamin D 1,000 IU each day only. He denies nausea, vomiting or muscle weakness. ? ?Assessment/Plan:  ? ?Orders Placed This Encounter  ?Procedures  ? Hemoglobin A1c  ? ? ?There are no discontinued medications.  ? ?No orders of the defined types were placed in this encounter. ?  ? ?1. Benign essential hypertension ?Thomas [name]'s blood pressure is at goal. He will continue Diovan-HCT, decrease salt intake, and work on weight loss.  ? ?2. Other hyperlipidemia ?Cardiovascular risk and specific lipid/LDL goals reviewed.  We discussed several lifestyle modifications today and Thomas [name] will continue to work on diet, exercise and weight loss efforts. Orders and follow up as documented in patient record.  ? ?Counseling ?Intensive lifestyle modifications are the first line treatment for this issue. ?Dietary changes: Increase soluble fiber. Decrease simple carbohydrates. ?Exercise changes: Moderate to vigorous-intensity aerobic activity 150 minutes per week if tolerated. ?Lipid-lowering medications: see documented in medical record. ? ?3. Insulin resistance ?We will check A1c today. Handouts were given to [facility] after an extensive education was given on insulin resistance.  ? ?- Hemoglobin A1c ? ?4. Vitamin D deficiency ?Thomas [name] agreed to increase OTC Vitamin D to 2,000 IU daily, as his Vitamin D level is not at goal.  ? ?5. Obesity with current BMI  of 40.5 ?Thomas [name] is currently in the action stage of change. As such, his goal is to continue with weight loss efforts. He has agreed to the Category 3 Plan.  ? ?Thomas [name]'s wife is in the room with him today. Extensive educations was provided to both.  ? ?Exercise goals: As is. ? ?Behavioral modification strategies: increasing lean protein intake, increasing water intake,  no skipping meals, and planning for success. ? ?Thomas [name] has agreed to follow-up with our clinic in 4 weeks. He was informed of the importance of frequent follow-up visits to maximize his success with intensive lifestyle modifications for his multiple health conditions.  ? ?Objective:  ? ?Blood pressure 133/62, pulse 62, temperature 98.1 ?F (36.7 ?C), height '5\' 10"'$  (1.778 m), weight 282 lb (127.9 kg), SpO2 95 %. ?Body mass index is 40.46 kg/m?. ? ?General: Cooperative, alert, well developed, in no acute distress. ?HEENT: Conjunctivae and lids unremarkable. ?Cardiovascular: Regular rhythm.  ?Lungs: Normal work of breathing. ?Neurologic: No focal deficits.  ? ?Lab Results  ?Component Value Date  ? CREATININE 1.04 06/10/2021  ? BUN 19 06/10/2021  ? NA 143 06/10/2021  ? K 4.5 06/10/2021  ? CL 99 06/10/2021  ? CO2 23 06/10/2021  ? ?Lab Results  ?Component Value Date  ? ALT 46 (H) 06/10/2021  ? AST 38 06/10/2021  ? ALKPHOS 76 06/10/2021  ? BILITOT 0.8 06/10/2021  ? ?Lab Results  ?Component Value Date  ? HGBA1C 5.6 06/24/2021  ? ?Lab Results  ?Component Value Date  ? INSULIN 18.4 06/10/2021  ? ?Lab Results  ?Component Value Date  ? TSH 2.130 06/10/2021  ? ?Lab Results  ?Component Value Date  ? CHOL 160 06/10/2021  ? HDL 41 06/10/2021  ? [place] 87 06/10/2021  ? TRIG 186 (H) 06/10/2021  ? CHOLHDL 3.7 02/25/2021  ? ?Lab Results  ?Component Value Date  ? VD25OH 37.9 06/10/2021  ? ?Lab Results  ?Component Value Date  ? WBC 5.8 06/10/2021  ? HGB 17.3 06/10/2021  ? HCT 51.3 (H) 06/10/2021  ? MCV 88 06/10/2021  ? PLT 169 06/10/2021  ? ?No results found for: IRON, TIBC, FERRITIN ? ?Attestation Statements:  ? ?Reviewed by clinician on day of visit: allergies, medications, problem list, medical history, surgical history, family history, social history, and previous encounter notes. ? ?Time spent on visit including pre-visit chart review and post-visit care and charting was 50 minutes.  ? ? ?I, Trixie Dredge, am acting as  transcriptionist for [employer], DO. ? ?I have reviewed the above documentation for accuracy and completeness, and I agree with the above. Marjory Sneddon, D.O. ? ?The [place] was signed into law in 2016 which includes the topic of electronic health records.  This provides immediate access to information in MyChart.  This includes consultation notes, operative notes, office notes, lab results and pathology reports.  If you have any questions about what you read please let us know at your next visit so we can discuss your concerns and take corrective action if need be.  We are right here with you. ? ? ?

## 2021-07-03 ENCOUNTER — Ambulatory Visit (INDEPENDENT_AMBULATORY_CARE_PROVIDER_SITE_OTHER): Payer: Medicare Other | Admitting: Cardiology

## 2021-07-03 ENCOUNTER — Encounter: Payer: Self-pay | Admitting: Cardiology

## 2021-07-03 VITALS — BP 146/92 | HR 60 | Ht 70.5 in | Wt 283.6 lb

## 2021-07-03 DIAGNOSIS — I493 Ventricular premature depolarization: Secondary | ICD-10-CM

## 2021-07-03 DIAGNOSIS — I10 Essential (primary) hypertension: Secondary | ICD-10-CM

## 2021-07-03 DIAGNOSIS — I2510 Atherosclerotic heart disease of native coronary artery without angina pectoris: Secondary | ICD-10-CM

## 2021-07-03 DIAGNOSIS — I350 Nonrheumatic aortic (valve) stenosis: Secondary | ICD-10-CM | POA: Diagnosis not present

## 2021-07-03 DIAGNOSIS — E782 Mixed hyperlipidemia: Secondary | ICD-10-CM

## 2021-07-03 MED ORDER — BEMPEDOIC ACID 180 MG PO TABS: 180.0000 mg | ORAL_TABLET | Freq: Every day | ORAL | 3 refills | Status: DC

## 2021-07-03 NOTE — Progress Notes (Signed)
?Cardiology Office Note:   ? ?Date:  07/04/2021  ? ?ID:  Thomas [name], DOB 1955/05/04, MRN [id number] ? ?PCP:  Josetta Huddle, MD  ?Cardiologist:  Berniece Salines, DO  ?Electrophysiologist:  None  ? ?Referring MD: Josetta Huddle, MD  ? ?" I am doing well" ? ?History of Present Illness:   ? ?Thomas [name] is a 66 y.o. male with a hx of coronary artery disease-hypertension, hyperlipidemia, obesity, mild to moderate aortic stenosis, presents today for follow-up.  During his last visit he was status post coronary CTA at that time we discussed the results from his CTA and his echo.   ?  ?I saw the patient in March 2021 at that time he appeared to be doing well from a cardiovascular standpoint.  He is here today for follow-up visit.  He still appears to be happy.  He still living at home.  He offers no complaints at this time.  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 ?  ?I saw the patient on May 21, 2020 at that time he appeared to be doing well from a cardiovascular standpoint.  We continued the patient on his metoprolol 12.5 mg twice daily as well as his valsartan hydrochlorothiazide.  And he was good for a year follow-up. ?  ?I saw the patient on June 27, 2019 at that time he was status post an office visit with my partner Dr. Agustin Cree at which time a ZIO monitor was placed on the patient, he was noted to have frequent PVCs his metoprolol was increased to 25 mg daily.  He did not tolerate the metoprolol therefore we will stop this medication and started patient on Cardizem.  Fatigue got worse we stopped the Cardizem. ?  ?I saw the patient on August 26, 2020 at that time he has seen EP and medication management was recommended.There was concern for sleep apnea.  I ordered his sleep apnea study but the patient tells that he and his wife decided that he should not get the sleep study and he needed to loose weight.  ? ?I saw the patient on 02/25/2021 at that we repeated his echocardiogram to assess his aortic valve disease.  I  also suspected sleep apnea at the previous visit before that and has set the patient up for sleep study but he did tell me that his wife had asked him to lose weight before going for the sleep study.  No other complaints. ? ?He was able to get his echocardiogram result he is here today for discussing this result. ? ? ?Past Medical History:  ?Diagnosis Date  ? Anemia   ? hx of in 1992   ? Anxiety   ? Arthritis   ? lower back   ? Back pain   ? Blockage of coronary artery of heart (HCC)   ? Constipation   ? Generalized muscle weakness   ? GERD (gastroesophageal reflux disease)   ? rarely   ? Hearing loss   ? History of anxiety   ? Hyperlipemia   ? Hypertension   ? IBS (irritable bowel syndrome)   ? Joint pain   ? Lower urinary tract symptoms (LUTS)   ? Malignant neoplasm of prostate ([place]) 04/17/2014  ? Other fatigue   ? Pneumonia   ? hx x2 of pneumonia at age 68   ? Prostate cancer ([place]) 03/22/2014  ? Adenocarcinoma  ? Shortness of breath on exertion   ? Ventral hernia without obstruction or gangrene 03/27/2016  ? Vitamin D  deficiency   ? ? ?Past Surgical History:  ?Procedure Laterality Date  ? Epididymis Excision Left   ? Spermocele  ? HEMORROIDECTOMY    ? HERNIA REPAIR Bilateral 1967 and 1986  ? x2 open inguinal hernia repairs  ? INCISION AND DRAINAGE OF WOUND  2017  ? Wound Infection Post OP  ? LYMPHADENECTOMY Bilateral 05/17/2014  ? Procedure: PELVIC LYMPHADENECTOMY;  Surgeon: Raynelle Bring, MD;  Location: [facility];  Service: Urology;  Laterality: Bilateral;  ? PROSTATE BIOPSY  03/22/14  ? Raynelle Bring  ? ROBOT ASSISTED LAPAROSCOPIC RADICAL PROSTATECTOMY N/A 05/17/2014  ? Procedure: ROBOTIC ASSISTED LAPAROSCOPIC RADICAL PROSTATECTOMY LEVEL 2;  Surgeon: Raynelle Bring, MD;  Location: [facility];  Service: Urology;  Laterality: N/A;  ? SKIN TAG REMOVAL    ? 15 lesions  ? VASECTOMY    ? VENTRAL HERNIA REPAIR N/A 03/27/2016  ? Procedure: LAPAROSCOPIC VENTRAL HERNIA;  Surgeon: Alphonsa Overall, MD;  Location: [facility];  Service: General;   Laterality: N/A;  WITH MESH  ? ? ?Current Medications: ?Current Meds  ?Medication Sig  ? aspirin EC 81 MG tablet Take 81 mg by mouth daily. Swallow whole.  ? Bempedoic Acid 180 MG TABS Take 180 mg by mouth daily.  ? Cholecalciferol (VITAMIN D) 2000 units tablet Take 1,000 Units by mouth daily.  ? fluticasone (FLONASE) 50 MCG/ACT nasal spray Place 2 sprays into both nostrils daily as needed for allergies or rhinitis.  ? rosuvastatin (CRESTOR) 20 MG tablet Take 40 mg by mouth daily.  ? TRINTELLIX 20 MG TABS tablet Take 20 mg by mouth daily.  ? valsartan-hydrochlorothiazide (DIOVAN-HCT) 320-25 MG per tablet Take 1 tablet by mouth every morning.   ?  ? ?Allergies:   Amoxicillin-pot clavulanate and Topiramate  ? ?Social History  ? ?Socioeconomic History  ? Marital status: Married  ?  Spouse name: [name]  ? Number of children: 2  ? Years of education: Not on file  ? Highest education level: Not on file  ?Occupational History  ? Occupation: Retired  ?Tobacco Use  ? Smoking status: Never  ? Smokeless tobacco: Never  ?Vaping Use  ? Vaping Use: Never used  ?Substance and Sexual Activity  ? Alcohol use: No  ?  Alcohol/week: 0.0 standard drinks  ? Drug use: No  ? Sexual activity: Yes  ?  Partners: Male  ?Other Topics Concern  ? Not on file  ?Social History Narrative  ? Not on file  ? ?Social Determinants of Health  ? ?Financial Resource Strain: Not on file  ?Food Insecurity: Not on file  ?Transportation Needs: Not on file  ?Physical Activity: Not on file  ?Stress: Not on file  ?Social Connections: Not on file  ?  ? ?Family History: ?The patient's family history includes Alcohol abuse in his mother and another family member; Anxiety disorder in his mother and another family member; Bipolar disorder in his mother and another family member; COPD in his mother; Cancer in his father and mother; Depression in his mother and another family member; Drug abuse in his father and another family member; Eating disorder in his mother and  another family member; Heart attack in his mother and paternal grandfather; Heart disease in his mother and another family member; Heart failure in his mother; Hyperlipidemia in his mother; Hypertension in his mother; Kidney disease in his mother and another family member; Ovarian cancer in his mother. ? ?ROS:   ?Review of Systems  ?Constitution: Negative for decreased appetite, fever and weight gain.  ?HENT:  Negative for congestion, ear discharge, hoarse voice and sore throat.   ?Eyes: Negative for discharge, redness, vision loss in right eye and visual halos.  ?Cardiovascular: Negative for chest pain, dyspnea on exertion, leg swelling, orthopnea and palpitations.  ?Respiratory: Negative for cough, hemoptysis, shortness of breath and snoring.   ?Endocrine: Negative for heat intolerance and polyphagia.  ?Hematologic/Lymphatic: Negative for bleeding problem. Does not bruise/bleed easily.  ?Skin: Negative for flushing, nail changes, rash and suspicious lesions.  ?Musculoskeletal: Negative for arthritis, joint pain, muscle cramps, myalgias, neck pain and stiffness.  ?Gastrointestinal: Negative for abdominal pain, bowel incontinence, diarrhea and excessive appetite.  ?Genitourinary: Negative for decreased libido, genital sores and incomplete emptying.  ?Neurological: Negative for brief paralysis, focal weakness, headaches and loss of balance.  ?Psychiatric/Behavioral: Negative for altered mental status, depression and suicidal ideas.  ?Allergic/Immunologic: Negative for HIV exposure and persistent infections.  ? ? ?EKGs/Labs/Other Studies Reviewed:   ? ?The following studies were reviewed today: ? ? ?EKG:  None today  ? ?TTE 05/28/2021 ?IMPRESSIONS  ? ? ? 1. Left ventricular ejection fraction, by estimation, is 65 to 70%. Left  ?ventricular ejection fraction by PLAX is 70 %. The left ventricle has  ?normal function. The left ventricle has no regional wall motion  ?abnormalities. There is moderate concentric  ?left  ventricular hypertrophy. Left ventricular diastolic parameters are  ?consistent with Grade I diastolic dysfunction (impaired relaxation).  ? 2. Right ventricular systolic function is normal. The right ventricu

## 2021-07-03 NOTE — Patient Instructions (Signed)
Medication Instructions:  ?Your physician has recommended you make the following change in your medication:  ?STOP: Ezetimibe (Zetia) ?START: Coenzyme Q10 daily (over the counter)  ?START: Bempedoic acid 180 mg once daily ? ?*If you need a refill on your cardiac medications before your next appointment, please call your pharmacy* ? ? ?Lab Work: ?None ?If you have labs (blood work) drawn today and your tests are completely normal, you will receive your results only by: ?MyChart Message (if you have MyChart) OR ?A paper copy in the mail ?If you have any lab test that is abnormal or we need to change your treatment, we will call you to review the results. ? ? ?Testing/Procedures: ?Your physician has requested that you have an echocardiogram. Echocardiography is a painless test that uses sound waves to create images of your heart. It provides your doctor with information about the size and shape of your heart and how well your heart?s chambers and valves are working. This procedure takes approximately one hour. There are no restrictions for this procedure. ? ? ? ?Follow-Up: ?At [facility], you and your health needs are our priority.  As part of our continuing mission to provide you with exceptional heart care, we have created designated Provider Care Teams.  These Care Teams include your primary Cardiologist (physician) and Advanced Practice Providers (APPs -  Physician Assistants and Nurse Practitioners) who all work together to provide you with the care you need, when you need it. ? ?We recommend signing up for the patient portal called "MyChart".  Sign up information is provided on this After Visit Summary.  MyChart is used to connect with patients for Virtual Visits (Telemedicine).  Patients are able to view lab/test results, encounter notes, upcoming appointments, etc.  Non-urgent messages can be sent to your provider as well.   ?To learn more about what you can do with MyChart, go to [email or web address].    ? ?Your next appointment:   ?9 month(s) ? ?The format for your next appointment:   ?In Person ? ?Provider:   ?Berniece Salines, DO   ? ? ?Other Instructions ? ? ?Important Information About Sugar ? ? ? ? ?  ?

## 2021-07-04 DIAGNOSIS — I350 Nonrheumatic aortic (valve) stenosis: Secondary | ICD-10-CM | POA: Insufficient documentation

## 2021-07-09 ENCOUNTER — Ambulatory Visit (INDEPENDENT_AMBULATORY_CARE_PROVIDER_SITE_OTHER): Payer: Medicare Other | Admitting: Family Medicine

## 2021-07-28 ENCOUNTER — Encounter (INDEPENDENT_AMBULATORY_CARE_PROVIDER_SITE_OTHER): Payer: Self-pay | Admitting: Family Medicine

## 2021-07-28 ENCOUNTER — Ambulatory Visit (INDEPENDENT_AMBULATORY_CARE_PROVIDER_SITE_OTHER): Payer: Medicare Other | Admitting: Family Medicine

## 2021-07-28 VITALS — BP 129/82 | HR 63 | Temp 98.0°F | Ht 70.0 in | Wt 277.0 lb

## 2021-07-28 DIAGNOSIS — Z6839 Body mass index (BMI) 39.0-39.9, adult: Secondary | ICD-10-CM

## 2021-07-28 DIAGNOSIS — E8881 Metabolic syndrome: Secondary | ICD-10-CM

## 2021-07-28 DIAGNOSIS — I10 Essential (primary) hypertension: Secondary | ICD-10-CM

## 2021-07-28 DIAGNOSIS — F39 Unspecified mood [affective] disorder: Secondary | ICD-10-CM

## 2021-07-28 DIAGNOSIS — I2510 Atherosclerotic heart disease of native coronary artery without angina pectoris: Secondary | ICD-10-CM | POA: Diagnosis not present

## 2021-07-28 DIAGNOSIS — E559 Vitamin D deficiency, unspecified: Secondary | ICD-10-CM | POA: Diagnosis not present

## 2021-07-28 DIAGNOSIS — E669 Obesity, unspecified: Secondary | ICD-10-CM

## 2021-07-28 MED ORDER — VITAMIN D 50 MCG (2000 UT) PO TABS: 2000.0000 [IU] | ORAL_TABLET | Freq: Every day | ORAL | Status: DC

## 2021-08-10 NOTE — Progress Notes (Unsigned)
Chief Complaint:   OBESITY Thomas [name] is here to discuss his progress with his obesity treatment plan along with follow-up of his obesity related diagnoses. Thomas [name] is on the Category 3 Plan and states he is following his eating plan approximately 40% of the time. Thomas [name] states he is not currently exercising.  Today's visit was #: 3 Starting weight: 285 lbs Starting date: 06/10/2021 Today's weight: 277 lbs Today's date: 07/28/2021 Total lbs lost to date: 8 lbs Total lbs lost since last in-office visit: 5 lbs  Interim History: Thomas [name] is not packing meals for work as well as he'd like to, he is skipping meals! He is getting in his snack calories, but skipping on the protein and foods on the plan.  Subjective:   1. Insulin resistance Thomas [name] denies hunger or cravings. He can eat all the foods/meals on the plan. He is not currently taking any medicationw.  2. Essential hypertension Thomas [name] has been taking OTC Vitamin D 2,000 units daily   3. Vitamin D deficiency Thomas [name] is taking Diovan - HCT with good compliance. He has no issues or concerns.  4. Mood disorder (Thomas [name]) Thomas [name] discontinued taking Trintellix and recently started on Vilazodone/Viibryd and is tolerating it well. Thomas [name] states that he is not sure if it is working well or not yet.  Assessment/Plan:  No orders of the defined types were placed in this encounter.   Medications Discontinued During This Encounter  Medication Reason   Cholecalciferol (VITAMIN D) 2000 units tablet Reorder     Meds ordered this encounter  Medications   Cholecalciferol (VITAMIN D) 50 MCG (2000 UT) tablet    Sig: Take 1 tablet (2,000 Units total) by mouth daily.     1. Insulin resistance Thomas [name] will continue to work on weight loss, exercise, and decreasing simple carbohydrates to help decrease the risk of diabetes. Thomas [name] agreed to follow-up with [place] as directed to closely monitor his progress.  2. Essential hypertension Thomas [name]'s blood pressure is at goal; he will  continue medications per his PCP. He will continue with his prudent nutrition plan and weight loss.  3. Vitamin D deficiency Low Vitamin D level contributes to fatigue and are associated with obesity, breast, and colon cancer. He agrees to continue to take OTC Vitamin D '@2'$ ,000 IU daily and will follow-up for routine testing of Vitamin D, at least 2-3 times per year to avoid over-replacement.  - Cholecalciferol (VITAMIN D) 50 MCG (2000 UT) tablet; Take 1 tablet (2,000 Units total) by mouth daily.  4. Mood disorder (Thomas [name]) Thomas [name] will follow up with his PCP/Specialist for medication management of mood. We discussed how his prudent nutritional plan, increasing activity, and good sleep hygiene can help his mood as well.  5. Obesity, Current BMI 39.7 Thomas [name] is currently in the action stage of change. As such, his goal is to continue with weight loss efforts. He has agreed to the Category 3 Plan.   Thomas [name] will start meal prep and planning. Discussed with Thomas [name] Interventional eating and improved self-care habits, also discussed with his wife during the visit.  Exercise goals: Thomas [name] will start walking some, about 10-15 minutes.  Behavioral modification strategies: no skipping meals, meal planning and cooking strategies, and avoiding temptations.  Thomas [name] has agreed to follow-up with our clinic in 3 weeks. He was informed of the importance of frequent follow-up visits to maximize his success with intensive lifestyle modifications for his multiple health conditions.   Objective:   Blood pressure 129/82, pulse 63, temperature 98 F (  36.7 C), height '5\' 10"'$  (1.778 m), weight 277 lb (125.6 kg), SpO2 97 %. Body mass index is 39.75 kg/m.  General: Cooperative, alert, well developed, in no acute distress. HEENT: Conjunctivae and lids unremarkable. Cardiovascular: Regular rhythm.  Lungs: Normal work of breathing. Neurologic: No focal deficits.   Lab Results  Component Value Date   CREATININE 1.04 06/10/2021    BUN 19 06/10/2021   NA 143 06/10/2021   K 4.5 06/10/2021   CL 99 06/10/2021   CO2 23 06/10/2021   Lab Results  Component Value Date   ALT 46 (H) 06/10/2021   AST 38 06/10/2021   ALKPHOS 76 06/10/2021   BILITOT 0.8 06/10/2021   Lab Results  Component Value Date   HGBA1C 5.6 06/24/2021   Lab Results  Component Value Date   INSULIN 18.4 06/10/2021   Lab Results  Component Value Date   TSH 2.130 06/10/2021   Lab Results  Component Value Date   CHOL 160 06/10/2021   HDL 41 06/10/2021   LDLCALC 87 06/10/2021   TRIG 186 (H) 06/10/2021   CHOLHDL 3.7 02/25/2021   Lab Results  Component Value Date   VD25OH 37.9 06/10/2021   Lab Results  Component Value Date   WBC 5.8 06/10/2021   HGB 17.3 06/10/2021   HCT 51.3 (H) 06/10/2021   MCV 88 06/10/2021   PLT 169 06/10/2021   No results found for: IRON, TIBC, FERRITIN  Obesity Behavioral Intervention:   Approximately 15 minutes were spent on the discussion below.  ASK: We discussed the diagnosis of obesity with Thomas [name] today and Thomas [name] agreed to give [place] permission to discuss obesity behavioral modification therapy today.  ASSESS: Thomas [name] has the diagnosis of obesity and his BMI today is 39.7. Thomas [name] is in the action stage of change.   ADVISE: Thomas [name] was educated on the multiple health risks of obesity as well as the benefit of weight loss to improve his health. He was advised of the need for long term treatment and the importance of lifestyle modifications to improve his current health and to decrease his risk of future health problems.  AGREE: Multiple dietary modification options and treatment options were discussed and Thomas [name] agreed to follow the recommendations documented in the above note.  ARRANGE: Thomas [name] was educated on the importance of frequent visits to treat obesity as outlined per CMS and USPSTF guidelines and agreed to schedule his next follow up appointment today.  Attestation Statements:   Reviewed by clinician on day  of visit: allergies, medications, problem list, medical history, surgical history, family history, social history, and previous encounter notes.    ILennette Bihari, CMA, am acting as transcriptionist for Dr. Raliegh Scarlet, DO.  I have reviewed the above documentation for accuracy and completeness, and I agree with the above. Marjory Sneddon, D.O.  The [place] was signed into law in 2016 which includes the topic of electronic health records.  This provides immediate access to information in MyChart.  This includes consultation notes, operative notes, office notes, lab results and pathology reports.  If you have any questions about what you read please let us know at your next visit so we can discuss your concerns and take corrective action if need be.  We are right here with you.

## 2021-08-14 ENCOUNTER — Encounter (INDEPENDENT_AMBULATORY_CARE_PROVIDER_SITE_OTHER): Payer: Self-pay | Admitting: Family Medicine

## 2021-08-14 ENCOUNTER — Ambulatory Visit (INDEPENDENT_AMBULATORY_CARE_PROVIDER_SITE_OTHER): Payer: Medicare Other | Admitting: Family Medicine

## 2021-08-14 VITALS — BP 139/85 | HR 66 | Temp 98.0°F | Ht 70.0 in | Wt 277.0 lb

## 2021-08-14 DIAGNOSIS — E669 Obesity, unspecified: Secondary | ICD-10-CM | POA: Diagnosis not present

## 2021-08-14 DIAGNOSIS — E559 Vitamin D deficiency, unspecified: Secondary | ICD-10-CM | POA: Diagnosis not present

## 2021-08-14 DIAGNOSIS — F39 Unspecified mood [affective] disorder: Secondary | ICD-10-CM

## 2021-08-14 DIAGNOSIS — Z6839 Body mass index (BMI) 39.0-39.9, adult: Secondary | ICD-10-CM

## 2021-08-14 DIAGNOSIS — I10 Essential (primary) hypertension: Secondary | ICD-10-CM | POA: Diagnosis not present

## 2021-08-22 NOTE — Progress Notes (Unsigned)
Chief Complaint:   OBESITY Thomas [name] is here to discuss his progress with his obesity treatment plan along with follow-up of his obesity related diagnoses. Thomas [name] is on the Category 3 Plan and states he is following his eating plan approximately 40% of the time. Thomas [name] states he is walking at work but no consistent exercise.  Today's visit was #: 4 Starting weight: 285 lbs Starting date: 06/10/2021 Today's weight: 277 lbs Today's date: 08/14/2021 Total lbs lost to date: 8 Total lbs lost since last in-office visit: 0  Interim History: Thomas [name] has not been eating on plan and has missed 3 meals, mostly lunch because he is too busy at work or just doesn't make it a priority. Also, pt denies having food at home that's on plan, thus he is eating out more.  Subjective:   1. Essential hypertension Pt is asymptomatic and he is without concerns. Medication: Diovan-HCT  2. Mood disorder ([place]) Thomas [name]'s psychiatry team changed his Trintellix to Vilazodone. This is the only change pt has had recently and now he is experiencing loose stools.  3. Vitamin D deficiency Pt endorses taking OTC Vit D3 2,000 IU daily and is without concerns today.  Assessment/Plan:  No orders of the defined types were placed in this encounter.   There are no discontinued medications.   No orders of the defined types were placed in this encounter.    1. Essential hypertension BP at goal. Thomas [name] is working on healthy weight loss and exercise to improve blood pressure control. We will watch for signs of hypotension as he continues his lifestyle modifications.  2. Mood disorder ([facility]) Loose stools are not likely due to meal plan and is very likely due to pt's new medication. Pt will contact psychiatry about this side effect. Increase water intake and follow meal plan.  3. Vitamin D deficiency Low Vitamin D level contributes to fatigue and are associated with obesity, breast, and colon cancer. He agrees to continue to take OTC  Vitamin D3 2,000 IU daily and will follow-up for routine testing of Vitamin D, at least 2-3 times per year to avoid over-replacement.  4. Obesity, Current BMI 39.9 Thomas [name] is currently in the action stage of change. As such, his goal is to continue with weight loss efforts. He has agreed to the Category 3 Plan.   Pt's goals: To walk 5-10 minutes daily Meal prep and plan Self-care habits encouraged  Exercise goals: All adults should avoid inactivity. Some physical activity is better than none, and adults who participate in any amount of physical activity gain some health benefits.  Behavioral modification strategies: no skipping meals, meal planning and cooking strategies, and planning for success.  Thomas [name] has agreed to follow-up with our clinic in 3-4 weeks. He was informed of the importance of frequent follow-up visits to maximize his success with intensive lifestyle modifications for his multiple health conditions.   Objective:   Blood pressure 139/85, pulse 66, temperature 98 F (36.7 C), height '5\' 10"'$  (1.778 m), weight 277 lb (125.6 kg), SpO2 95 %. Body mass index is 39.75 kg/m.  General: Cooperative, alert, well developed, in no acute distress. HEENT: Conjunctivae and lids unremarkable. Cardiovascular: Regular rhythm.  Lungs: Normal work of breathing. Neurologic: No focal deficits.   Lab Results  Component Value Date   CREATININE 1.04 06/10/2021   BUN 19 06/10/2021   NA 143 06/10/2021   K 4.5 06/10/2021   CL 99 06/10/2021   CO2 23 06/10/2021   Lab Results  Component Value Date   ALT 46 (H) 06/10/2021   AST 38 06/10/2021   ALKPHOS 76 06/10/2021   BILITOT 0.8 06/10/2021   Lab Results  Component Value Date   HGBA1C 5.6 06/24/2021   Lab Results  Component Value Date   INSULIN 18.4 06/10/2021   Lab Results  Component Value Date   TSH 2.130 06/10/2021   Lab Results  Component Value Date   CHOL 160 06/10/2021   HDL 41 06/10/2021   LDLCALC 87 06/10/2021   TRIG  186 (H) 06/10/2021   CHOLHDL 3.7 02/25/2021   Lab Results  Component Value Date   VD25OH 37.9 06/10/2021   Lab Results  Component Value Date   WBC 5.8 06/10/2021   HGB 17.3 06/10/2021   HCT 51.3 (H) 06/10/2021   MCV 88 06/10/2021   PLT 169 06/10/2021   No results found for: "IRON", "TIBC", "FERRITIN"  Obesity Behavioral Intervention:   Approximately 15 minutes were spent on the discussion below.  ASK: We discussed the diagnosis of obesity with Thomas [name] today and Thomas [name] agreed to give [place] permission to discuss obesity behavioral modification therapy today.  ASSESS: Thomas [name] has the diagnosis of obesity and his BMI today is 39.9. Thomas [name] is in the action stage of change.   ADVISE: Thomas [name] was educated on the multiple health risks of obesity as well as the benefit of weight loss to improve his health. He was advised of the need for long term treatment and the importance of lifestyle modifications to improve his current health and to decrease his risk of future health problems.  AGREE: Multiple dietary modification options and treatment options were discussed and Thomas [name] agreed to follow the recommendations documented in the above note.  ARRANGE: Thomas [name] was educated on the importance of frequent visits to treat obesity as outlined per CMS and USPSTF guidelines and agreed to schedule his next follow up appointment today.  Attestation Statements:   Reviewed by clinician on day of visit: allergies, medications, problem list, medical history, surgical history, family history, social history, and previous encounter notes.  I, Kathlene November, BS, CMA, am acting as transcriptionist for [employer], DO.  I have reviewed the above documentation for accuracy and completeness, and I agree with the above. Marjory Sneddon, D.O.  The [place] was signed into law in 2016 which includes the topic of electronic health records.  This provides immediate access to information in MyChart.  This  includes consultation notes, operative notes, office notes, lab results and pathology reports.  If you have any questions about what you read please let us know at your next visit so we can discuss your concerns and take corrective action if need be.  We are right here with you.

## 2021-09-09 ENCOUNTER — Ambulatory Visit (INDEPENDENT_AMBULATORY_CARE_PROVIDER_SITE_OTHER): Payer: Medicare Other | Admitting: Family Medicine

## 2021-09-09 ENCOUNTER — Encounter (INDEPENDENT_AMBULATORY_CARE_PROVIDER_SITE_OTHER): Payer: Self-pay | Admitting: Family Medicine

## 2021-09-09 VITALS — BP 144/88 | HR 76 | Temp 97.7°F | Ht 70.0 in | Wt 277.0 lb

## 2021-09-09 DIAGNOSIS — E669 Obesity, unspecified: Secondary | ICD-10-CM

## 2021-09-09 DIAGNOSIS — Z6839 Body mass index (BMI) 39.0-39.9, adult: Secondary | ICD-10-CM

## 2021-09-09 DIAGNOSIS — I10 Essential (primary) hypertension: Secondary | ICD-10-CM

## 2021-09-10 NOTE — Progress Notes (Unsigned)
Chief Complaint:   OBESITY Thomas [name] is here to discuss his progress with his obesity treatment plan along with follow-up of his obesity related diagnoses. Thomas [name] is on the Category 3 Plan and states he is following his eating plan approximately 50% of the time. Thomas [name] states he is walking 10 minutes 4-5 times per week.  Today's visit was #: 5 Starting weight: 285 lbs Starting date: 06/10/2021 Today's weight: 277 lbs Today's date: 09/09/2021 Total lbs lost to date: 8 lbs Total lbs lost since last in-office visit: 0  Interim History: Thomas [name] is here today alone without his wife. He is bummed that he did not lose weight.  Thomas [name] reported that every 3rd day.  he has a sugary drink only.  He has been following meal plan only 50% of the time.  He has 3 meals with veggies only and has skipped 4 meals.  Thomas [name] walked 2-3 times each week for about 10-15 minutes.    Subjective:   1. Essential hypertension Thomas [name] came from a counseling session appointment with his wife earlier today.  Assessment/Plan:  No orders of the defined types were placed in this encounter.   Medications Discontinued During This Encounter  Medication Reason   valsartan-hydrochlorothiazide (DIOVAN-HCT) 320-25 MG per tablet      No orders of the defined types were placed in this encounter.    1. Essential hypertension Blood pressure is above goal today.  Continue home blood pressure monitoring.  2. Obesity, Current BMI 39.7 Thomas [name] is currently in the action stage of change. As such, his goal is to continue with weight loss efforts. He has agreed to the Category 3 Plan.   Exercise goals:  As is.  Behavioral modification strategies: decreasing simple carbohydrates.  Thomas [name] has agreed to follow-up with our clinic in 4 weeks. He was informed of the importance of frequent follow-up visits to maximize his success with intensive lifestyle modifications for his multiple health conditions.   Objective:   Blood pressure (!) 144/88,  pulse 76, temperature 97.7 F (36.5 C), height '5\' 10"'$  (1.778 m), weight 277 lb (125.6 kg), SpO2 99 %. Body mass index is 39.75 kg/m.  General: Cooperative, alert, well developed, in no acute distress. HEENT: Conjunctivae and lids unremarkable. Cardiovascular: Regular rhythm.  Lungs: Normal work of breathing. Neurologic: No focal deficits.   Lab Results  Component Value Date   CREATININE 1.04 06/10/2021   BUN 19 06/10/2021   NA 143 06/10/2021   K 4.5 06/10/2021   CL 99 06/10/2021   CO2 23 06/10/2021   Lab Results  Component Value Date   ALT 46 (H) 06/10/2021   AST 38 06/10/2021   ALKPHOS 76 06/10/2021   BILITOT 0.8 06/10/2021   Lab Results  Component Value Date   HGBA1C 5.6 06/24/2021   Lab Results  Component Value Date   INSULIN 18.4 06/10/2021   Lab Results  Component Value Date   TSH 2.130 06/10/2021   Lab Results  Component Value Date   CHOL 160 06/10/2021   HDL 41 06/10/2021   LDLCALC 87 06/10/2021   TRIG 186 (H) 06/10/2021   CHOLHDL 3.7 02/25/2021   Lab Results  Component Value Date   VD25OH 37.9 06/10/2021   Lab Results  Component Value Date   WBC 5.8 06/10/2021   HGB 17.3 06/10/2021   HCT 51.3 (H) 06/10/2021   MCV 88 06/10/2021   PLT 169 06/10/2021   No results found for: "IRON", "TIBC", "FERRITIN"  Obesity Behavioral Intervention:  Approximately 15 minutes were spent on the discussion below.  ASK: We discussed the diagnosis of obesity with Thomas [name] today and Thomas [name] agreed to give [place] permission to discuss obesity behavioral modification therapy today.  ASSESS: Thomas [name] has the diagnosis of obesity and his BMI today is 39.7. Thomas [name] is in the action stage of change.   ADVISE: Thomas [name] was educated on the multiple health risks of obesity as well as the benefit of weight loss to improve his health. He was advised of the need for long term treatment and the importance of lifestyle modifications to improve his current health and to decrease his risk of future  health problems.  AGREE: Multiple dietary modification options and treatment options were discussed and Thomas [name] agreed to follow the recommendations documented in the above note.  ARRANGE: Thomas [name] was educated on the importance of frequent visits to treat obesity as outlined per CMS and USPSTF guidelines and agreed to schedule his next follow up appointment today.  Attestation Statements:   Reviewed by clinician on day of visit: allergies, medications, problem list, medical history, surgical history, family history, social history, and previous encounter notes.  I, Davy Pique, RMA, am acting as Location manager for [employer], DO.  I have reviewed the above documentation for accuracy and completeness, and I agree with the above. -   I have reviewed the above documentation for accuracy and completeness, and I agree with the above. Marjory Sneddon, D.O.  The [place] was signed into law in 2016 which includes the topic of electronic health records.  This provides immediate access to information in MyChart.  This includes consultation notes, operative notes, office notes, lab results and pathology reports.  If you have any questions about what you read please let us know at your next visit so we can discuss your concerns and take corrective action if need be.  We are right here with you.

## 2021-09-12 ENCOUNTER — Encounter: Payer: Self-pay | Admitting: Adult Health

## 2021-09-12 ENCOUNTER — Ambulatory Visit (INDEPENDENT_AMBULATORY_CARE_PROVIDER_SITE_OTHER): Payer: Medicare Other | Admitting: Adult Health

## 2021-09-12 VITALS — BP 137/77 | HR 68 | Ht 70.0 in | Wt 277.0 lb

## 2021-09-12 DIAGNOSIS — F39 Unspecified mood [affective] disorder: Secondary | ICD-10-CM | POA: Diagnosis not present

## 2021-09-12 NOTE — Progress Notes (Signed)
[facility] MD/PA/NP Initial Note  09/12/2021 4:24 PM Thomas [name]  MRN:  [id number]  Chief Complaint:   HPI:  Mr. Gauthreaux is seen today for initial psychiatric evaluation.  Referred by therapist Corky Sox.  Previously followed by [name].  Describes mood today as "ok". Pleasant. Denies tearfulness. Mood symptoms - denies depression, anxiety, and irritability. Mood is consistent. Feels like current medication works well for him. Stating "I'm doing great". Currently taking Viibryd '20mg'$  daily for mood symptoms and would like to continue medication. Stable interest and motivation. Taking medications as prescribed.   Reports a history of irritability over the years that eventually threatened to ruin his marriage. Started seeing a therapist to work on managing mood symptoms along with medication and has done well. He and wife are doing well and continue to see a therapist once a month.  Energy levels stable. Active, does not have a regular exercise routine.  Enjoys some usual interests and activities. Married. Lives with wife of 49 years. 2 grown son. Spending time with family. Appetite adequate. Weight loss 305 to 277 pounds.  Sleeps well most nights. Averages 7 to 7.5 hours. Focus and concentration stable. Completing tasks. Managing aspects of household. Retired, but working.  Denies SI or HI.  Denies AH or VH. Denies substance use. Denies self harm.   Seeing therapist x 5 years - Corky Sox.    Previous medication trials: Trintelix, Lexapro, Viibryd  Visit Diagnosis:    ICD-10-CM   1. Mood disorder ([place])  F39       Past Psychiatric History: Denies psychiatric hospitalization.   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   Shortness of breath on exertion    Ventral hernia without obstruction or gangrene 03/27/2016   Vitamin D deficiency     Past Surgical History:  Procedure Laterality Date   Epididymis Excision Left    Spermocele   HEMORROIDECTOMY     HERNIA REPAIR Bilateral 1967 and 1986   x2 open inguinal hernia repairs   INCISION AND DRAINAGE OF WOUND  2017   Wound Infection Post OP   LYMPHADENECTOMY Bilateral 05/17/2014   Procedure: PELVIC LYMPHADENECTOMY;  Surgeon: Raynelle Bring, MD;  Location: [facility];  Service: Urology;  Laterality: Bilateral;   PROSTATE BIOPSY  03/22/14   Raynelle Bring   ROBOT ASSISTED LAPAROSCOPIC RADICAL PROSTATECTOMY N/A 05/17/2014   Procedure: ROBOTIC ASSISTED LAPAROSCOPIC RADICAL PROSTATECTOMY LEVEL 2;  Surgeon: Raynelle Bring, MD;  Location: [facility];  Service: Urology;  Laterality: N/A;   SKIN TAG REMOVAL     15 lesions   VASECTOMY     VENTRAL HERNIA REPAIR N/A 03/27/2016   Procedure: LAPAROSCOPIC VENTRAL HERNIA;  Surgeon: Alphonsa Overall, MD;  Location: [facility];  Service: General;  Laterality: N/A;  WITH MESH    Family Psychiatric History: Family history of mental illness - mother.   Family History:  Family History  Problem Relation Age of Onset   Eating disorder Mother    Alcohol abuse Mother    Bipolar disorder Mother    Anxiety disorder Mother    Depression Mother    Kidney disease Mother    Heart disease Mother    Hyperlipidemia Mother    Hypertension Mother    Heart attack Mother    Cancer Mother    COPD Mother    Ovarian cancer Mother    Heart failure Mother    Drug abuse Father    Cancer Father    Heart attack Paternal  Grandfather    Heart disease Other    Kidney disease Other    Depression Other    Anxiety disorder Other    Bipolar disorder Other    Alcohol abuse Other    Drug abuse Other    Eating disorder Other     Social History:  Social History   Socioeconomic History   Marital status: Married    Spouse name: [name]   Number of children: 2   Years of education: Not on file   Highest education level: Not on file  Occupational History   Occupation: Retired  Tobacco Use   Smoking status: Never   Smokeless tobacco: Never  Vaping Use   Vaping Use: Never used  Substance and Sexual Activity   Alcohol use: No    Alcohol/week: 0.0 standard drinks of alcohol   Drug use: No   Sexual activity: Yes    Partners: Male  Other Topics Concern   Not on file  Social History Narrative   Not on file   Social Determinants of Health   Financial Resource Strain: Not on file  Food Insecurity: Not on file  Transportation Needs: Not on file  Physical Activity: Not on file  Stress: Not on file  Social Connections: Not on file    Allergies:  Allergies  Allergen Reactions   Amoxicillin-Pot Clavulanate Other (See Comments)   Topiramate Other (See Comments)    Metabolic Disorder Labs: Lab Results  Component Value Date   HGBA1C 5.6 06/24/2021   No results found for: "PROLACTIN" Lab Results  Component Value Date   CHOL 160 06/10/2021   TRIG 186 (H) 06/10/2021   HDL 41 06/10/2021   CHOLHDL 3.7 02/25/2021   LDLCALC 87 06/10/2021   LDLCALC 94 02/25/2021   Lab Results  Component Value Date   TSH 2.130 06/10/2021   TSH 1.760 05/29/2020    Therapeutic Level Labs: No results found for: "LITHIUM" No results found for: "VALPROATE" No results found for: "CBMZ"  Current Medications: Current Outpatient Medications  Medication Sig Dispense Refill   aspirin EC 81 MG tablet Take 81 mg by mouth daily. Swallow whole.     Bempedoic Acid 180 MG TABS Take 180 mg by mouth daily. 90 tablet 3    Cholecalciferol (VITAMIN D) 50 MCG (2000 UT) tablet Take 1 tablet (2,000 Units total) by mouth daily.     fluticasone (FLONASE) 50 MCG/ACT nasal spray Place 2 sprays into both nostrils daily as needed for allergies or rhinitis.     rosuvastatin (CRESTOR) 20 MG tablet Take 40 mg by mouth daily.     Vilazodone HCl 20 MG TABS Take by mouth.     No current facility-administered medications for this visit.  Medication Side Effects: none  Orders placed this visit:  No orders of the defined types were placed in this encounter.   Psychiatric Specialty Exam:  Review of Systems  Musculoskeletal:  Negative for gait problem.  Neurological:  Negative for tremors.  Psychiatric/Behavioral:         Please refer to HPI    Blood pressure 137/77, pulse 68, height '5\' 10"'$  (1.778 m), weight 277 lb (125.6 kg).Body mass index is 39.75 kg/m.  General Appearance: Casual and Neat  Eye Contact:  Good  Speech:  Clear and Coherent and Normal Rate  Volume:  Normal  Mood:  Euthymic  Affect:  Appropriate and Congruent  Thought Process:  Coherent and Descriptions of Associations: Intact  Orientation:  Full (Time, Place, and Person)  Thought Content: Logical   Suicidal Thoughts:  No  Homicidal Thoughts:  No  Memory:  WNL  Judgement:  Good  Insight:  Good  Psychomotor Activity:  Normal  Concentration:  Concentration: Good and Attention Span: Good  Recall:  Good  Fund of Knowledge: Good  Language: Good  Assets:  Communication Skills Desire for Improvement Financial Resources/Insurance Housing Intimacy Leisure Time Physical Health Resilience Social Support Talents/Skills Transportation Vocational/Educational  ADL's:  Intact  Cognition: WNL  Prognosis:  Good   Screenings:  MDQ  PHQ2-9    Flowsheet Row Office Visit from 06/10/2021 in [facility]  PHQ-2 Total Score 2  PHQ-9 Total Score 12       Receiving Psychotherapy: Yes , Corky Sox  Treatment  Plan/Recommendations:   Plan:  PDMP reviewed  Continue Viibryd '20mg'$  daily - will call for refill before next appointment.  RTC 4 months  Patient advised to contact office with any questions, adverse effects, or acute worsening in signs and symptoms.   Time spent with patient was 60 minutes. Greater than 50% of face to face time with patient was spent on counseling and coordination of care.        Aloha Gell, NP

## 2021-10-01 ENCOUNTER — Telehealth: Payer: Self-pay | Admitting: Cardiology

## 2021-10-01 NOTE — Telephone Encounter (Signed)
OK 

## 2021-10-01 NOTE — Telephone Encounter (Signed)
New Message:       Patient would to switch from Dr Harriet Masson to Dr Gardiner Rhyme. Is this alright with you both?

## 2021-10-06 ENCOUNTER — Other Ambulatory Visit: Payer: Self-pay | Admitting: Internal Medicine

## 2021-10-06 ENCOUNTER — Ambulatory Visit
Admission: RE | Admit: 2021-10-06 | Discharge: 2021-10-06 | Disposition: A | Discharge status: Home / Self Care | Payer: Medicare Other | Source: Ambulatory Visit | Attending: Internal Medicine | Admitting: Internal Medicine

## 2021-10-06 DIAGNOSIS — M533 Sacrococcygeal disorders, not elsewhere classified: Secondary | ICD-10-CM

## 2021-10-07 ENCOUNTER — Ambulatory Visit (INDEPENDENT_AMBULATORY_CARE_PROVIDER_SITE_OTHER): Payer: Medicare Other | Admitting: Podiatry

## 2021-10-07 ENCOUNTER — Encounter: Payer: Self-pay | Admitting: Podiatry

## 2021-10-07 ENCOUNTER — Ambulatory Visit (INDEPENDENT_AMBULATORY_CARE_PROVIDER_SITE_OTHER): Payer: Medicare Other

## 2021-10-07 DIAGNOSIS — L600 Ingrowing nail: Secondary | ICD-10-CM | POA: Diagnosis not present

## 2021-10-07 DIAGNOSIS — M778 Other enthesopathies, not elsewhere classified: Secondary | ICD-10-CM | POA: Diagnosis not present

## 2021-10-07 DIAGNOSIS — I2510 Atherosclerotic heart disease of native coronary artery without angina pectoris: Secondary | ICD-10-CM | POA: Diagnosis not present

## 2021-10-07 MED ORDER — NEOMYCIN-POLYMYXIN-HC 1 % OT SOLN: OTIC | 1 refills | Status: DC

## 2021-10-07 NOTE — Progress Notes (Signed)
Subjective:  Patient ID: Thomas [name], male    DOB: Jul 27, 1955,  MRN: [id number] HPI Chief Complaint  Patient presents with   Toe Pain    5th toe right (medial nail corner) - hard, callus/nail x few months, very tender, changing his gait   New Patient (Initial Visit)    Est 2016    66 y.o. male presents with the above complaint.   ROS: Denies fever chills nausea vomiting muscle aches pains calf pain back pain chest pain shortness of breath.  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   Shortness of breath on exertion    Ventral hernia without obstruction or gangrene 03/27/2016   Vitamin D deficiency    Past Surgical History:  Procedure Laterality Date   Epididymis Excision Left    Spermocele   HEMORROIDECTOMY     HERNIA REPAIR Bilateral 1967 and 1986   x2 open inguinal hernia repairs   INCISION AND DRAINAGE OF WOUND  2017   Wound Infection Post OP   LYMPHADENECTOMY Bilateral 05/17/2014   Procedure: PELVIC LYMPHADENECTOMY;  Surgeon: Raynelle Bring, MD;  [facility];  Service: Urology;  Laterality: Bilateral;   PROSTATE BIOPSY  03/22/14   Raynelle Bring   ROBOT ASSISTED LAPAROSCOPIC RADICAL PROSTATECTOMY N/A 05/17/2014   Procedure: ROBOTIC ASSISTED LAPAROSCOPIC RADICAL PROSTATECTOMY LEVEL 2;  Surgeon: Raynelle Bring, MD;  [facility];  Service: Urology;  Laterality: N/A;   SKIN TAG REMOVAL     15 lesions   VASECTOMY      VENTRAL HERNIA REPAIR N/A 03/27/2016   Procedure: LAPAROSCOPIC VENTRAL HERNIA;  Surgeon: Alphonsa Overall, MD;  [facility];  Service: General;  Laterality: N/A;  WITH MESH    Current Outpatient Medications:    NEOMYCIN-POLYMYXIN-HYDROCORTISONE (CORTISPORIN) 1 % SOLN OTIC solution, Apply 1-2 drops to toe BID after soaking, Disp: 10 mL, Rfl: 1   aspirin EC 81 MG tablet, Take 81 mg by mouth daily. Swallow whole., Disp: , Rfl:    Cholecalciferol (VITAMIN D) 50 MCG (2000 UT) tablet, Take 1 tablet (2,000 Units total) by mouth daily., Disp: , Rfl:    fluticasone (FLONASE) 50 MCG/ACT nasal spray, Place 2 sprays into both nostrils daily as needed for allergies or rhinitis., Disp: , Rfl:    rosuvastatin (CRESTOR) 20 MG tablet, Take 40 mg by mouth daily., Disp: , Rfl:    Vilazodone HCl 20 MG TABS, Take by mouth., Disp: , Rfl:   Allergies  Allergen Reactions   Amoxicillin-Pot Clavulanate Other (See Comments)   Topiramate Other (See Comments)   Review of Systems Objective:  There were no vitals filed for this visit.  General: Well developed, nourished, in no acute distress, alert and oriented x3   Dermatological:  Skin is warm, dry and supple bilateral. Nails x 10 are well maintained; remaining integument appears unremarkable at this time. There are no open sores, no preulcerative lesions, no rash or signs of infection present.  Painful medial nail spicule with mild paronychia fifth toe right foot  Vascular: Dorsalis Pedis artery and Posterior Tibial artery pedal pulses are 2/4 bilateral with immedate capillary fill time. Pedal hair growth present. No varicosities and no lower extremity edema present bilateral.   Neruologic: Grossly intact via light touch bilateral. Vibratory intact via tuning fork bilateral. Protective threshold with Semmes Wienstein monofilament intact to all pedal sites bilateral. Patellar and Achilles deep tendon reflexes 2+ bilateral. No Babinski or clonus noted bilateral.    Musculoskeletal: No gross boney pedal deformities bilateral. No pain, crepitus, or limitation noted with foot and ankle range of motion bilateral. Muscular strength 5/5 in all groups tested bilateral.  Gait: Unassisted, Nonantalgic.    Radiographs:  Radiographs taken today demonstrate an osseously mature individual fifth metatarsal and fifth toe area demonstrates no osseous abnormalities other than adductovarus rotated under lapping hammertoe deformity.  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  Applied phenol 2 applications at this point for such a small area and neutralized with isopropyl alcohol Silvadene cream Telfa pad and dressed a compressive dressing.  Was provided both oral and written home-going structure for care and soaking of the toe as well as a prescription for Cortisporin Otic to be applied twice daily after soaking.  Like to follow-up with him in 2 weeks make sure he is healing well.     Rylea Selway [place], [place]

## 2021-10-07 NOTE — Patient Instructions (Signed)

## 2021-10-10 ENCOUNTER — Telehealth: Payer: Self-pay

## 2021-10-10 ENCOUNTER — Encounter: Payer: Self-pay | Admitting: Cardiology

## 2021-10-10 NOTE — Telephone Encounter (Signed)
error 

## 2021-10-13 ENCOUNTER — Ambulatory Visit (INDEPENDENT_AMBULATORY_CARE_PROVIDER_SITE_OTHER): Payer: Medicare Other | Admitting: Family Medicine

## 2021-10-15 ENCOUNTER — Telehealth: Payer: Self-pay | Admitting: Cardiology

## 2021-10-15 ENCOUNTER — Other Ambulatory Visit: Payer: Self-pay

## 2021-10-15 NOTE — Telephone Encounter (Signed)
Pt c/o medication issue:  1. Name of Medication: zetia  2. How are you currently taking this medication (dosage and times per day)? 1 tablet daily  3. Are you having a reaction (difficulty breathing--STAT)? no  4. What is your medication issue? Patient requesting a refill be sent to [facility] in [place], but it is no longer listed in current medications list. He says he is completely out of the medication.

## 2021-10-15 NOTE — Telephone Encounter (Signed)
Patient wanted refill for zetia 10 mg. I informed patient that Dr. Harriet Masson took him off zetia in April and added nexlitol '180mg'$  and coenzyme Q10. Patient has not been taking the prescribed medication. Patient is taking crestor 40 mg daily, ASA '81mg'$  daily, valsartan 320/hctz 25. Patient stated he cannot afford nexlitol and wants to go back on zetia. Appointment made with Dr. Gardiner Rhyme (changed from Dr. Harriet Masson) for 10/26. Recommended for patient to bring a list of all the medications he is taking at his next appointment. Please advise on medications.

## 2021-10-16 NOTE — Telephone Encounter (Signed)
OK to switch from Nexletol back to Zetia 10 mg daily.  Continue Crestor

## 2021-10-17 ENCOUNTER — Other Ambulatory Visit: Payer: Self-pay

## 2021-10-17 MED ORDER — EZETIMIBE 10 MG PO TABS: 10.0000 mg | ORAL_TABLET | Freq: Every day | ORAL | 3 refills | Status: DC

## 2021-10-17 NOTE — Telephone Encounter (Signed)
Called patient, advised of message from MD.  Dian Situ to pharmacy.

## 2021-10-21 ENCOUNTER — Ambulatory Visit: Payer: Medicare Other | Admitting: Podiatry

## 2021-10-22 ENCOUNTER — Encounter (INDEPENDENT_AMBULATORY_CARE_PROVIDER_SITE_OTHER): Payer: Self-pay

## 2021-10-23 ENCOUNTER — Encounter: Payer: Self-pay | Admitting: Podiatry

## 2021-10-23 ENCOUNTER — Ambulatory Visit (INDEPENDENT_AMBULATORY_CARE_PROVIDER_SITE_OTHER): Payer: Medicare Other | Admitting: Podiatry

## 2021-10-23 DIAGNOSIS — L03031 Cellulitis of right toe: Secondary | ICD-10-CM

## 2021-10-23 DIAGNOSIS — Z9889 Other specified postprocedural states: Secondary | ICD-10-CM

## 2021-10-23 DIAGNOSIS — I2510 Atherosclerotic heart disease of native coronary artery without angina pectoris: Secondary | ICD-10-CM | POA: Diagnosis not present

## 2021-10-23 DIAGNOSIS — L600 Ingrowing nail: Secondary | ICD-10-CM

## 2021-10-23 MED ORDER — CEPHALEXIN 500 MG PO CAPS: 500.0000 mg | ORAL_CAPSULE | Freq: Three times a day (TID) | ORAL | 0 refills | Status: DC

## 2021-10-23 NOTE — Progress Notes (Signed)
He presents today for follow-up of his nail procedure fifth digit of the right foot.  He states been sore for the past couple days he is really not sure why.  He states that he is stays in his shoes a lot and his feet do get wet.  Objective: Vital signs are stable alert oriented x 3 the tibial border of the fifth digit right foot does demonstrate some maceration and some mild erythema no purulence no malodor.  Assessment: Mild paronychia status post matrixectomy fifth digit right foot.  Plan: I put him on Keflex 500 mg 1 p.o. twice daily he will start soaking Epsom salts and warm water continue to do so until completely resolved.  Follow-up with me in about 3 weeks.

## 2021-11-07 ENCOUNTER — Other Ambulatory Visit: Payer: Self-pay | Admitting: Sports Medicine

## 2021-11-07 ENCOUNTER — Ambulatory Visit
Admission: RE | Admit: 2021-11-07 | Discharge: 2021-11-07 | Disposition: A | Discharge status: Home / Self Care | Payer: Medicare Other | Source: Ambulatory Visit | Attending: Sports Medicine | Admitting: Sports Medicine

## 2021-11-07 DIAGNOSIS — M545 Low back pain, unspecified: Secondary | ICD-10-CM

## 2021-11-07 DIAGNOSIS — M25551 Pain in right hip: Secondary | ICD-10-CM

## 2021-11-13 ENCOUNTER — Telehealth: Payer: Self-pay | Admitting: Adult Health

## 2021-11-13 NOTE — Telephone Encounter (Signed)
Please review

## 2021-11-13 NOTE — Telephone Encounter (Signed)
Yes, he can do that.

## 2021-11-13 NOTE — Telephone Encounter (Signed)
$'10mg'B$  x 7, then stop.

## 2021-11-13 NOTE — Telephone Encounter (Signed)
Does he have to taper off the viibryd or can he just stop?

## 2021-11-13 NOTE — Telephone Encounter (Signed)
Next appt is 01/12/22. Thomas [name] came up to the office and said that he is still having diarrhea symptoms while taking the Vilazadone 20 mg. He states he is going to [place] and [place] on 11/26/21 and is concerned about the diarrhea issue. Can he be put on another medication other than this one? He said he has spoken to you about it on last visit. New pharmacy is:  [facility] in [place], [place]. [address] [place], [place], [place] [address]. [phone].

## 2021-11-13 NOTE — Telephone Encounter (Signed)
Pt stated that he can't function with the diarrhea.He wants to stop the med.He said he has about 2 weeks of trintellix samples and wants to know if he can take that in place of viibryd while [place]

## 2021-11-14 NOTE — Telephone Encounter (Signed)
Pt informed

## 2021-12-18 ENCOUNTER — Ambulatory Visit (INDEPENDENT_AMBULATORY_CARE_PROVIDER_SITE_OTHER): Payer: Medicare Other | Admitting: Adult Health

## 2021-12-18 ENCOUNTER — Encounter: Payer: Self-pay | Admitting: Adult Health

## 2021-12-18 ENCOUNTER — Telehealth: Payer: Self-pay | Admitting: Adult Health

## 2021-12-18 DIAGNOSIS — F39 Unspecified mood [affective] disorder: Secondary | ICD-10-CM | POA: Diagnosis not present

## 2021-12-18 MED ORDER — VORTIOXETINE HBR 20 MG PO TABS: 20.0000 mg | ORAL_TABLET | Freq: Every day | ORAL | 0 refills | Status: DC

## 2021-12-18 NOTE — Addendum Note (Signed)
Addended by: Aloha Gell on: 12/18/2021 09:35 AM   Modules accepted: Orders

## 2021-12-18 NOTE — Progress Notes (Signed)
Thomas [name] [id number] May 16, 1955 66 y.o.  Subjective:   Patient ID:  Thomas [name] is a 66 y.o. (DOB 1955/04/12) male.  Chief Complaint: No chief complaint on file.   HPI Thomas [name] presents to the office today for follow-up of mood disorder.  Referred by therapist Corky Sox.  Previously followed by [name].  Describes mood today as "ok". Pleasant. Denies tearfulness. Mood symptoms - denies depression, anxiety and irritability. Mood is consistent. Feels like the Trintellix at '20mg'$  daily works well. Stating "I'm doing good". He and wife are doing well. Stable interest and motivation. Taking medications as prescribed. Working with a Transport planner. Energy levels stable. Active, does not have a regular exercise routine.  Enjoys some usual interests and activities. Married. Lives with wife of 72 years. 2 grown sons. Spending time with family. Appetite adequate. Weight loss 277 to 265 pounds.  Sleeps well most nights. Averages 7 to 7.5 hours. Focus and concentration stable. Completing tasks. Managing aspects of household. Retired, but working.  Denies SI or HI.  Denies AH or VH. Denies substance use. Denies self harm.   [place] Office Visit from 06/10/2021 in [facility]  PHQ-2 Total Score 2  PHQ-9 Total Score 12        Review of Systems:  Review of Systems  Musculoskeletal:  Negative for gait problem.  Neurological:  Negative for tremors.  Psychiatric/Behavioral:         Please refer to HPI    Medications: I have reviewed the patient's current medications.  Current Outpatient Medications  Medication Sig Dispense Refill   aspirin EC 81 MG tablet Take 81 mg by mouth daily. Swallow whole.     cephALEXin (KEFLEX) 500 MG capsule Take 1 capsule (500 mg total) by mouth 3 (three) times daily. 30 capsule 0   Cholecalciferol (VITAMIN D) 50 MCG (2000 UT) tablet Take 1 tablet (2,000 Units total) by mouth daily.     ezetimibe (ZETIA) 10  MG tablet Take 1 tablet (10 mg total) by mouth daily. 90 tablet 3   fluticasone (FLONASE) 50 MCG/ACT nasal spray Place 2 sprays into both nostrils daily as needed for allergies or rhinitis.     NEOMYCIN-POLYMYXIN-HYDROCORTISONE (CORTISPORIN) 1 % SOLN OTIC solution Apply 1-2 drops to toe BID after soaking 10 mL 1   rosuvastatin (CRESTOR) 20 MG tablet Take 40 mg by mouth daily.     valsartan-hydrochlorothiazide (DIOVAN-HCT) 320-25 MG tablet Take 1 tablet by mouth daily.     Vilazodone HCl 20 MG TABS Take by mouth.     No current facility-administered medications for this visit.    Medication Side Effects: None  Allergies:  Allergies  Allergen Reactions   Amoxicillin-Pot Clavulanate Other (See Comments)   Topiramate Other (See Comments)    Past Medical History:  Diagnosis Date   Anemia    hx of in 1992    Anxiety    Arthritis    lower back    Back pain    Blockage of coronary artery of heart (HCC)    Constipation    Generalized muscle weakness    GERD (gastroesophageal reflux disease)    rarely    Hearing loss    History of anxiety    Hyperlipemia    Hypertension    IBS (irritable bowel syndrome)    Joint pain    Lower urinary tract symptoms (LUTS)    Malignant neoplasm of prostate ([place]) 04/17/2014   Other fatigue  Pneumonia    hx x2 of pneumonia at age 38    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   Please see review of systems for further details on the patient's review from today.   Objective:   Physical Exam:  There were no vitals taken for this visit.  Physical Exam Constitutional:      General: He is not in acute distress. Musculoskeletal:        General: No deformity.  Neurological:     Mental Status:  He is alert and oriented to person, place, and time.     Coordination: Coordination normal.  Psychiatric:        Attention and Perception: Attention and perception normal. He does not perceive auditory or visual hallucinations.        Mood and Affect: Mood normal. Mood is not anxious or depressed. Affect is not labile, blunt, angry or inappropriate.        Speech: Speech normal.        Behavior: Behavior normal.        Thought Content: Thought content normal. Thought content is not paranoid or delusional. Thought content does not include homicidal or suicidal ideation. Thought content does not include homicidal or suicidal plan.        Cognition and Memory: Cognition and memory normal.        Judgment: Judgment normal.     Comments: Insight intact     Lab Review:     Component Value Date/Time   NA 143 06/10/2021 1529   K 4.5 06/10/2021 1529   CL 99 06/10/2021 1529   CO2 23 06/10/2021 1529   GLUCOSE 90 06/10/2021 1529   GLUCOSE 95 01/22/2019 1438   BUN 19 06/10/2021 1529   CREATININE 1.04 06/10/2021 1529   CALCIUM 10.0 06/10/2021 1529   PROT 7.4 06/10/2021 1529   ALBUMIN 5.1 (H) 06/10/2021 1529   AST 38 06/10/2021 1529   ALT 46 (H) 06/10/2021 1529   ALKPHOS 76 06/10/2021 1529   BILITOT 0.8 06/10/2021 1529   GFRNONAA 64 02/21/2019 0855   GFRAA 74 02/21/2019 0855       Component Value Date/Time   WBC 5.8 06/10/2021 1529   WBC 6.4 01/22/2019 1438   RBC 5.82 (H) 06/10/2021 1529   RBC 5.31 01/22/2019 1438   HGB 17.3 06/10/2021 1529   HCT 51.3 (H) 06/10/2021 1529   PLT 169 06/10/2021 1529   MCV 88 06/10/2021 1529   MCH 29.7 06/10/2021 1529   MCH 29.8 01/22/2019 1438   MCHC 33.7 06/10/2021 1529   MCHC 33.1 01/22/2019 1438   RDW 13.1 06/10/2021 1529   LYMPHSABS 1.8 06/10/2021 1529   MONOABS 0.9 05/05/2009 1341   EOSABS 0.1 06/10/2021 1529   BASOSABS 0.1 06/10/2021 1529    No results found for: "POCLITH", "LITHIUM"   No results found for: "PHENYTOIN", "PHENOBARB",  "VALPROATE", "CBMZ"   .res Assessment: Plan:    Plan:  PDMP reviewed  Continue Trintellix '20mg'$  daily  RTC 4 months  Patient advised to contact office with any questions, adverse effects, or acute worsening in signs and symptoms.  Time spent with patient was 25 minutes. Greater than 50% of face to face time with patient was spent on counseling and coordination of  care.     Diagnoses and all orders for this visit:  Mood disorder [name])     Please see After Visit Summary for patient specific instructions.  Future Appointments  Date Time Provider [place]  01/08/2022  9:30 AM Donato Heinz, MD [facility] None  03/30/2022  9:30 AM [id number]    No orders of the defined types were placed in this encounter.   -------------------------------

## 2021-12-18 NOTE — Telephone Encounter (Signed)
Paola is wanting to apply for pt assistance for his Trintellix.  Please do a PA for this medication so Bernita Buffy can see how it is covered when they check is insurance.

## 2021-12-19 NOTE — Telephone Encounter (Signed)
Noted will submit PA 

## 2021-12-25 NOTE — Telephone Encounter (Signed)
Just an update, Trintellix is already covered under his plan so NO PA required.

## 2022-01-07 NOTE — Progress Notes (Unsigned)
Cardiology Office Note:    Date:  01/08/2022   ID:  Thomas [name], DOB 1956/02/10, MRN [id number]  PCP:  Josetta Huddle, MD  Cardiologist:  Donato Heinz, MD  Electrophysiologist:  None   Referring MD: Josetta Huddle, MD   Chief Complaint  Patient presents with   Aortic Stenosis    History of Present Illness:    Thomas [name] is a 66 y.o. male with a hx of CAD, hypertension, hyperlipidemia, aortic stenosis, obesity who presents for follow-up.  Echocardiogram 01/27/2019 showed EF 55 to 60%, moderate LVH, grade 1 diastolic dysfunction, normal RV function, mild to moderate aortic stenosis.  Coronary CTA on 02/23/2019 showed nonobstructive CAD, calcium score 113 (87th percentile).  Zio patch x6 days on 06/12/2020 showed 1 episode of NSVT lasting 4 beats, frequent PVCs (6.3% of beats).  Echocardiogram 05/28/2021 showed EF 65 to 70%, moderate LVH, grade 1 diastolic dysfunction, normal RV function, severe left atrial enlargement, moderate aortic stenosis.  Since last clinic visit, reports that he has been doing well.  He is retired but still working.  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  He reports mild chest pain that can occur after working but never happens while he is exerting himself.  Reports he does recumbent bike for 45 minutes 4 days/week and denies any exertional chest pain.  Denies any dyspnea, lightheadedness, syncope, or lower extremity edema.  Reports rare palpitations that last for few seconds.  Reports he has been recording BP log for his PCP, has upcoming appointment.  He has lost almost 40 pounds.   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   Shortness of breath on exertion    Ventral hernia without obstruction or gangrene 03/27/2016   Vitamin D deficiency     Past Surgical History:  Procedure Laterality Date   Epididymis Excision Left    Spermocele   HEMORROIDECTOMY     HERNIA REPAIR Bilateral 1967 and 1986   x2 open inguinal hernia repairs   INCISION AND DRAINAGE OF WOUND  2017   Wound Infection Post OP   LYMPHADENECTOMY Bilateral 05/17/2014   Procedure: PELVIC LYMPHADENECTOMY;  Surgeon: Raynelle Bring, MD;  Location: [facility];  Service: Urology;  Laterality: Bilateral;   PROSTATE BIOPSY  03/22/14   Raynelle Bring   ROBOT ASSISTED LAPAROSCOPIC RADICAL PROSTATECTOMY N/A 05/17/2014   Procedure: ROBOTIC ASSISTED LAPAROSCOPIC RADICAL PROSTATECTOMY LEVEL 2;  Surgeon: Raynelle Bring, MD;  Location: [facility];  Service: Urology;  Laterality: N/A;   SKIN TAG REMOVAL     15 lesions   VASECTOMY     VENTRAL HERNIA REPAIR N/A 03/27/2016  Procedure: LAPAROSCOPIC VENTRAL HERNIA;  Surgeon: Alphonsa Overall, MD;  Location: [facility];  Service: General;  Laterality: N/A;  WITH MESH    Current Medications: Current Meds  Medication Sig   aspirin EC 81 MG tablet Take 81 mg by mouth daily. Swallow whole.   Cholecalciferol (VITAMIN D) 50 MCG (2000 UT) tablet Take 1 tablet (2,000 Units total) by mouth daily.   ezetimibe (ZETIA) 10 MG tablet Take 1 tablet (10 mg total) by mouth daily.   fluticasone (FLONASE) 50 MCG/ACT nasal spray Place 2 sprays into both nostrils daily as needed for allergies or rhinitis.   NEOMYCIN-POLYMYXIN-HYDROCORTISONE (CORTISPORIN) 1 % SOLN OTIC solution Apply 1-2 drops to toe BID after soaking   rosuvastatin (CRESTOR) 20 MG  tablet Take 40 mg by mouth daily.   valsartan-hydrochlorothiazide (DIOVAN-HCT) 320-25 MG tablet Take 1 tablet by mouth daily.   vortioxetine HBr (TRINTELLIX) 20 MG TABS tablet Take 1 tablet (20 mg total) by mouth daily.     Allergies:   Amoxicillin-pot clavulanate and Topiramate   Social History   Socioeconomic History   Marital status: Married    Spouse name: [name]   Number of children: 2   Years of education: Not on file   Highest education level: Not on file  Occupational History   Occupation: Retired  Tobacco Use   Smoking status: Never   Smokeless tobacco: Never  Vaping Use   Vaping Use: Never used  Substance and Sexual Activity   Alcohol use: No    Alcohol/week: 0.0 standard drinks of alcohol   Drug use: No   Sexual activity: Yes    Partners: Male  Other Topics Concern   Not on file  Social History Narrative   Not on file   Social Determinants of Health   Financial Resource Strain: Not on file  Food Insecurity: Not on file  Transportation Needs: Not on file  Physical Activity: Not on file  Stress: Not on file  Social Connections: Not on file     Family History: The patient's family history includes Alcohol abuse in his mother and another family member; Anxiety disorder in his mother and another family member; Bipolar disorder in his mother and another family member; COPD in his mother; Cancer in his father and mother; Depression in his mother and another family member; Drug abuse in his father and another family member; Eating disorder in his mother and another family member; Heart attack in his mother and paternal grandfather; Heart disease in his mother and another family member; Heart failure in his mother; Hyperlipidemia in his mother; Hypertension in his mother; Kidney disease in his mother and another family member; Ovarian cancer in his mother.  ROS:   Please see the history of present illness.     All other systems reviewed and are  negative.  EKGs/Labs/Other Studies Reviewed:    The following studies were reviewed today:   EKG:   01/08/22: Sinus rhythm, PVC, rate 64, no ST abnormalities  Recent Labs: 06/10/2021: ALT 46; BUN 19; Creatinine, Ser 1.04; Hemoglobin 17.3; Platelets 169; Potassium 4.5; Sodium 143; TSH 2.130  Recent Lipid Panel    Component Value Date/Time   CHOL 160 06/10/2021 1529   TRIG 186 (H) 06/10/2021 1529   HDL 41 06/10/2021 1529   CHOLHDL 3.7 02/25/2021 0848   LDLCALC 87 06/10/2021 1529    Physical Exam:    VS:  BP (!) 142/84   Pulse 64   Ht '5\' 11"'$  (1.803 m)   Wt 267 lb  12.8 oz (121.5 kg)   SpO2 99%   BMI 37.35 kg/m     Wt Readings from Last 3 Encounters:  01/08/22 267 lb 12.8 oz (121.5 kg)  09/09/21 277 lb (125.6 kg)  08/14/21 277 lb (125.6 kg)     GEN:  Well nourished, well developed in no acute distress HEENT: Normal NECK: No JVD; No carotid bruits LYMPHATICS: No lymphadenopathy CARDIAC: RRR, 3/6 systolic murmur RESPIRATORY:  Clear to auscultation without rales, wheezing or rhonchi  ABDOMEN: Soft, non-tender, non-distended MUSCULOSKELETAL:  No edema; No deformity  SKIN: Warm and dry NEUROLOGIC:  Alert and oriented x 3 PSYCHIATRIC:  Normal affect   ASSESSMENT:    1. CAD in native artery   2. Aortic valve stenosis, etiology of cardiac valve disease unspecified   3. Essential hypertension   4. Hyperlipidemia, unspecified hyperlipidemia type   5. Snoring    PLAN:    CAD: Coronary CTA on 02/23/2019 showed nonobstructive CAD, calcium score 113 (87th percentile).  Echocardiogram 05/28/2021 showed EF 65 to 70%, moderate LVH, grade 1 diastolic dysfunction, normal RV function, severe left atrial enlargement, moderate aortic stenosis. -Continue rosuvastatin and Zetia, check lipid panel  Aortic stenosis: Moderate on echo 05/2021.  Has repeat echo scheduled 03/2022 for monitoring  Hypertension: On valsartan-HCTZ 320-25 mg daily.  Mildly elevated in clinic today, reports he  has been monitoring home BP log and has upcoming appointment with PCP for further management  Hyperlipidemia: On rosuvastatin 20 mg daily and Zetia 10 mg daily.  LDL 87 on 06/02/2021.  Check lipid panel  Snoring: Recommend Itamar sleep study.  STOP-BANG 6  RTC in 3 months    Medication Adjustments/Labs and Tests Ordered: Current medicines are reviewed at length with the patient today.  Concerns regarding medicines are outlined above.  Orders Placed This Encounter  Procedures   Basic metabolic panel   Lipid panel   Magnesium   EKG 12-Lead   Itamar Sleep Study   No orders of the defined types were placed in this encounter.   Patient Instructions  Medication Instructions:  Your physician recommends that you continue on your current medications as directed. Please refer to the Current Medication list given to you today.  *If you need a refill on your cardiac medications before your next appointment, please call your pharmacy*   Lab Work: BMET, Mag, Lipid today  If you have labs (blood work) drawn today and your tests are completely normal, you will receive your results only by: [place] (if you have MyChart) OR A paper copy in the mail If you have any lab test that is abnormal or we need to change your treatment, we will call you to review the results.   Testing/Procedures: WatchPAT?  Is a FDA cleared portable home sleep study test that uses a watch and 3 points of contact to monitor 7 different channels, including your heart rate, oxygen saturations, body position, snoring, and chest motion.  The study is easy to use from the comfort of your own home and accurately detect sleep apnea.  Before bed, you attach the chest sensor, attached the sleep apnea bracelet to your nondominant hand, and attach the finger probe.  After the study, the raw data is downloaded from the watch and scored for apnea events.   For more information:  [email or web address]  Patient Testing Instructions:  Do not put battery into the device until bedtime when you are ready to begin the test. Please call the support number if you need assistance after  following the instructions below: 24 hour support line- [phone] or ITAMAR support at [phone] (option 2)  Download the [employer] AmericanWatchPAT One" app through the google play store or App Store  Be sure to turn on or enable access to bluetooth in [place] on your smartphone/ device  Make sure no other bluetooth devices are on and within the vicinity of your smartphone/ device and WatchPAT watch during testing.  Make sure to leave your smart phone/ device plugged in and charging all night.  When ready for bed:  Follow the instructions step by step in the WatchPAT One App to activate the testing device. For additional instructions, including video instruction, visit the WatchPAT One video on Youtube. You can search for [place] One within Youtube (video is 4 minutes and 18 seconds) or enter: https://youtube/watch?[email or web address]_vbiwxE Please note: You will be prompted to enter a Pin to connect via bluetooth when starting the test. The PIN will be assigned to you when you receive the test.  The device is disposable, but it recommended that you retain the device until you receive a call letting you know the study has been received and the results have been interpreted.  We will let you know if the study did not transmit to [place] properly after the test is completed. You do not need to call us to confirm the receipt of the test.  Please complete the test within 48 hours of receiving PIN.   Frequently Asked Questions:  What is Watch [place] one?  A single use fully disposable home sleep apnea testing device and will not need to be returned after completion.  What are the requirements to use WatchPAT one?  The be able to have a successful watchpat one sleep study, you should have your Watch pat one  device, your smart phone, watch pat one app, your PIN number and Internet access What type of phone do I need?  You should have a smart phone that uses Android 5.1 and above or any Iphone with IOS 10 and above How can I download the WatchPAT one app?  Based on your device type search for WatchPAT one app either in google play for android devices or APP store for Iphone's Where will I get my PIN for the study?  Your PIN will be provided by your physician's office. It is used for authentication and if you lose/forget your PIN, please reach out to your providers office.  I do not have Internet at home. Can I do WatchPAT one study?  WatchPAT One needs Internet connection throughout the night to be able to transmit the sleep data. You can use your home/local internet or your cellular's data package. However, it is always recommended to use home/local Internet. It is estimated that between 20MB-30MB will be used with each study.However, the application will be looking for 80MB space in the phone to start the study.  What happens if I lose internet or bluetooth connection?  During the internet disconnection, your phone will not be able to transmit the sleep data. All the data, will be stored in your phone. As soon as the internet connection is back on, the phone will being sending the sleep data. During the bluetooth disconnection, WatchPAT one will not be able to to send the sleep data to your phone. Data will be kept in the [facility] one until two devices have bluetooth connection back on. As soon as the connection is back on, WatchPAT one will send the sleep data to the phone.  How  long do I need to wear the WatchPAT one?  After you start the study, you should wear the device at least 6 hours.  How far should I keep my phone from the device?  During the night, your phone should be within 15 feet.  What happens if I leave the room for restroom or other reasons?  Leaving the room for any reason will not  cause any problem. As soon as your get back to the room, both devices will reconnect and will continue to send the sleep data. Can I use my phone during the sleep study?  Yes, you can use your phone as usual during the study. But it is recommended to put your watchpat one on when you are ready to go to bed.  How will I get my study results?  A soon as you completed your study, your sleep data will be sent to the provider. They will then share the results with you when they are ready.   Follow-Up: At [facility], you and your health needs are our priority.  As part of our continuing mission to provide you with exceptional heart care, we have created designated Provider Care Teams.  These Care Teams include your primary Cardiologist (physician) and Advanced Practice Providers (APPs -  Physician Assistants and Nurse Practitioners) who all work together to provide you with the care you need, when you need it.  We recommend signing up for the patient portal called "MyChart".  Sign up information is provided on this After Visit Summary.  MyChart is used to connect with patients for Virtual Visits (Telemedicine).  Patients are able to view lab/test results, encounter notes, upcoming appointments, etc.  Non-urgent messages can be sent to your provider as well.   To learn more about what you can do with MyChart, go to [email or web address].    Your next appointment:   January after echo with Dr. Gardiner Rhyme            Signed, Donato Heinz, MD  01/08/2022 12:57 PM    [place]

## 2022-01-08 ENCOUNTER — Telehealth: Payer: Self-pay | Admitting: *Deleted

## 2022-01-08 ENCOUNTER — Encounter: Payer: Self-pay | Admitting: Cardiology

## 2022-01-08 ENCOUNTER — Ambulatory Visit: Payer: Medicare Other | Attending: Cardiology | Admitting: Cardiology

## 2022-01-08 ENCOUNTER — Telehealth: Payer: Self-pay

## 2022-01-08 VITALS — BP 142/84 | HR 64 | Ht 71.0 in | Wt 267.8 lb

## 2022-01-08 DIAGNOSIS — I350 Nonrheumatic aortic (valve) stenosis: Secondary | ICD-10-CM

## 2022-01-08 DIAGNOSIS — I2510 Atherosclerotic heart disease of native coronary artery without angina pectoris: Secondary | ICD-10-CM | POA: Diagnosis not present

## 2022-01-08 DIAGNOSIS — E785 Hyperlipidemia, unspecified: Secondary | ICD-10-CM | POA: Diagnosis not present

## 2022-01-08 DIAGNOSIS — I10 Essential (primary) hypertension: Secondary | ICD-10-CM | POA: Insufficient documentation

## 2022-01-08 DIAGNOSIS — R0683 Snoring: Secondary | ICD-10-CM | POA: Insufficient documentation

## 2022-01-08 NOTE — Telephone Encounter (Signed)
Notified Stacy Green ok to activate itamar device.  

## 2022-01-08 NOTE — Telephone Encounter (Signed)
Called and made the patient aware that HE may proceed with the Itamar Home Sleep Study. PIN # provided to the patient. Patient made aware that HE will be contacted after the test has been read with the results and any recommendations. Patient verbalized understanding and thanked me for the call.   

## 2022-01-08 NOTE — Patient Instructions (Signed)
Medication Instructions:  Your physician recommends that you continue on your current medications as directed. Please refer to the Current Medication list given to you today.  *If you need a refill on your cardiac medications before your next appointment, please call your pharmacy*   Lab Work: BMET, Mag, Lipid today  If you have labs (blood work) drawn today and your tests are completely normal, you will receive your results only by: [place] (if you have MyChart) OR A paper copy in the mail If you have any lab test that is abnormal or we need to change your treatment, we will call you to review the results.   Testing/Procedures: WatchPAT?  Is a FDA cleared portable home sleep study test that uses a watch and 3 points of contact to monitor 7 different channels, including your heart rate, oxygen saturations, body position, snoring, and chest motion.  The study is easy to use from the comfort of your own home and accurately detect sleep apnea.  Before bed, you attach the chest sensor, attached the sleep apnea bracelet to your nondominant hand, and attach the finger probe.  After the study, the raw data is downloaded from the watch and scored for apnea events.   For more information: [email or web address]  Patient Testing Instructions:  Do not put battery into the device until bedtime when you are ready to begin the test. Please call the support number if you need assistance after following the instructions below: 24 hour support line- [phone] or ITAMAR support at [phone] (option 2)  Download the [employer] First AmericanWatchPAT One" app through the google play store or App Store  Be sure to turn on or enable access to bluetooth in [place] on your smartphone/ device  Make sure no other bluetooth devices are on and within the vicinity of your smartphone/ device and WatchPAT watch during testing.  Make sure to leave your smart phone/ device plugged in and charging all  night.  When ready for bed:  Follow the instructions step by step in the WatchPAT One App to activate the testing device. For additional instructions, including video instruction, visit the WatchPAT One video on Youtube. You can search for [place] One within Youtube (video is 4 minutes and 18 seconds) or enter: https://youtube/watch?v=BCce_vbiwxE Please note: You will be prompted to enter a Pin to connect via bluetooth when starting the test. The PIN will be assigned to you when you receive the test.  The device is disposable, but it recommended that you retain the device until you receive a call letting you know the study has been received and the results have been interpreted.  We will let you know if the study did not transmit to [place] properly after the test is completed. You do not need to call us to confirm the receipt of the test.  Please complete the test within 48 hours of receiving PIN.   Frequently Asked Questions:  What is Watch [place] one?  A single use fully disposable home sleep apnea testing device and will not need to be returned after completion.  What are the requirements to use WatchPAT one?  The be able to have a successful watchpat one sleep study, you should have your Watch pat one device, your smart phone, watch pat one app, your PIN number and Internet access What type of phone do I need?  You should have a smart phone that uses Android 5.1 and above or any Iphone with IOS 10 and above How can I download the [facility]  one app?  Based on your device type search for WatchPAT one app either in google play for android devices or APP store for Iphone's Where will I get my PIN for the study?  Your PIN will be provided by your physician's office. It is used for authentication and if you lose/forget your PIN, please reach out to your providers office.  I do not have Internet at home. Can I do WatchPAT one study?  WatchPAT One needs Internet connection throughout the night to be able to  transmit the sleep data. You can use your home/local internet or your cellular's data package. However, it is always recommended to use home/local Internet. It is estimated that between 20MB-30MB will be used with each study.However, the application will be looking for 80MB space in the phone to start the study.  What happens if I lose internet or bluetooth connection?  During the internet disconnection, your phone will not be able to transmit the sleep data. All the data, will be stored in your phone. As soon as the internet connection is back on, the phone will being sending the sleep data. During the bluetooth disconnection, WatchPAT one will not be able to to send the sleep data to your phone. Data will be kept in the [facility] one until two devices have bluetooth connection back on. As soon as the connection is back on, WatchPAT one will send the sleep data to the phone.  How long do I need to wear the WatchPAT one?  After you start the study, you should wear the device at least 6 hours.  How far should I keep my phone from the device?  During the night, your phone should be within 15 feet.  What happens if I leave the room for restroom or other reasons?  Leaving the room for any reason will not cause any problem. As soon as your get back to the room, both devices will reconnect and will continue to send the sleep data. Can I use my phone during the sleep study?  Yes, you can use your phone as usual during the study. But it is recommended to put your watchpat one on when you are ready to go to bed.  How will I get my study results?  A soon as you completed your study, your sleep data will be sent to the provider. They will then share the results with you when they are ready.   Follow-Up: At [facility], you and your health needs are our priority.  As part of our continuing mission to provide you with exceptional heart care, we have created designated Provider Care Teams.  These Care  Teams include your primary Cardiologist (physician) and Advanced Practice Providers (APPs -  Physician Assistants and Nurse Practitioners) who all work together to provide you with the care you need, when you need it.  We recommend signing up for the patient portal called "MyChart".  Sign up information is provided on this After Visit Summary.  MyChart is used to connect with patients for Virtual Visits (Telemedicine).  Patients are able to view lab/test results, encounter notes, upcoming appointments, etc.  Non-urgent messages can be sent to your provider as well.   To learn more about what you can do with MyChart, go to [email or web address].    Your next appointment:   January after echo with Dr. Gardiner Rhyme

## 2022-01-09 LAB — BASIC METABOLIC PANEL
BUN/Creatinine Ratio: 20 (ref 10–24)
BUN: 19 mg/dL (ref 8–27)
CO2: 26 mmol/L (ref 20–29)
Calcium: 9.9 mg/dL (ref 8.6–10.2)
Chloride: 100 mmol/L (ref 96–106)
Creatinine, Ser: 0.96 mg/dL (ref 0.76–1.27)
Glucose: 83 mg/dL (ref 70–99)
Potassium: 4.4 mmol/L (ref 3.5–5.2)
Sodium: 140 mmol/L (ref 134–144)
eGFR: 88 mL/min/{1.73_m2} (ref >59)

## 2022-01-09 LAB — LIPID PANEL
Chol/HDL Ratio: 2.6 ratio (ref 0.0–5.0)
Cholesterol, Total: 105 mg/dL (ref 100–199)
HDL: 40 mg/dL (ref >39)
LDL Chol Calc (NIH): 48 mg/dL (ref 0–99)
Triglycerides: 83 mg/dL (ref 0–149)
VLDL Cholesterol Cal: 17 mg/dL (ref 5–40)

## 2022-01-09 LAB — MAGNESIUM: Magnesium: 2.2 mg/dL (ref 1.6–2.3)

## 2022-01-12 ENCOUNTER — Ambulatory Visit: Payer: Medicare Other | Admitting: Adult Health

## 2022-01-21 ENCOUNTER — Telehealth: Payer: Self-pay | Admitting: Adult Health

## 2022-01-21 NOTE — Telephone Encounter (Signed)
Ok to give more samples?

## 2022-01-21 NOTE — Telephone Encounter (Signed)
Pt informed,he said he spoke to her already

## 2022-01-21 NOTE — Telephone Encounter (Signed)
Next visit is 04/20/22. Menelik is still waiting for patient assistance to approve his Trintellix but he is totally out of the samples he was given last time. Could he please get some more samples until he hears from patient assistance? He can pick them up between 11-4 pm today. His phone number is [phone].

## 2022-01-21 NOTE — Telephone Encounter (Signed)
Give him another month supply - also check and see if Leda Gauze has an update. Ty.

## 2022-01-27 ENCOUNTER — Encounter (HOSPITAL_BASED_OUTPATIENT_CLINIC_OR_DEPARTMENT_OTHER): Payer: Medicare Other | Admitting: Cardiology

## 2022-01-27 ENCOUNTER — Telehealth: Payer: Self-pay | Admitting: Adult Health

## 2022-01-27 DIAGNOSIS — G4733 Obstructive sleep apnea (adult) (pediatric): Secondary | ICD-10-CM | POA: Diagnosis not present

## 2022-01-27 NOTE — Telephone Encounter (Signed)
Thomas [name] has been approved for Pt. Assistance for his Trintellix until 01/15/2023.  He has received his first shipment.  This note in his chart that he has this assistance.

## 2022-01-28 NOTE — Procedures (Signed)
SLEEP STUDY REPORT Patient Information Study Date: 01/27/22 Patient Name: Thomas [name] Patient ID: [id number] Birth Date: Jun 27, 2055 Age: 66 Gender: Male BMI: 37.3 (W=267 lb, H=5' 11'') Referring Physician: Oswaldo Milian, MD  TEST DESCRIPTION: Home sleep apnea testing was completed using the WatchPat, a Type 1 device, utilizing peripheral arterial tonometry (PAT), chest movement, actigraphy, pulse oximetry, pulse rate, body position and snore.  AHI was calculated with apnea and hypopnea using valid sleep time as the denominator. RDI includes apneas, hypopneas, and RERAs.  The data acquired and the scoring of sleep and all associated events were performed in accordance with the recommended standards and specifications as outlined in the AASM Manual for the Scoring of Sleep and Associated Events 2.2.0 (2015).  FINDINGS:  1.  Severe Obstructive Sleep Apnea with AHI 34.8/hr.   2.  Mild Central Sleep Apnea with pAHIc 10.3/hr.  3.  Oxygen desaturations as low as 83%.  4.  Moderate to severe snoring was present. O2 sats were < 88% for 4.1 min.  5.  Total sleep time was 7 hrs and 47 min.  6.  17.1% of total sleep time was spent in REM sleep.   7.  Shortened sleep onset latency at 6 min.   8.  Prolonged REM sleep onset latency at 133 min.   9.  Total awakenings were 11.  10. Arrhythmia detection:  None.  DIAGNOSIS:   Severe Obstructive Sleep Apnea (G47.33)  RECOMMENDATIONS:   1.  Clinical correlation of these findings is necessary.  The decision to treat obstructive sleep apnea (OSA) is usually based on the presence of apnea symptoms or the presence of associated medical conditions such as Hypertension, Congestive Heart Failure, Atrial Fibrillation or Obesity.  The most common symptoms of OSA are snoring, gasping for breath while sleeping, daytime sleepiness and fatigue.   2.  Initiating apnea therapy is recommended given the presence of symptoms and/or associated conditions.  Recommend proceeding with one of the following:     a.  Auto-CPAP therapy with a pressure range of 5-20cm H2O.     b.  An oral appliance (OA) that can be obtained from certain dentists with expertise in sleep medicine.  These are primarily of use in non-obese patients with mild and moderate disease.     c.  An ENT consultation which may be useful to look for specific causes of obstruction and possible treatment options.     d.  If patient is intolerant to PAP therapy, consider referral to ENT for evaluation for hypoglossal nerve stimulator.   3.  Close follow-up is necessary to ensure success with CPAP or oral appliance therapy for maximum benefit.  4.  A follow-up oximetry study on CPAP is recommended to assess the adequacy of therapy and determine the need for supplemental oxygen or the potential need for Bi-level therapy.  An arterial blood gas to determine the adequacy of baseline ventilation and oxygenation should also be considered.  5.  Healthy sleep recommendations include:  adequate nightly sleep (normal 7-9 hrs/night), avoidance of caffeine after noon and alcohol near bedtime, and maintaining a sleep environment that is cool, dark and quiet.  6.  Weight loss for overweight patients is recommended.  Even modest amounts of weight loss can significantly improve the severity of sleep apnea.  7.  Snoring recommendations include:  weight loss where appropriate, side sleeping, and avoidance of alcohol before bed.  8.  Operation of motor vehicle should be avoided when sleepy.  Signature: Fransico Him, MD; [facility];  [place], Tax adviser of Sleep Medicine Electronically Signed: 01/28/22

## 2022-01-30 ENCOUNTER — Ambulatory Visit: Payer: Medicare Other | Attending: Cardiology

## 2022-01-30 DIAGNOSIS — R0683 Snoring: Secondary | ICD-10-CM

## 2022-01-30 DIAGNOSIS — I10 Essential (primary) hypertension: Secondary | ICD-10-CM

## 2022-01-30 DIAGNOSIS — I2510 Atherosclerotic heart disease of native coronary artery without angina pectoris: Secondary | ICD-10-CM

## 2022-02-02 ENCOUNTER — Telehealth: Payer: Self-pay | Admitting: *Deleted

## 2022-02-02 NOTE — Telephone Encounter (Signed)
Message left to return a call to discuss sleep study results and recommendations. 

## 2022-02-02 NOTE — Telephone Encounter (Signed)
-----   Message from Sueanne Margarita, MD sent at 01/28/2022  8:04 AM EST ----- Please let patient know that they have sleep apnea.  Recommend therapeutic CPAP titration for treatment of patient's sleep disordered breathing.  If unable to perform an in lab titration then initiate ResMed auto CPAP from 4 to 15cm H2O with heated humidity and mask of choice and overnight pulse ox on CPAP.

## 2022-02-18 NOTE — Telephone Encounter (Signed)
Left a second message to contact me to discuss sleep study results.

## 2022-03-20 ENCOUNTER — Telehealth: Payer: Self-pay | Admitting: *Deleted

## 2022-03-20 NOTE — Telephone Encounter (Signed)
2 phone messages have left for patient to return a call to discuss sleep study results and recommendations. No return call. Mychart message will be sent to the patient.

## 2022-03-27 ENCOUNTER — Other Ambulatory Visit: Payer: Self-pay | Admitting: Cardiology

## 2022-03-27 ENCOUNTER — Telehealth: Payer: Self-pay | Admitting: *Deleted

## 2022-03-27 DIAGNOSIS — G4736 Sleep related hypoventilation in conditions classified elsewhere: Secondary | ICD-10-CM

## 2022-03-27 DIAGNOSIS — G4733 Obstructive sleep apnea (adult) (pediatric): Secondary | ICD-10-CM

## 2022-03-27 NOTE — Telephone Encounter (Signed)
Sleep study results and recommendations were discussed with the patient. He agrees to proceed with CPAP titration study. All questions have been addressed.

## 2022-03-30 ENCOUNTER — Ambulatory Visit (HOSPITAL_COMMUNITY): Payer: Medicare Other | Attending: Cardiology

## 2022-03-30 DIAGNOSIS — I350 Nonrheumatic aortic (valve) stenosis: Secondary | ICD-10-CM | POA: Diagnosis not present

## 2022-03-30 LAB — ECHOCARDIOGRAM COMPLETE
AR max vel: 0.88 cm2
AV Area VTI: 0.91 cm2
AV Area mean vel: 0.93 cm2
AV Mean grad: 33.5 mmHg
AV Peak grad: 63.8 mmHg
Ao pk vel: 4 m/s
Area-P 1/2: 2.12 cm2
S' Lateral: 3.4 cm

## 2022-03-30 NOTE — Progress Notes (Signed)
Cardiology Office Note:    Date:  04/02/2022   ID:  Thomas [name], DOB 1955-05-14, MRN [id number]  PCP:  Kathalene Frames, MD  Cardiologist:  Donato Heinz, MD  Electrophysiologist:  None   Referring MD: Josetta Huddle, MD   Chief Complaint  Patient presents with   Aortic Stenosis    History of Present Illness:    Thomas [name] is a 67 y.o. male with a hx of CAD, hypertension, hyperlipidemia, aortic stenosis, obesity who presents for follow-up.  Echocardiogram 01/27/2019 showed EF 55 to 60%, moderate LVH, grade 1 diastolic dysfunction, normal RV function, mild to moderate aortic stenosis.  Coronary CTA on 02/23/2019 showed nonobstructive CAD, calcium score 113 (87th percentile).  Zio patch x6 days on 06/12/2020 showed 1 episode of NSVT lasting 4 beats, frequent PVCs (6.3% of beats).  Echocardiogram 05/28/2021 showed EF 65 to 70%, moderate LVH, grade 1 diastolic dysfunction, normal RV function, severe left atrial enlargement, moderate aortic stenosis.  Echocardiogram 03/30/2022 showed EF 60 to 46%, grade 1 diastolic dysfunction, normal RV function, moderate to severe aortic stenosis.  Since last clinic visit, he reports he has been doing well.  Denies any chest pain or dyspnea.  States that he is very active, will work 12 to 14 hours during work on house.  Reports occasional lightheadedness with standing, denies any syncope.  Reports he has lost 20 pounds from last year.  BP Readings from Last 3 Encounters:  04/02/22 128/78  01/08/22 (!) 142/84  09/09/21 (!) 144/88    Wt Readings from Last 3 Encounters:  04/02/22 267 lb 6.4 oz (121.3 kg)  01/08/22 267 lb 12.8 oz (121.5 kg)  09/09/21 277 lb (125.6 kg)     Past Medical History:  Diagnosis Date   Anemia    hx of in Winter    lower back    Back pain    Blockage of coronary artery of heart (HCC)    Constipation    Generalized muscle weakness    GERD (gastroesophageal reflux disease)     rarely    Hearing loss    History of anxiety    Hyperlipemia    Hypertension    IBS (irritable bowel syndrome)    Joint pain    Lower urinary tract symptoms (LUTS)    Malignant neoplasm of prostate ([place]) 04/17/2014   Other fatigue    Pneumonia    hx x2 of pneumonia at age 47    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   Shortness of breath on exertion    Ventral hernia without obstruction or gangrene 03/27/2016   Vitamin D deficiency     Past Surgical History:  Procedure Laterality Date   Epididymis Excision Left    Spermocele   HEMORROIDECTOMY     HERNIA REPAIR Bilateral 1967 and 1986   x2 open inguinal hernia repairs   INCISION AND DRAINAGE OF WOUND  2017   Wound Infection Post OP   LYMPHADENECTOMY Bilateral 05/17/2014   Procedure: PELVIC LYMPHADENECTOMY;  Surgeon: Raynelle Bring, MD;  Location: [facility];  Service: Urology;  Laterality: Bilateral;   PROSTATE BIOPSY  03/22/14   Raynelle Bring   ROBOT ASSISTED LAPAROSCOPIC RADICAL PROSTATECTOMY N/A 05/17/2014   Procedure: ROBOTIC ASSISTED LAPAROSCOPIC RADICAL PROSTATECTOMY LEVEL 2;  Surgeon: Raynelle Bring, MD;  Location: [facility];  Service: Urology;  Laterality: N/A;   SKIN TAG REMOVAL     15 lesions   VASECTOMY  VENTRAL HERNIA REPAIR N/A 03/27/2016   Procedure: LAPAROSCOPIC VENTRAL HERNIA;  Surgeon: Alphonsa Overall, MD;  Location: [facility];  Service: General;  Laterality: N/A;  WITH MESH    Current Medications: Current Meds  Medication Sig   aspirin EC 81 MG tablet Take 81 mg by mouth daily. Swallow whole.   Cholecalciferol (VITAMIN D) 50 MCG (2000 UT) tablet Take 1 tablet (2,000 Units total) by mouth daily.   ezetimibe (ZETIA) 10 MG tablet Take 1 tablet (10 mg total) by mouth daily.   fluticasone (FLONASE) 50 MCG/ACT nasal spray Place 2 sprays into both nostrils daily as needed for allergies or rhinitis.   rosuvastatin (CRESTOR) 20 MG tablet Take 40 mg by mouth daily.   valsartan-hydrochlorothiazide (DIOVAN-HCT) 320-25 MG  tablet Take 1 tablet by mouth daily.   vortioxetine HBr (TRINTELLIX) 20 MG TABS tablet Take 1 tablet (20 mg total) by mouth daily.     Allergies:   Amoxicillin-pot clavulanate and Topiramate   Social History   Socioeconomic History   Marital status: Married    Spouse name: [name]   Number of children: 2   Years of education: Not on file   Highest education level: Not on file  Occupational History   Occupation: Retired  Tobacco Use   Smoking status: Never   Smokeless tobacco: Never  Vaping Use   Vaping Use: Never used  Substance and Sexual Activity   Alcohol use: No    Alcohol/week: 0.0 standard drinks of alcohol   Drug use: No   Sexual activity: Yes    Partners: Male  Other Topics Concern   Not on file  Social History Narrative   Not on file   Social Determinants of Health   Financial Resource Strain: Not on file  Food Insecurity: Not on file  Transportation Needs: Not on file  Physical Activity: Not on file  Stress: Not on file  Social Connections: Not on file     Family History: The patient's family history includes Alcohol abuse in his mother and another family member; Anxiety disorder in his mother and another family member; Bipolar disorder in his mother and another family member; COPD in his mother; Cancer in his father and mother; Depression in his mother and another family member; Drug abuse in his father and another family member; Eating disorder in his mother and another family member; Heart attack in his mother and paternal grandfather; Heart disease in his mother and another family member; Heart failure in his mother; Hyperlipidemia in his mother; Hypertension in his mother; Kidney disease in his mother and another family member; Ovarian cancer in his mother.  ROS:   Please see the history of present illness.     All other systems reviewed and are negative.  EKGs/Labs/Other Studies Reviewed:    The following studies were reviewed today:   EKG:    01/08/22: Sinus rhythm, PVC, rate 64, no ST abnormalities  Recent Labs: 06/10/2021: ALT 46; Hemoglobin 17.3; Platelets 169; TSH 2.130 01/08/2022: BUN 19; Creatinine, Ser 0.96; Magnesium 2.2; Potassium 4.4; Sodium 140  Recent Lipid Panel    Component Value Date/Time   CHOL 105 01/08/2022 1034   TRIG 83 01/08/2022 1034   HDL 40 01/08/2022 1034   CHOLHDL 2.6 01/08/2022 1034   LDLCALC 48 01/08/2022 1034    Physical Exam:    VS:  BP 128/78 (BP Location: Left Arm, Patient Position: Sitting, Cuff Size: Large)   Pulse (!) 53   Ht '5\' 11"'$  (1.803 m)   Wt  267 lb 6.4 oz (121.3 kg)   SpO2 97%   BMI 37.29 kg/m     Wt Readings from Last 3 Encounters:  04/02/22 267 lb 6.4 oz (121.3 kg)  01/08/22 267 lb 12.8 oz (121.5 kg)  09/09/21 277 lb (125.6 kg)     GEN:  Well nourished, well developed in no acute distress HEENT: Normal NECK: No JVD; No carotid bruits LYMPHATICS: No lymphadenopathy CARDIAC: RRR, 3/6 systolic murmur RESPIRATORY:  Clear to auscultation without rales, wheezing or rhonchi  ABDOMEN: Soft, non-tender, non-distended MUSCULOSKELETAL:  No edema; No deformity  SKIN: Warm and dry NEUROLOGIC:  Alert and oriented x 3 PSYCHIATRIC:  Normal affect   ASSESSMENT:    1. Aortic valve stenosis, etiology of cardiac valve disease unspecified   2. CAD in native artery   3. Essential hypertension   4. Hyperlipidemia, unspecified hyperlipidemia type   5. OSA (obstructive sleep apnea)     PLAN:    Aortic stenosis: Echocardiogram 03/30/2022 showed EF 60 to 44%, grade 1 diastolic dysfunction, normal RV function, moderate to severe aortic stenosis -He is very active, appears asymptomatic.  Will monitor closely, plan repeat echo in 6 months.  Asked him to call and let us know if he develops any chest pain, dyspnea, or lightheadedness/syncope  CAD: Coronary CTA on 02/23/2019 showed nonobstructive CAD, calcium score 113 (87th percentile).  Echocardiogram 05/28/2021 showed EF 65 to 70%,  moderate LVH, grade 1 diastolic dysfunction, normal RV function, severe left atrial enlargement, moderate aortic stenosis. -Continue rosuvastatin and Zetia  Hypertension: On valsartan-HCTZ 320-25 mg daily.  Appears controlled  Hyperlipidemia: On rosuvastatin 20 mg daily and Zetia 10 mg daily.  LDL 48 on 01/08/22  OSA: severe on sleep study 01/2022, starting CPAP  RTC in 6 months    Medication Adjustments/Labs and Tests Ordered: Current medicines are reviewed at length with the patient today.  Concerns regarding medicines are outlined above.  Orders Placed This Encounter  Procedures   ECHOCARDIOGRAM COMPLETE   No orders of the defined types were placed in this encounter.   Patient Instructions  Medication Instructions:  No changes *If you need a refill on your cardiac medications before your next appointment, please call your pharmacy*  Testing/Procedures: Echocardiogram in 6 mos: Your physician has requested that you have an echocardiogram. Echocardiography is a painless test that uses sound waves to create images of your heart. It provides your doctor with information about the size and shape of your heart and how well your heart's chambers and valves are working. This procedure takes approximately one hour. There are no restrictions for this procedure. Please do NOT wear cologne, perfume, aftershave, or lotions (deodorant is allowed). Please arrive 15 minutes prior to your appointment time.    Follow-Up: At [facility], you and your health needs are our priority.  As part of our continuing mission to provide you with exceptional heart care, we have created designated Provider Care Teams.  These Care Teams include your primary Cardiologist (physician) and Advanced Practice Providers (APPs -  Physician Assistants and Nurse Practitioners) who all work together to provide you with the care you need, when you need it.  We recommend signing up for the patient portal called  "MyChart".  Sign up information is provided on this After Visit Summary.  MyChart is used to connect with patients for Virtual Visits (Telemedicine).  Patients are able to view lab/test results, encounter notes, upcoming appointments, etc.  Non-urgent messages can be sent to your provider as well.  To learn more about what you can do with MyChart, go to [email or web address].    Your next appointment:   6 month(s) (after Echo)  Provider:   Donato Heinz, MD        Signed, Donato Heinz, MD  04/02/2022 5:46 PM    [facility]

## 2022-04-02 ENCOUNTER — Encounter: Payer: Self-pay | Admitting: Cardiology

## 2022-04-02 ENCOUNTER — Ambulatory Visit: Payer: Medicare Other | Attending: Cardiology | Admitting: Cardiology

## 2022-04-02 VITALS — BP 128/78 | HR 53 | Ht 71.0 in | Wt 267.4 lb

## 2022-04-02 DIAGNOSIS — G4733 Obstructive sleep apnea (adult) (pediatric): Secondary | ICD-10-CM | POA: Diagnosis present

## 2022-04-02 DIAGNOSIS — I10 Essential (primary) hypertension: Secondary | ICD-10-CM | POA: Diagnosis not present

## 2022-04-02 DIAGNOSIS — I2510 Atherosclerotic heart disease of native coronary artery without angina pectoris: Secondary | ICD-10-CM

## 2022-04-02 DIAGNOSIS — I350 Nonrheumatic aortic (valve) stenosis: Secondary | ICD-10-CM | POA: Diagnosis not present

## 2022-04-02 DIAGNOSIS — E785 Hyperlipidemia, unspecified: Secondary | ICD-10-CM | POA: Diagnosis not present

## 2022-04-02 NOTE — Patient Instructions (Signed)
Medication Instructions:  No changes *If you need a refill on your cardiac medications before your next appointment, please call your pharmacy*  Testing/Procedures: Echocardiogram in 6 mos: Your physician has requested that you have an echocardiogram. Echocardiography is a painless test that uses sound waves to create images of your heart. It provides your doctor with information about the size and shape of your heart and how well your heart's chambers and valves are working. This procedure takes approximately one hour. There are no restrictions for this procedure. Please do NOT wear cologne, perfume, aftershave, or lotions (deodorant is allowed). Please arrive 15 minutes prior to your appointment time.    Follow-Up: At [facility], you and your health needs are our priority.  As part of our continuing mission to provide you with exceptional heart care, we have created designated Provider Care Teams.  These Care Teams include your primary Cardiologist (physician) and Advanced Practice Providers (APPs -  Physician Assistants and Nurse Practitioners) who all work together to provide you with the care you need, when you need it.  We recommend signing up for the patient portal called "MyChart".  Sign up information is provided on this After Visit Summary.  MyChart is used to connect with patients for Virtual Visits (Telemedicine).  Patients are able to view lab/test results, encounter notes, upcoming appointments, etc.  Non-urgent messages can be sent to your provider as well.   To learn more about what you can do with MyChart, go to [email or web address].    Your next appointment:   6 month(s) (after Echo)  Provider:   Donato Heinz, MD

## 2022-04-08 ENCOUNTER — Telehealth: Payer: Self-pay | Admitting: Cardiology

## 2022-04-08 NOTE — Telephone Encounter (Signed)
STAT if HR is under 50 or over 120 (normal HR is 60-100 beats per minute)  What is your heart rate? Right now it is 60, earlier it was 6 and it have been low as low as 36  Do you have a log of your heart rate readings (document readings)?   Do you have any other symptoms? No symptoms- patient wants to be seen asap

## 2022-04-08 NOTE — Telephone Encounter (Signed)
STAT if HR is under 50 or over 120 (normal HR is 60-100 beats per minute)   What is your heart rate? Right now it is 29, earlier it was 74 and it have been low as low as 36   Do you have a log of your heart rate readings (document readings)?    Do you have any other symptoms? No symptoms- patient wants to be seen asap   Called and spoke with patient, and wife, on speaker. They are concerned because from 1/18-1/24 he has received 57 notifications for low pulse (less than 50). He received 5 notifications last night from I-watch, woke him up, his hr got as low as 36. His I-watch says his resting hr is 39-53 and it has never been that low; his resting rate is usually in the 60's. He is very concerned due to his recent diagnosis of Aortic Stenosis.  Pt reports NONE of the following s/s: NO chest/back/jaw pain, SOB, dizziness, light headed, nausea/vomiting, sweating,  Pt reports that when his hr goes down/lower and he gets up and moves around his hr does increase; Heartrate increases/responds to activity. He has an appointment tomorrow with Coletta Memos, NP.   Told him to follow up at appointment tomorrow- discuss low heartrate and also get scheduled for sleep apnea testing (hr drops during sleep).  Told him that if he does develop any of the s/s listed above, to go to the ER to be evaluated.  He verbalized understanding of information and instructions.

## 2022-04-08 NOTE — Progress Notes (Unsigned)
Cardiology Clinic Note   Patient Name: Thomas [name] Date of Encounter: 04/09/2022  Primary Care Provider:  Kathalene Frames, MD Primary Cardiologist:  Donato Heinz, MD  Patient Profile    Thomas [name] 67 year old male presents to the clinic today for  evaluation of his low heart rate.  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   Shortness of breath on exertion    Ventral hernia without obstruction or gangrene 03/27/2016   Vitamin D deficiency    Past Surgical History:  Procedure Laterality Date   Epididymis Excision Left    Spermocele   HEMORROIDECTOMY     HERNIA REPAIR Bilateral 1967 and 1986   x2 open inguinal hernia repairs   INCISION AND DRAINAGE OF WOUND  2017   Wound Infection Post OP   LYMPHADENECTOMY Bilateral 05/17/2014   Procedure: PELVIC LYMPHADENECTOMY;  Surgeon: Raynelle Bring, MD;  Location: [facility];  Service: Urology;  Laterality: Bilateral;   PROSTATE BIOPSY  03/22/14   Raynelle Bring   ROBOT ASSISTED LAPAROSCOPIC RADICAL PROSTATECTOMY N/A 05/17/2014   Procedure: ROBOTIC ASSISTED LAPAROSCOPIC RADICAL PROSTATECTOMY LEVEL 2;  Surgeon: Raynelle Bring, MD;  Location: [facility];  Service: Urology;  Laterality: N/A;   SKIN TAG REMOVAL     15 lesions   VASECTOMY     VENTRAL HERNIA REPAIR N/A 03/27/2016   Procedure: LAPAROSCOPIC VENTRAL HERNIA;  Surgeon: Alphonsa Overall, MD;   Location: [facility];  Service: General;  Laterality: N/A;  WITH MESH    Allergies  Allergies  Allergen Reactions   Amoxicillin-Pot Clavulanate Other (See Comments)   Topiramate Other (See Comments)    History of Present Illness    [name] has a PMH of coronary artery disease, HTN, HLD, aortic stenosis, obesity.  His echocardiogram 01/27/2019 showed an EF of 60-65%, moderate LVH, G1 DD, normal RV function, mild-moderate aortic stenosis.  His coronary CTA 12/20 showed nonobstructive CAD and a calcium score of 113.  He wore a cardiac event monitor x 6 days 3/22 which showed 1 episode of NSVT lasting 4 beats, frequent PVCs (6.3%).  Echocardiogram 05/28/2021 showed an EF of 65-70%, moderate LVH, G1 DD, normal RV function, severe left atrial enlargement, moderate aortic stenosis.  His echocardiogram 03/30/2022 showed an EF of 60-65%, G1 DD, normal RV function, moderate-severe aortic stenosis.  He was seen in follow-up by Dr. Gardiner Rhyme on 04/02/2022.  During that time he reported he was doing well.  He denied chest pain and dyspnea.  He stated that he was very active and was working 12-14 hours on his house at a time.  He reported occasional lightheadedness with standing.  He denied any syncope.  He  reported that he had lost around 20 pounds over the course of a year.  He contacted the nurse triage line 04/08/2022 and reported that his heart rate was 59.  He had been monitoring his heart rate and noted heart rates in the 40s and as low as 36.  He denied symptoms.  He had been receiving notifications for low pulse on his iWatch.  He reported concern due to his recent diagnosis of aortic stenosis.  He was added to my schedule today.  He presents to the clinic today for evaluation and states since he was seen in the office earlier in the month he has noticed several nights of low heart rate.  He reports that he typically does not wear his watch at night and has just started doing so.  He has noted heart  rates below 50 while sleeping.  He denies episodes of dizziness.  He reports that he has increased his physical activity and actually feels better than he has in a long time.  He is newly retired and is now working at [employer].  We reviewed his recent echocardiogram and previous coronary CT.  He expressed understanding.  His EKG today shows sinus bradycardia with possible premature atrial complexes and aberrant conduction 56 bpm.  I will order CBC, BMP, TSH 7-day cardiac event monitor and plan follow-up in 8 weeks.  Today denies chest pain, shortness of breath, lower extremity edema, fatigue, palpitations, melena, hematuria, hemoptysis, diaphoresis, weakness, presyncope, syncope, orthopnea, and PND.   Home Medications    Prior to Admission medications   Medication Sig Start Date End Date Taking? Authorizing Provider  aspirin EC 81 MG tablet Take 81 mg by mouth daily. Swallow whole.    [provider]  Cholecalciferol (VITAMIN D) 50 MCG (2000 UT) tablet Take 1 tablet (2,000 Units total) by mouth daily. 07/28/21   Opalski, Neoma Laming, DO  ezetimibe (ZETIA) 10 MG tablet Take 1 tablet (10 mg total) by mouth daily. 10/17/21 10/12/22  Donato Heinz, MD  fluticasone (FLONASE) 50 MCG/ACT nasal spray Place 2 sprays into both nostrils daily as needed for allergies or rhinitis.    [provider]  NEOMYCIN-POLYMYXIN-HYDROCORTISONE (CORTISPORIN) 1 % SOLN OTIC solution Apply 1-2 drops to toe BID after soaking 10/07/21   Hyatt, Max T, DPM  rosuvastatin (CRESTOR) 20 MG tablet Take 40 mg by mouth daily.    [provider]  valsartan-hydrochlorothiazide (DIOVAN-HCT) 320-25 MG tablet Take 1 tablet by mouth daily. 10/21/21   [provider]  vortioxetine HBr (TRINTELLIX) 20 MG TABS tablet Take 1 tablet (20 mg total) by mouth daily. 12/18/21   Mozingo, Berdie Ogren, NP    Family History    Family History  Problem Relation Age of Onset   Eating disorder Mother     Alcohol abuse Mother    Bipolar disorder Mother    Anxiety disorder Mother    Depression Mother    Kidney disease Mother    Heart disease Mother    Hyperlipidemia Mother    Hypertension Mother    Heart attack Mother    Cancer Mother    COPD Mother    Ovarian cancer Mother    Heart failure Mother    Drug abuse Father    Cancer Father    Heart attack Paternal Grandfather    Heart disease Other    Kidney disease Other    Depression Other    Anxiety disorder Other    Bipolar disorder Other    Alcohol  abuse Other    Drug abuse Other    Eating disorder Other    He indicated that his mother is deceased. He indicated that the status of his father is unknown. He indicated that the status of his paternal grandfather is unknown. He indicated that the status of his other is unknown.  Social History    Social History   Socioeconomic History   Marital status: Married    Spouse name: [name]   Number of children: 2   Years of education: Not on file   Highest education level: Not on file  Occupational History   Occupation: Retired  Tobacco Use   Smoking status: Never   Smokeless tobacco: Never  Vaping Use   Vaping Use: Never used  Substance and Sexual Activity   Alcohol use: No    Alcohol/week: 0.0 standard drinks of alcohol   Drug use: No   Sexual activity: Yes    Partners: Male  Other Topics Concern   Not on file  Social History Narrative   Not on file   Social Determinants of Health   Financial Resource Strain: Not on file  Food Insecurity: Not on file  Transportation Needs: Not on file  Physical Activity: Not on file  Stress: Not on file  Social Connections: Not on file  Intimate Partner Violence: Not on file     Review of Systems    General:  No chills, fever, night sweats or weight changes.  Cardiovascular:  No chest pain, dyspnea on exertion, edema, orthopnea, palpitations, paroxysmal nocturnal dyspnea. Dermatological: No rash,  lesions/masses Respiratory: No cough, dyspnea Urologic: No hematuria, dysuria Abdominal:   No nausea, vomiting, diarrhea, bright red blood per rectum, melena, or hematemesis Neurologic:  No visual changes, wkns, changes in mental status. All other systems reviewed and are otherwise negative except as noted above.  Physical Exam    VS:  BP 130/74   Pulse (!) 57   Ht '5\' 11"'$  (1.803 m)   Wt 274 lb 3.2 oz (124.4 kg)   SpO2 99%   BMI 38.24 kg/m  , BMI Body mass index is 38.24 kg/m. GEN: Well nourished, well developed, in no acute distress. HEENT: normal. Neck: Supple, no JVD, carotid bruits, or masses. Cardiac: RRR, 3/6 systolic murmur heard at apex, rubs, or gallops. No clubbing, cyanosis, edema.  Radials/DP/PT 2+ and equal bilaterally.  Respiratory:  Respirations regular and unlabored, clear to auscultation bilaterally. GI: Soft, nontender, nondistended, BS + x 4. MS: no deformity or atrophy. Skin: warm and dry, no rash. Neuro:  Strength and sensation are intact. Psych: Normal affect.  Accessory Clinical Findings    Recent Labs: 06/10/2021: ALT 46; Hemoglobin 17.3; Platelets 169; TSH 2.130 01/08/2022: BUN 19; Creatinine, Ser 0.96; Magnesium 2.2; Potassium 4.4; Sodium 140   Recent Lipid Panel    Component Value Date/Time   CHOL 105 01/08/2022 1034   TRIG 83 01/08/2022 1034   HDL 40 01/08/2022 1034   CHOLHDL 2.6 01/08/2022 1034   LDLCALC 48 01/08/2022 1034         ECG personally reviewed by me today-sinus bradycardia with possible premature atrial complexes with apparent conduction 56 bpm- No acute changes  Assessment & Plan   1.  Low heart rate-EKG today showssinus bradycardia with possible premature atrial complexes with apparent conduction 56 bpm.  Reports that his iWatch has been notifying him of low heart rates less than 50.  He reports that his heart rate has been as low as 36.  He denied  symptoms.  Avoid AV nodal blocking agents. Patient reassured that his blood  pressure has remained stable.  Discussed physiology of heart rate at rest and with increased fitness. Order 7-day cardiac event monitor Order CBC, BMP, TSH, free T4  Aortic stenosis-no increased DOE or activity intolerance.  Echocardiogram 03/30/2022 showed 60-65% EF, G1 DD, normal RV function, moderate-severe aortic stenosis.  Reviewed results. Continue current medical therapy Continue current physical activity Plan for repeat echocardiogram annually  Coronary artery disease-no chest pain today.  Was noted to have nonobstructive CAD via coronary CTA 02/23/2019. Continue heart healthy low-sodium high-fiber diet Maintain physical activity Continue rosuvastatin and ezetimibe  Essential hypertension-BP today 130/74.  Does report occasional episodes of lightheadedness. Continue valsartan, HCTZ  OSA-reports compliance with CPAP.  Waking up well rested. Continue CPAP use  Disposition: Follow-up with Dr. Gardiner Rhyme or me after cardiac event monitor.   Jossie Ng. Jennaya Pogue NP-C     04/09/2022, 1:47 PM [facility] [address] Office [phone] Fax [phone]    I spent 15 minutes examining this patient, reviewing medications, and using patient centered shared decision making involving her cardiac care.  Prior to her visit I spent greater than 20 minutes reviewing her past medical history,  medications, and prior cardiac tests.

## 2022-04-09 ENCOUNTER — Ambulatory Visit: Payer: Medicare Other | Attending: General Practice | Admitting: General Practice

## 2022-04-09 ENCOUNTER — Ambulatory Visit (INDEPENDENT_AMBULATORY_CARE_PROVIDER_SITE_OTHER): Payer: Medicare Other

## 2022-04-09 ENCOUNTER — Encounter: Payer: Self-pay | Admitting: General Practice

## 2022-04-09 VITALS — BP 130/74 | HR 57 | Ht 71.0 in | Wt 274.2 lb

## 2022-04-09 DIAGNOSIS — I10 Essential (primary) hypertension: Secondary | ICD-10-CM | POA: Diagnosis not present

## 2022-04-09 DIAGNOSIS — G4733 Obstructive sleep apnea (adult) (pediatric): Secondary | ICD-10-CM | POA: Diagnosis present

## 2022-04-09 DIAGNOSIS — R001 Bradycardia, unspecified: Secondary | ICD-10-CM | POA: Diagnosis not present

## 2022-04-09 DIAGNOSIS — I350 Nonrheumatic aortic (valve) stenosis: Secondary | ICD-10-CM | POA: Diagnosis present

## 2022-04-09 DIAGNOSIS — I2510 Atherosclerotic heart disease of native coronary artery without angina pectoris: Secondary | ICD-10-CM

## 2022-04-09 NOTE — Progress Notes (Unsigned)
Enrolled for [employer] to mail a ZIO XT long term holter monitor to the patients address on file.  ? ?Dr. Schumann to read. ?

## 2022-04-09 NOTE — Patient Instructions (Signed)
Medication Instructions:  The current medical regimen is effective;  continue present plan and medications as directed. Please refer to the Current Medication list given to you today.  *If you need a refill on your cardiac medications before your next appointment, please call your pharmacy*  Lab Work: BMET, CBC AND TSH TODAY If you have labs (blood work) drawn today and your tests are completely normal, you will receive your results only by: [place] (if you have MyChart) OR  A paper copy in the mail If you have any lab test that is abnormal or we need to change your treatment, we will call you to review the results.  Other Instructions 7 DAY MONITOR   Follow-Up: At [facility], you and your health needs are our priority.  As part of our continuing mission to provide you with exceptional heart care, we have created designated Provider Care Teams.  These Care Teams include your primary Cardiologist (physician) and Advanced Practice Providers (APPs -  Physician Assistants and Nurse Practitioners) who all work together to provide you with the care you need, when you need it.  Your next appointment:   8 week(s)  Provider:   Donato Heinz, MD  or Coletta Memos, FNP

## 2022-04-10 LAB — CBC
Hematocrit: 42.9 % (ref 37.5–51.0)
Hemoglobin: 14.3 g/dL (ref 13.0–17.7)
MCH: 29.4 pg (ref 26.6–33.0)
MCHC: 33.3 g/dL (ref 31.5–35.7)
MCV: 88 fL (ref 79–97)
Platelets: 152 10*3/uL (ref 150–450)
RBC: 4.86 x10E6/uL (ref 4.14–5.80)
RDW: 11.9 % (ref 11.6–15.4)
WBC: 5.9 10*3/uL (ref 3.4–10.8)

## 2022-04-10 LAB — BASIC METABOLIC PANEL
BUN/Creatinine Ratio: 17 (ref 10–24)
BUN: 18 mg/dL (ref 8–27)
CO2: 23 mmol/L (ref 20–29)
Calcium: 9.8 mg/dL (ref 8.6–10.2)
Chloride: 106 mmol/L (ref 96–106)
Creatinine, Ser: 1.06 mg/dL (ref 0.76–1.27)
Glucose: 99 mg/dL (ref 70–99)
Potassium: 5 mmol/L (ref 3.5–5.2)
Sodium: 145 mmol/L — ABNORMAL HIGH (ref 134–144)
eGFR: 78 mL/min/{1.73_m2} (ref >59)

## 2022-04-10 LAB — TSH: TSH: 2.54 u[IU]/mL (ref 0.450–4.500)

## 2022-04-16 ENCOUNTER — Other Ambulatory Visit: Payer: Self-pay | Admitting: *Deleted

## 2022-04-16 MED ORDER — ROSUVASTATIN CALCIUM 40 MG PO TABS: 40.0000 mg | ORAL_TABLET | Freq: Every day | ORAL | 3 refills | Status: DC

## 2022-04-20 ENCOUNTER — Emergency Department (HOSPITAL_BASED_OUTPATIENT_CLINIC_OR_DEPARTMENT_OTHER)
Admission: EM | Admit: 2022-04-20 | Discharge: 2022-04-20 | Disposition: A | Discharge status: Home / Self Care | Payer: Medicare Other | Attending: Emergency Medicine | Admitting: Emergency Medicine

## 2022-04-20 ENCOUNTER — Encounter (HOSPITAL_BASED_OUTPATIENT_CLINIC_OR_DEPARTMENT_OTHER): Payer: Self-pay | Admitting: Emergency Medicine

## 2022-04-20 ENCOUNTER — Ambulatory Visit (INDEPENDENT_AMBULATORY_CARE_PROVIDER_SITE_OTHER): Payer: Medicare Other | Admitting: Adult Health

## 2022-04-20 ENCOUNTER — Emergency Department (HOSPITAL_BASED_OUTPATIENT_CLINIC_OR_DEPARTMENT_OTHER): Payer: Medicare Other | Admitting: Radiology

## 2022-04-20 ENCOUNTER — Other Ambulatory Visit: Payer: Self-pay

## 2022-04-20 ENCOUNTER — Encounter: Payer: Self-pay | Admitting: Adult Health

## 2022-04-20 ENCOUNTER — Telehealth: Payer: Self-pay | Admitting: Cardiology

## 2022-04-20 DIAGNOSIS — Z7982 Long term (current) use of aspirin: Secondary | ICD-10-CM | POA: Diagnosis not present

## 2022-04-20 DIAGNOSIS — R079 Chest pain, unspecified: Secondary | ICD-10-CM

## 2022-04-20 DIAGNOSIS — I10 Essential (primary) hypertension: Secondary | ICD-10-CM | POA: Insufficient documentation

## 2022-04-20 DIAGNOSIS — F39 Unspecified mood [affective] disorder: Secondary | ICD-10-CM | POA: Diagnosis not present

## 2022-04-20 DIAGNOSIS — Z79899 Other long term (current) drug therapy: Secondary | ICD-10-CM | POA: Insufficient documentation

## 2022-04-20 DIAGNOSIS — R7989 Other specified abnormal findings of blood chemistry: Secondary | ICD-10-CM | POA: Diagnosis not present

## 2022-04-20 DIAGNOSIS — R001 Bradycardia, unspecified: Secondary | ICD-10-CM | POA: Diagnosis not present

## 2022-04-20 DIAGNOSIS — I350 Nonrheumatic aortic (valve) stenosis: Secondary | ICD-10-CM | POA: Diagnosis not present

## 2022-04-20 DIAGNOSIS — I159 Secondary hypertension, unspecified: Secondary | ICD-10-CM

## 2022-04-20 LAB — TROPONIN I (HIGH SENSITIVITY)
Troponin I (High Sensitivity): 22 ng/L — ABNORMAL HIGH (ref <18)
Troponin I (High Sensitivity): 25 ng/L — ABNORMAL HIGH (ref <18)

## 2022-04-20 LAB — BASIC METABOLIC PANEL
Anion gap: 9 (ref 5–15)
BUN: 23 mg/dL (ref 8–23)
CO2: 28 mmol/L (ref 22–32)
Calcium: 10 mg/dL (ref 8.9–10.3)
Chloride: 104 mmol/L (ref 98–111)
Creatinine, Ser: 1.15 mg/dL (ref 0.61–1.24)
GFR, Estimated: >60 mL/min (ref >60)
Glucose, Bld: 83 mg/dL (ref 70–99)
Potassium: 4.5 mmol/L (ref 3.5–5.1)
Sodium: 141 mmol/L (ref 135–145)

## 2022-04-20 LAB — CBC
HCT: 44.7 % (ref 39.0–52.0)
Hemoglobin: 14.3 g/dL (ref 13.0–17.0)
MCH: 28.8 pg (ref 26.0–34.0)
MCHC: 32 g/dL (ref 30.0–36.0)
MCV: 90.1 fL (ref 80.0–100.0)
Platelets: 155 10*3/uL (ref 150–400)
RBC: 4.96 MIL/uL (ref 4.22–5.81)
RDW: 13.1 % (ref 11.5–15.5)
WBC: 6.6 10*3/uL (ref 4.0–10.5)
nRBC: 0 % (ref 0.0–0.2)

## 2022-04-20 NOTE — Progress Notes (Signed)
Thomas [name] [id number] [birth year] 67 y.o.  Subjective:   Patient ID:  Thomas [name] is a 67 y.o. (DOB 11-Jun-1955) male.  Chief Complaint: No chief complaint on file.   HPI Thomas [name] presents to the office today for follow-up of mood disorder.  Referred by therapist Corky Sox.  Describes mood today as "ok". Pleasant. Denies tearfulness. Mood symptoms - denies depression, anxiety and irritability. Mood is consistent. Stating "I'm doing very well". Feels like the Trintellix continues to work well. He and wife are doing well. Stable interest and motivation. Taking medications as prescribed. Working with a Transport planner. Energy levels stable. Active, has regular exercise routine.  Enjoys some usual interests and activities. Married. Lives with wife of 2 years. 2 grown sons. Spending time with family. Appetite adequate. Weight loss 265 pounds.  Sleeps well most nights. Averages 7 to 7.5 hours. Recently diagnosed with sleep apnea. Focus and concentration stable. Completing tasks. Managing aspects of household. 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  Denies SI or HI.  Denies AH or VH. Denies substance use. Denies self harm.  [place] Office Visit from 06/10/2021 in [facility] Total Score 2  PHQ-9 Total Score 12       Review of Systems:  Review of Systems  Musculoskeletal:  Negative for gait problem.  Neurological:  Negative for tremors.  Psychiatric/Behavioral:         Please refer to HPI   Medications: I have reviewed the patient's current medications.  Current Outpatient Medications  Medication Sig Dispense Refill   aspirin EC 81 MG tablet Take 81 mg by mouth daily. Swallow whole.     Cholecalciferol (VITAMIN D) 50 MCG (2000 UT) tablet Take 1 tablet (2,000 Units total) by mouth daily.     ezetimibe (ZETIA) 10 MG tablet Take 1 tablet (10 mg total) by mouth daily. 90 tablet  3   fluticasone (FLONASE) 50 MCG/ACT nasal spray Place 2 sprays into both nostrils daily as needed for allergies or rhinitis.     NEOMYCIN-POLYMYXIN-HYDROCORTISONE (CORTISPORIN) 1 % SOLN OTIC solution Apply 1-2 drops to toe BID after soaking (Patient not taking: Reported on 04/09/2022) 10 mL 1   rosuvastatin (CRESTOR) 40 MG tablet Take 1 tablet (40 mg total) by mouth daily. 90 tablet 3   valsartan-hydrochlorothiazide (DIOVAN-HCT) 320-25 MG tablet Take 1 tablet by mouth daily.     vortioxetine HBr (TRINTELLIX) 20 MG TABS tablet Take 1 tablet (20 mg total) by mouth daily. 30 tablet 0   No current facility-administered medications for this visit.    Medication Side Effects: None  Allergies:  Allergies  Allergen Reactions   Amoxicillin-Pot Clavulanate Other (See Comments) and Diarrhea   Topiramate Other (See Comments)    Past Medical History:  Diagnosis Date   Anemia    hx of in 1992    Anxiety    Arthritis    lower back    Back pain    Blockage of coronary artery of heart (HCC)    Constipation    Generalized muscle weakness    GERD (gastroesophageal reflux disease)    rarely    Hearing loss    History of anxiety    Hyperlipemia    Hypertension    IBS (irritable bowel syndrome)    Joint pain    Lower urinary tract symptoms (LUTS)    Malignant neoplasm of prostate ([place])  04/17/2014   Other fatigue    Pneumonia    hx x2 of pneumonia at age 67    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   Please see review of systems for further details on the patient's review from today.   Objective:   Physical Exam:  There were no vitals taken for this visit.  Physical Exam Constitutional:      General: He is not in acute  distress. Musculoskeletal:        General: No deformity.  Neurological:     Mental Status: He is alert and oriented to person, place, and time.     Coordination: Coordination normal.  Psychiatric:        Attention and Perception: Attention and perception normal. He does not perceive auditory or visual hallucinations.        Mood and Affect: Mood normal. Mood is not anxious or depressed. Affect is not labile, blunt, angry or inappropriate.        Speech: Speech normal.        Behavior: Behavior normal.        Thought Content: Thought content normal. Thought content is not paranoid or delusional. Thought content does not include homicidal or suicidal ideation. Thought content does not include homicidal or suicidal plan.        Cognition and Memory: Cognition and memory normal.        Judgment: Judgment normal.     Comments: Insight intact     Lab Review:     Component Value Date/Time   NA 145 (H) 04/09/2022 1440   K 5.0 04/09/2022 1440   CL 106 04/09/2022 1440   CO2 23 04/09/2022 1440   GLUCOSE 99 04/09/2022 1440   GLUCOSE 95 01/22/2019 1438   BUN 18 04/09/2022 1440   CREATININE 1.06 04/09/2022 1440   CALCIUM 9.8 04/09/2022 1440   PROT 7.4 06/10/2021 1529   ALBUMIN 5.1 (H) 06/10/2021 1529   AST 38 06/10/2021 1529   ALT 46 (H) 06/10/2021 1529   ALKPHOS 76 06/10/2021 1529   BILITOT 0.8 06/10/2021 1529   GFRNONAA 64 02/21/2019 0855   GFRAA 74 02/21/2019 0855       Component Value Date/Time   WBC 5.9 04/09/2022 1440   WBC 6.4 01/22/2019 1438   RBC 4.86 04/09/2022 1440   RBC 5.31 01/22/2019 1438   HGB 14.3 04/09/2022 1440   HCT 42.9 04/09/2022 1440   PLT 152 04/09/2022 1440   MCV 88 04/09/2022 1440   MCH 29.4 04/09/2022 1440   MCH 29.8 01/22/2019 1438   MCHC 33.3 04/09/2022 1440   MCHC 33.1 01/22/2019 1438   RDW 11.9 04/09/2022 1440   LYMPHSABS 1.8 06/10/2021 1529   MONOABS 0.9 05/05/2009 1341   EOSABS 0.1 06/10/2021 1529   BASOSABS 0.1 06/10/2021 1529    No  results found for: "POCLITH", "LITHIUM"   No results found for: "PHENYTOIN", "PHENOBARB", "VALPROATE", "CBMZ"   .res Assessment: Plan:    Plan:  PDMP reviewed  Continue Trintellix '20mg'$  daily  RTC 6 months  Patient advised to contact office with any questions, adverse effects, or acute worsening in signs and symptoms.  Time spent with patient was 10 minutes. Greater than 50% of face to face time with patient  was spent on counseling and coordination of care.    Diagnoses and all orders for this visit:  Mood disorder [facility]     Please see After Visit Summary for patient specific instructions.  Future Appointments  Date Time Provider [place]  04/27/2022  8:00 PM Sueanne Margarita, MD [facility]  06/04/2022  9:15 AM Deberah Pelton, NP [facility] None  10/01/2022  9:35 AM [facility] 1 [id number]    No orders of the defined types were placed in this encounter.   -------------------------------

## 2022-04-20 NOTE — ED Provider Notes (Addendum)
[place] EMERGENCY DEPARTMENT AT [facility] Provider Note   CSN: [id number] Arrival date & time: 04/20/22  1654     History  Chief Complaint  Patient presents with   Chest Pain    Thomas [name] is a 67 y.o. male.   Chest Pain    67 year old male with medical history significant for aortic stenosis, HTN, HLD, anxiety, GERD who presents to the emergency department with chest pain.  The patient states that he developed chest pain described as a dull ache around 5 at hours ago.  He describes some radiation to his left neck, otherwise no radiation.  He denies any ripping or tearing component.  He denies any fever, chills or cough.  He denies any lightheadedness or near syncope or syncope.  Symptoms have eased off since coming to the emergency department.  Home Medications Prior to Admission medications   Medication Sig Start Date End Date Taking? Authorizing Provider  aspirin EC 81 MG tablet Take 81 mg by mouth daily. Swallow whole.    [provider]  Cholecalciferol (VITAMIN D) 50 MCG (2000 UT) tablet Take 1 tablet (2,000 Units total) by mouth daily. 07/28/21   Opalski, Gavin Pound, DO  ezetimibe (ZETIA) 10 MG tablet Take 1 tablet (10 mg total) by mouth daily. 10/17/21 10/12/22  Little Ishikawa, MD  fluticasone (FLONASE) 50 MCG/ACT nasal spray Place 2 sprays into both nostrils daily as needed for allergies or rhinitis.    [provider]  hydrALAZINE (APRESOLINE) 10 MG tablet Take 1 tablet (10 mg total) by mouth 3 (three) times daily as needed (SYSTOLIC BP HIGHER THAN 160). 04/22/22   Ronney Asters, NP  hydrochlorothiazide (HYDRODIURIL) 25 MG tablet Take 1 tablet (25 mg total) by mouth daily. 04/22/22 07/21/22  Ronney Asters, NP  NEOMYCIN-POLYMYXIN-HYDROCORTISONE (CORTISPORIN) 1 % SOLN OTIC solution Apply 1-2 drops to toe BID after soaking 10/07/21   Hyatt, Max T, DPM  olmesartan (BENICAR) 20 MG tablet Take 1 tablet (20 mg total) by mouth daily. 04/22/22    Ronney Asters, NP  rosuvastatin (CRESTOR) 40 MG tablet Take 1 tablet (40 mg total) by mouth daily. 04/16/22   Little Ishikawa, MD  vortioxetine HBr (TRINTELLIX) 20 MG TABS tablet Take 1 tablet (20 mg total) by mouth daily. 12/18/21   Mozingo, Thereasa Solo, NP      Allergies    Amoxicillin-pot clavulanate and Topiramate    Review of Systems   Review of Systems  Cardiovascular:  Positive for chest pain.  All other systems reviewed and are negative.   Physical Exam Updated Vital Signs BP (!) 177/95   Pulse (!) 47   Temp 98.5 F (36.9 C) (Oral)   Resp 13   Ht 5' 10.5" (1.791 m)   Wt 124.4 kg   SpO2 98%   BMI 38.79 kg/m  Physical Exam Vitals and nursing note reviewed.  Constitutional:      General: He is not in acute distress.    Appearance: He is well-developed.  HENT:     Head: Normocephalic and atraumatic.  Eyes:     Conjunctiva/sclera: Conjunctivae normal.  Cardiovascular:     Rate and Rhythm: Normal rate and regular rhythm.     Heart sounds: Murmur heard.     Systolic murmur is present.  Pulmonary:     Effort: Pulmonary effort is normal. No respiratory distress.     Breath sounds: Normal breath sounds.  Abdominal:     Palpations: Abdomen is soft.  Tenderness: There is no abdominal tenderness.  Musculoskeletal:        General: No swelling.     Cervical back: Neck supple.  Skin:    General: Skin is warm and dry.     Capillary Refill: Capillary refill takes less than 2 seconds.  Neurological:     Mental Status: He is alert.  Psychiatric:        Mood and Affect: Mood normal.     ED Results / Procedures / Treatments   Labs (all labs ordered are listed, but only abnormal results are displayed) Labs Reviewed  TROPONIN I (HIGH SENSITIVITY) - Abnormal; Notable for the following components:      Result Value   Troponin I (High Sensitivity) 22 (*)    All other components within normal limits  TROPONIN I (HIGH SENSITIVITY) - Abnormal; Notable for  the following components:   Troponin I (High Sensitivity) 25 (*)    All other components within normal limits  BASIC METABOLIC PANEL  CBC    EKG EKG Interpretation  Date/Time:  Monday April 20 2022 17:11:20 EST Ventricular Rate:  50 PR Interval:  164 QRS Duration: 78 QT Interval:  448 QTC Calculation: 408 R Axis:   8 Text Interpretation: Sinus bradycardia Otherwise normal ECG When compared with ECG of 22-Jan-2019 14:30, Nonspecific T wave abnormality no longer evident in Anterolateral leads Confirmed by Ernie Avena ([id number]) on 04/20/2022 6:56:12 PM  Radiology DG Chest 2 View  Result Date: 04/20/2022 CLINICAL DATA:  Chest pain EXAM: CHEST - 2 VIEW COMPARISON:  Chest x-ray January 22, 2019 FINDINGS: The heart size and mediastinal contours are within normal limits. Both lungs are clear. The visualized skeletal structures are unremarkable. IMPRESSION: No active cardiopulmonary disease. Electronically Signed   By: Gerome Sam III M.D.   On: 04/20/2022 17:45    Procedures Procedures    Medications Ordered in ED Medications - No data to display  ED Course/ Medical Decision Making/ A&P                             Medical Decision Making Amount and/or Complexity of Data Reviewed Labs: ordered. Radiology: ordered.    67 year old male with medical history significant for aortic stenosis, HTN, HLD, anxiety, GERD who presents to the emergency department with chest pain.  The patient states that he developed chest pain described as a dull ache around 5 at hours ago.  He describes some radiation to his left neck, otherwise no radiation.  He denies any ripping or tearing component.  He denies any fever, chills or cough.  He denies any lightheadedness or near syncope or syncope.  Symptoms have eased off since coming to the emergency department.  On arrival, the patient was afebrile, bradycardic heart rate 50, RR 16, hypertensive BP 179/94, saturating 97% on room air.  Physical exam  significant for a systolic murmur which is known to the patient given his known history of aortic stenosis.  The patient states that if he were to become more symptomatic he would be potentially considered for valvular replacement/TAVR.  Differential diagnosis includes ACS, COVID-19, pneumonia, pneumothorax, aortic stenosis/valvular abnormality.  Lower concern for PE.  Initial EKG was performed revealed sinus bradycardia, otherwise unremarkable, no T wave inversions, ST segment changes present.  The chest x-ray performed revealed no active cardiopulmonary disease.  Laboratory evaluation significant for cardiac troponins mildly elevated but flat at 22 and 25, CBC without a leukocytosis or anemia, BMP  unremarkable.  The patient's chest pain has resolved.  Symptoms are most likely consistent with pain in the setting of his aortic stenosis, low concern for ACS given flat troponins. Previously noted to have nonobstructive CAD via coronary CTA 02/23/2019.   No syncope requiring observation in the hospital on telemetry.  Discussed admission for observation versus discharge home and patient is comfortable with plan for discharge.  I discussed the care of the patient with the on-call cardiologist, Dr. Welton Flakes who recommended close outpatient follow-up with his cardiologist given his history of aortic stenosis.  Did not recommend admission given resolution of symptoms at this time.  Discussed the plan of care with the patient who plans to follow-up closely with his cardiologist in clinic later this week.  Overall at this time on repeat assessment, the patient was overall well-appearing, asymptomatic, chest pain-free.  Stable for discharge.  Final Clinical Impression(s) / ED Diagnoses Final diagnoses:  Chest pain, unspecified type  Aortic valve stenosis, etiology of cardiac valve disease unspecified    Rx / DC Orders ED Discharge Orders     None             Ernie Avena, MD 04/22/22 1529

## 2022-04-20 NOTE — Telephone Encounter (Signed)
Pt c/o of Chest Pain: STAT if CP now or developed within 24 hours  1. Are you having CP right now?   Yes, since 12 noon today  2. Are you experiencing any other symptoms (ex. SOB, nausea, vomiting, sweating)?   No  3. How long have you been experiencing CP? Started today around 12 noon  4. Is your CP continuous or coming and going?   Continuous  5. Have you taken Nitroglycerin?   No.  Does not have any  Patient stated on a scale of 1-10 the discomfort is about a 2. ?

## 2022-04-20 NOTE — Telephone Encounter (Signed)
Received call from schedulers, spoke with patient- he states he is currently having chest pain, he states he has been having this feeling in his chest since lunch time with no improvement- he states he is not currently experiencing any other symptoms, HR is in the 40's, however, he denies any improvement of the symptoms- it does not worsen or get better with deep breathing. It has been a nagging pain for the last few hours. I did advise that with active chest discomfort/pain- we would recommend to be seen in the ED- patient states he will go to [facility] for evaluation. He will go now. I advised I would send to MD for FYI. Thanks!

## 2022-04-20 NOTE — ED Triage Notes (Signed)
Pt arrives to ED with c/o chest pain. The CP started x5 hours ago. The pain is a constant dull ache. He notes slight radiation to left neck.

## 2022-04-20 NOTE — Discharge Instructions (Signed)
Your cardiac enzymes were mildly elevated but flat.  Your chest pain did not recur in the emergency department over the course of over 5-1/2 hours.  Recommend you call your cardiologist tomorrow morning to schedule follow-up.  Return for any recurrence of symptoms.  Recommend avoidance of heavy exertion in the interim.  Return for any syncope, severe shortness of breath as well.

## 2022-04-21 NOTE — Progress Notes (Deleted)
Cardiology Clinic Note   Patient Name: Thomas [name] Date of Encounter: 04/21/2022  Primary Care Provider:  Kathalene Frames, MD Primary Cardiologist:  Donato Heinz, MD  Patient Profile    Thomas [name] 67 year old male presents to the clinic today for  evaluation of his low heart rate.  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   Shortness of breath on exertion    Ventral hernia without obstruction or gangrene 03/27/2016   Vitamin D deficiency    Past Surgical History:  Procedure Laterality Date   Epididymis Excision Left    Spermocele   HEMORROIDECTOMY     HERNIA REPAIR Bilateral 1967 and 1986   x2 open inguinal hernia repairs   INCISION AND DRAINAGE OF WOUND  2017   Wound Infection Post OP   LYMPHADENECTOMY Bilateral 05/17/2014   Procedure: PELVIC LYMPHADENECTOMY;  Surgeon: Raynelle Bring, MD;  Location: [facility];  Service: Urology;  Laterality: Bilateral;   PROSTATE BIOPSY  03/22/14   Raynelle Bring   ROBOT ASSISTED LAPAROSCOPIC RADICAL PROSTATECTOMY N/A 05/17/2014   Procedure: ROBOTIC ASSISTED LAPAROSCOPIC RADICAL PROSTATECTOMY LEVEL 2;  Surgeon: Raynelle Bring, MD;  Location: [facility];  Service: Urology;  Laterality: N/A;   SKIN TAG REMOVAL     15 lesions   VASECTOMY     VENTRAL HERNIA REPAIR N/A 03/27/2016   Procedure: LAPAROSCOPIC VENTRAL HERNIA;  Surgeon: Alphonsa Overall, MD;  Location:  [facility];  Service: General;  Laterality: N/A;  WITH MESH    Allergies  Allergies  Allergen Reactions   Amoxicillin-Pot Clavulanate Other (See Comments) and Diarrhea   Topiramate Other (See Comments)    History of Present Illness    Thomas [name] has a PMH of coronary artery disease, HTN, HLD, aortic stenosis, obesity.  His echocardiogram 01/27/2019 showed an EF of 60-65%, moderate LVH, G1 DD, normal RV function, mild-moderate aortic stenosis.  His coronary CTA 12/20 showed nonobstructive CAD and a calcium score of 113.  He wore a cardiac event monitor x 6 days 3/22 which showed 1 episode of NSVT lasting 4 beats, frequent PVCs (6.3%).  Echocardiogram 05/28/2021 showed an EF of 65-70%, moderate LVH, G1 DD, normal RV function, severe left atrial enlargement, moderate aortic stenosis.  His echocardiogram 03/30/2022 showed an EF of 60-65%, G1 DD, normal RV function, moderate-severe aortic stenosis.  He was seen in follow-up by Dr. Gardiner Rhyme on 04/02/2022.  During that time he reported he was doing well.  He denied chest pain and dyspnea.  He stated that he was very active and was working 12-14 hours on his house at a time.  He reported occasional lightheadedness with standing.  He denied any syncope.  He reported that he had lost around 20 pounds over the course of a year.  He contacted the nurse triage line 04/08/2022 and reported that his heart rate was 59.  He had been monitoring his heart rate and noted heart rates in the 40s and as low as 36.  He denied symptoms.  He had been receiving notifications for low pulse on his iWatch.  He reported concern due to his recent diagnosis of aortic stenosis.  He was added to my schedule today.  He presents to the clinic today for evaluation and states since he was seen in the office earlier in the month he has noticed several nights of low heart rate.  He reports that he typically does not wear his watch at night and has just started doing so.  He has noted heart  rates below 50 while sleeping.  He denies episodes of dizziness.  He reports that he has increased his physical activity and actually feels better than he has in a long time.  He is newly retired and is now working at [employer].  We reviewed his recent echocardiogram and previous coronary CT.  He expressed understanding.  His EKG today shows sinus bradycardia with possible premature atrial complexes and aberrant conduction 56 bpm.  I will order CBC, BMP, TSH 7-day cardiac event monitor and plan follow-up in 8 weeks.  Today denies chest pain, shortness of breath, lower extremity edema, fatigue, palpitations, melena, hematuria, hemoptysis, diaphoresis, weakness, presyncope, syncope, orthopnea, and PND.   Home Medications    Prior to Admission medications   Medication Sig Start Date End Date Taking? Authorizing Provider  aspirin EC 81 MG tablet Take 81 mg by mouth daily. Swallow whole.    [provider]  Cholecalciferol (VITAMIN D) 50 MCG (2000 UT) tablet Take 1 tablet (2,000 Units total) by mouth daily. 07/28/21   Opalski, Neoma Laming, DO  ezetimibe (ZETIA) 10 MG tablet Take 1 tablet (10 mg total) by mouth daily. 10/17/21 10/12/22  Donato Heinz, MD  fluticasone (FLONASE) 50 MCG/ACT nasal spray Place 2 sprays into both nostrils daily as needed for allergies or rhinitis.    [provider]  NEOMYCIN-POLYMYXIN-HYDROCORTISONE (CORTISPORIN) 1 % SOLN OTIC solution Apply 1-2 drops to toe BID after soaking 10/07/21   Hyatt, Max T, DPM  rosuvastatin (CRESTOR) 20 MG tablet Take 40 mg by mouth daily.    [provider]  valsartan-hydrochlorothiazide (DIOVAN-HCT) 320-25 MG tablet Take 1 tablet by mouth daily. 10/21/21   [provider]  vortioxetine HBr (TRINTELLIX) 20 MG TABS tablet Take 1 tablet (20 mg total) by mouth daily. 12/18/21   Mozingo, Berdie Ogren, NP    Family History    Family History  Problem Relation Age of Onset   Eating disorder Mother     Alcohol abuse Mother    Bipolar disorder Mother    Anxiety disorder Mother    Depression Mother    Kidney disease Mother    Heart disease Mother    Hyperlipidemia Mother    Hypertension Mother    Heart attack Mother    Cancer Mother    COPD Mother    Ovarian cancer Mother    Heart failure Mother    Drug abuse Father    Cancer Father    Heart attack Paternal Grandfather    Heart disease Other    Kidney disease Other    Depression Other    Anxiety disorder Other    Bipolar disorder Other  Alcohol abuse Other    Drug abuse Other    Eating disorder Other    He indicated that his mother is deceased. He indicated that the status of his father is unknown. He indicated that the status of his paternal grandfather is unknown. He indicated that the status of his other is unknown.  Social History    Social History   Socioeconomic History   Marital status: Married    Spouse name: [name]   Number of children: 2   Years of education: Not on file   Highest education level: Not on file  Occupational History   Occupation: Retired  Tobacco Use   Smoking status: Never   Smokeless tobacco: Never  Vaping Use   Vaping Use: Never used  Substance and Sexual Activity   Alcohol use: No    Alcohol/week: 0.0 standard drinks of alcohol   Drug use: No   Sexual activity: Yes    Partners: Male  Other Topics Concern   Not on file  Social History Narrative   Not on file   Social Determinants of Health   Financial Resource Strain: Not on file  Food Insecurity: Not on file  Transportation Needs: Not on file  Physical Activity: Not on file  Stress: Not on file  Social Connections: Not on file  Intimate Partner Violence: Not on file     Review of Systems    General:  No chills, fever, night sweats or weight changes.  Cardiovascular:  No chest pain, dyspnea on exertion, edema, orthopnea, palpitations, paroxysmal nocturnal dyspnea. Dermatological: No rash,  lesions/masses Respiratory: No cough, dyspnea Urologic: No hematuria, dysuria Abdominal:   No nausea, vomiting, diarrhea, bright red blood per rectum, melena, or hematemesis Neurologic:  No visual changes, wkns, changes in mental status. All other systems reviewed and are otherwise negative except as noted above.  Physical Exam    VS:  There were no vitals taken for this visit. , BMI There is no height or weight on file to calculate BMI. GEN: Well nourished, well developed, in no acute distress. HEENT: normal. Neck: Supple, no JVD, carotid bruits, or masses. Cardiac: RRR, 3/6 systolic murmur heard at apex, rubs, or gallops. No clubbing, cyanosis, edema.  Radials/DP/PT 2+ and equal bilaterally.  Respiratory:  Respirations regular and unlabored, clear to auscultation bilaterally. GI: Soft, nontender, nondistended, BS + x 4. MS: no deformity or atrophy. Skin: warm and dry, no rash. Neuro:  Strength and sensation are intact. Psych: Normal affect.  Accessory Clinical Findings    Recent Labs: 06/10/2021: ALT 46 01/08/2022: Magnesium 2.2 04/09/2022: TSH 2.540 04/20/2022: BUN 23; Creatinine, Ser 1.15; Hemoglobin 14.3; Platelets 155; Potassium 4.5; Sodium 141   Recent Lipid Panel    Component Value Date/Time   CHOL 105 01/08/2022 1034   TRIG 83 01/08/2022 1034   HDL 40 01/08/2022 1034   CHOLHDL 2.6 01/08/2022 1034   LDLCALC 48 01/08/2022 1034         ECG personally reviewed by me today-sinus bradycardia with possible premature atrial complexes with apparent conduction 56 bpm- No acute changes  Assessment & Plan   1.  Low heart rate-EKG today showssinus bradycardia with possible premature atrial complexes with apparent conduction 56 bpm.  Reports that his iWatch has been notifying him of low heart rates less than 50.  He reports that his heart rate has been as low as 36.  He denied symptoms.  Avoid AV nodal blocking agents. Patient reassured that his blood pressure has remained  stable.  Discussed physiology of heart rate at rest and with increased fitness. Order 7-day cardiac event monitor Order CBC, BMP, TSH, free T4  Aortic stenosis-no increased DOE or activity intolerance.  Echocardiogram 03/30/2022 showed 60-65% EF, G1 DD, normal RV function, moderate-severe aortic stenosis.  Reviewed results. Continue current medical therapy Continue current physical activity Plan for repeat echocardiogram annually  Coronary artery disease-no chest pain today.  Was noted to have nonobstructive CAD via coronary CTA 02/23/2019. Continue heart healthy low-sodium high-fiber diet Maintain physical activity Continue rosuvastatin and ezetimibe  Essential hypertension-BP today 130/74.  Does report occasional episodes of lightheadedness. Continue valsartan, HCTZ  OSA-reports compliance with CPAP.  Waking up well rested. Continue CPAP use  Disposition: Follow-up with Dr. Gardiner Rhyme or me after cardiac event monitor.   Jossie Ng. Davione Lenker NP-C     04/21/2022, 12:24 PM [facility] [address] Office [phone] Fax [phone]    I spent 15 minutes examining this patient, reviewing medications, and using patient centered shared decision making involving her cardiac care.  Prior to her visit I spent greater than 20 minutes reviewing her past medical history,  medications, and prior cardiac tests.

## 2022-04-21 NOTE — Telephone Encounter (Signed)
ED notes not available yet but looks like was discharged from ED, troponins mildly elevated but flat.  Chest pain could be due to his AS.  Can we schedule appointment next week to discuss?

## 2022-04-22 ENCOUNTER — Ambulatory Visit: Payer: Medicare Other | Attending: General Practice | Admitting: General Practice

## 2022-04-22 ENCOUNTER — Encounter: Payer: Self-pay | Admitting: General Practice

## 2022-04-22 VITALS — BP 162/84 | HR 53 | Ht 71.0 in | Wt 272.8 lb

## 2022-04-22 DIAGNOSIS — I2510 Atherosclerotic heart disease of native coronary artery without angina pectoris: Secondary | ICD-10-CM | POA: Diagnosis present

## 2022-04-22 DIAGNOSIS — I350 Nonrheumatic aortic (valve) stenosis: Secondary | ICD-10-CM | POA: Diagnosis present

## 2022-04-22 DIAGNOSIS — R001 Bradycardia, unspecified: Secondary | ICD-10-CM | POA: Insufficient documentation

## 2022-04-22 DIAGNOSIS — I10 Essential (primary) hypertension: Secondary | ICD-10-CM | POA: Diagnosis present

## 2022-04-22 DIAGNOSIS — R079 Chest pain, unspecified: Secondary | ICD-10-CM | POA: Diagnosis present

## 2022-04-22 DIAGNOSIS — G4733 Obstructive sleep apnea (adult) (pediatric): Secondary | ICD-10-CM | POA: Diagnosis present

## 2022-04-22 MED ORDER — OLMESARTAN MEDOXOMIL 20 MG PO TABS: 20.0000 mg | ORAL_TABLET | Freq: Every day | ORAL | 2 refills | Status: DC

## 2022-04-22 MED ORDER — HYDRALAZINE HCL 10 MG PO TABS: 10.0000 mg | ORAL_TABLET | Freq: Three times a day (TID) | ORAL | 0 refills | Status: DC | PRN

## 2022-04-22 MED ORDER — HYDROCHLOROTHIAZIDE 25 MG PO TABS: 25.0000 mg | ORAL_TABLET | Freq: Every day | ORAL | 3 refills | Status: DC

## 2022-04-22 NOTE — Progress Notes (Signed)
Cardiology Clinic Note   Patient Name: Thomas [name] Date of Encounter: 04/22/2022  Primary Care Provider:  Kathalene Frames, MD Primary Cardiologist:  Donato Heinz, MD  Patient Profile    Thomas [name] 67 year old male presents to the clinic today for  evaluation of his low heart rate, hypertension, and for ED follow-up.   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   Shortness of breath on exertion    Ventral hernia without obstruction or gangrene 03/27/2016   Vitamin D deficiency    Past Surgical History:  Procedure Laterality Date   Epididymis Excision Left    Spermocele   HEMORROIDECTOMY     HERNIA REPAIR Bilateral 1967 and 1986   x2 open inguinal hernia repairs   INCISION AND DRAINAGE OF WOUND  2017   Wound Infection Post OP   LYMPHADENECTOMY Bilateral 05/17/2014   Procedure: PELVIC LYMPHADENECTOMY;  Surgeon: Raynelle Bring, MD;  Location: [facility];  Service: Urology;  Laterality: Bilateral;   PROSTATE BIOPSY  03/22/14   Raynelle Bring   ROBOT ASSISTED LAPAROSCOPIC RADICAL PROSTATECTOMY N/A 05/17/2014   Procedure: ROBOTIC ASSISTED LAPAROSCOPIC RADICAL PROSTATECTOMY LEVEL 2;  Surgeon: Raynelle Bring, MD;  Location: [facility];  Service: Urology;  Laterality: N/A;   SKIN TAG REMOVAL     15 lesions   VASECTOMY     VENTRAL HERNIA REPAIR N/A 03/27/2016   Procedure: LAPAROSCOPIC VENTRAL HERNIA;   Surgeon: Alphonsa Overall, MD;  Location: [facility];  Service: General;  Laterality: N/A;  WITH MESH    Allergies  Allergies  Allergen Reactions   Amoxicillin-Pot Clavulanate Other (See Comments) and Diarrhea   Topiramate Other (See Comments)    History of Present Illness    [name] has a PMH of coronary artery disease, HTN, HLD, aortic stenosis, obesity.  His echocardiogram 01/27/2019 showed an EF of 60-65%, moderate LVH, G1 DD, normal RV function, mild-moderate aortic stenosis.  His coronary CTA 12/20 showed nonobstructive CAD and a calcium score of 113.  He wore a cardiac event monitor x 6 days 3/22 which showed 1 episode of NSVT lasting 4 beats, frequent PVCs (6.3%).  Echocardiogram 05/28/2021 showed an EF of 65-70%, moderate LVH, G1 DD, normal RV function, severe left atrial enlargement, moderate aortic stenosis.  His echocardiogram 03/30/2022 showed an EF of 60-65%, G1 DD, normal RV function, moderate-severe aortic stenosis.   He was seen in follow-up by Dr. Gardiner Rhyme on 04/02/2022.  During that time he reported he was doing well.  He denied chest pain and dyspnea.  He stated that he was very active and was working 12-14 hours on his house at a time.  He reported occasional lightheadedness  with standing.  He denied any syncope.  He reported that he had lost around 20 pounds over the course of a year.   He contacted the nurse triage line 04/08/2022 and reported that his heart rate was 59.  He had been monitoring his heart rate and noted heart rates in the 40s and as low as 36.  He denied symptoms.  He had been receiving notifications for low pulse on his iWatch.  He reported concern due to his recent diagnosis of aortic stenosis.  He was added to my schedule today.   He presented to the clinic 04/09/22 for evaluation and stated since he was seen in the office earlier in the month he had noticed several nights of low heart rate.  He reported that he typically did not wear his watch at night and had  just started doing so.  He had noted heart rates below 50 while sleeping.  He denies episodes of dizziness.  He reports that he has increased his physical activity and actually feels better than he has in a long time.  He was newly retired and was  working at [employer].  We reviewed his recent echocardiogram and previous coronary CT.  He expressed understanding.  His EKG  showed sinus bradycardia with possible premature atrial complexes and aberrant conduction 56 bpm.  I ordered CBC, BMP, TSH 7-day cardiac event monitor and plan follow-up in 8 weeks.   He was seen in the emergency department on 04/20/2022.  He reported chest discomfort his high-sensitivity troponins were 22 and 25.  His chest discomfort resolved.  He was instructed to follow-up with cardiology.   He contacted the nurse triage line 04/22/2022 and reported elevated blood pressure since being seen in the emergency department.  He reported blood pressures in the [id number] systolic.  He presents to the clinic today for follow-up evaluation and states he has some worry about his blood pressure and his girlfriend's upcoming MRIs.  We reviewed his emergency department visit and echocardiogram.  We discussed his medications and elevated blood pressure.  Case was discussed with Dr. Gardiner Rhyme.  I will refer to structural heart team for further evaluation of aortic stenosis.  His blood pressure initially today is 184/98 and on recheck is 162/84.  His blood pressure has been elevated since recent visit in the emergency department.  This appears to be in part related to anxiety.  He has not had any other changes to his diet medications.  I will discontinue his diet.  Prescribed HCTZ 25, olmesartan 20, and hydralazine 10 mg 3 times daily for systolic blood pressure over 160.  Will have a BMP drawn in 1 week right and have him blood pressure log.  I will have him follow-up in 1 week.    Today denies chest pain, shortness of breath, lower extremity  edema, fatigue, palpitations, melena, hematuria, hemoptysis, diaphoresis, weakness, presyncope, syncope, orthopnea, and PND.   Home Medications    Prior to Admission medications   Medication Sig Start Date End Date Taking? Authorizing Provider  aspirin EC 81 MG tablet Take 81 mg by mouth daily. Swallow whole.    [provider]  Cholecalciferol (VITAMIN D) 50 MCG (2000 UT) tablet Take 1 tablet (2,000 Units total) by mouth daily. 07/28/21   Opalski, Neoma Laming, DO  ezetimibe (ZETIA) 10 MG tablet Take 1 tablet (10 mg total) by mouth daily. 10/17/21 10/12/22  Donato Heinz, MD  fluticasone (FLONASE) 50 MCG/ACT nasal spray Place 2 sprays into both nostrils  daily as needed for allergies or rhinitis.    [provider]  NEOMYCIN-POLYMYXIN-HYDROCORTISONE (CORTISPORIN) 1 % SOLN OTIC solution Apply 1-2 drops to toe BID after soaking Patient not taking: Reported on 04/09/2022 10/07/21   Hyatt, Max T, DPM  rosuvastatin (CRESTOR) 40 MG tablet Take 1 tablet (40 mg total) by mouth daily. 04/16/22   Donato Heinz, MD  valsartan-hydrochlorothiazide (DIOVAN-HCT) 320-25 MG tablet Take 1 tablet by mouth daily. 10/21/21   [provider]  vortioxetine HBr (TRINTELLIX) 20 MG TABS tablet Take 1 tablet (20 mg total) by mouth daily. 12/18/21   Mozingo, Berdie Ogren, NP    Family History    Family History  Problem Relation Age of Onset   Eating disorder Mother    Alcohol abuse Mother    Bipolar disorder Mother    Anxiety disorder Mother    Depression Mother    Kidney disease Mother    Heart disease Mother    Hyperlipidemia Mother    Hypertension Mother    Heart attack Mother    Cancer Mother    COPD Mother    Ovarian cancer Mother    Heart failure Mother    Drug abuse Father    Cancer Father    Heart attack Paternal Grandfather    Heart disease Other    Kidney disease Other    Depression Other    Anxiety disorder Other    Bipolar disorder Other    Alcohol abuse  Other    Drug abuse Other    Eating disorder Other    He indicated that his mother is deceased. He indicated that the status of his father is unknown. He indicated that the status of his paternal grandfather is unknown. He indicated that the status of his other is unknown.  Social History    Social History   Socioeconomic History   Marital status: Married    Spouse name: [name]   Number of children: 2   Years of education: Not on file   Highest education level: Not on file  Occupational History   Occupation: Retired  Tobacco Use   Smoking status: Never   Smokeless tobacco: Never  Vaping Use   Vaping Use: Never used  Substance and Sexual Activity   Alcohol use: No    Alcohol/week: 0.0 standard drinks of alcohol   Drug use: No   Sexual activity: Yes    Partners: Male  Other Topics Concern   Not on file  Social History Narrative   Not on file   Social Determinants of Health   Financial Resource Strain: Not on file  Food Insecurity: Not on file  Transportation Needs: Not on file  Physical Activity: Not on file  Stress: Not on file  Social Connections: Not on file  Intimate Partner Violence: Not on file     Review of Systems    General:  No chills, fever, night sweats or weight changes.  Cardiovascular:  No chest pain, dyspnea on exertion, edema, orthopnea, palpitations, paroxysmal nocturnal dyspnea. Dermatological: No rash, lesions/masses Respiratory: No cough, dyspnea Urologic: No hematuria, dysuria Abdominal:   No nausea, vomiting, diarrhea, bright red blood per rectum, melena, or hematemesis Neurologic:  No visual changes, wkns, changes in mental status. All other systems reviewed and are otherwise negative except as noted above.  Physical Exam    VS:  BP (!) 162/84   Pulse (!) 53   Ht '5\' 11"'$  (1.803 m)   Wt 272 lb 12.8 oz (123.7 kg)  SpO2 97%   BMI 38.05 kg/m  , BMI Body mass index is 38.05 kg/m. GEN: Well nourished, well developed, in no acute  distress. HEENT: normal. Neck: Supple, no JVD, carotid bruits, or masses. Cardiac: RRR,  3/6 systolic murmur heard at apex , rubs, or gallops. No clubbing, cyanosis, edema.  Radials/DP/PT 2+ and equal bilaterally.  Respiratory:  Respirations regular and unlabored, clear to auscultation bilaterally. GI: Soft, nontender, nondistended, BS + x 4. MS: no deformity or atrophy. Skin: warm and dry, no rash. Neuro:  Strength and sensation are intact. Psych: Normal affect.  Accessory Clinical Findings    Recent Labs: 06/10/2021: ALT 46 01/08/2022: Magnesium 2.2 04/09/2022: TSH 2.540 04/20/2022: BUN 23; Creatinine, Ser 1.15; Hemoglobin 14.3; Platelets 155; Potassium 4.5; Sodium 141   Recent Lipid Panel    Component Value Date/Time   CHOL 105 01/08/2022 1034   TRIG 83 01/08/2022 1034   HDL 40 01/08/2022 1034   CHOLHDL 2.6 01/08/2022 1034   LDLCALC 48 01/08/2022 1034    HYPERTENSION CONTROL Vitals:   04/22/22 1424 04/22/22 1455  BP: (!) 184/98 (!) 162/84    The patient's blood pressure is elevated above target today.  In order to address the patient's elevated BP: Blood pressure will be monitored at home to determine if medication changes need to be made.; A new medication was prescribed today.       ECG personally reviewed by me today-none today.  EKG 04/09/2022 sinus bradycardia with possible premature atrial complexes with apparent conduction 56 bpm- No acute changes   Assessment & Plan   1.  Essential hypertension-BP today 184/98 on recheck 162/84.  Since being seen in the emergency department he reports blood pressures in the 353-299 systolic range.  Elevated pressure appears to be related to anxiety. Continue  HCTZ Olmesartan 20 Start hydralazine 10 mg 3 times daily as needed for blood pressure greater than 242 systolic  BMP in 1 week but will maintain blood pressure log   Aortic stenosis-breathing stable and denies DOE.Marland Kitchen  Echocardiogram 03/30/2022 showed 60-65% EF, G1 DD,  normal RV function, moderate-severe aortic stenosis.  Reviewed echocardiogram Discuss case with Dr. Gardiner Rhyme  To structural heart team for further aortic stenosis evaluation  Chest discomfort-was seen in the emergency department on 04/20/2022.  Cardiac enzymes 22 and 25.    He was discharged home and instructed to follow-up with cardiology.  Pathophysiology of cardiac enzymes reviewed.  Patient reassured that he was not having coronary obstruction.  Chest discomfort appears to be related to anxiety. Follows with Mozingo, NP   Coronary artery disease-no chest pain today.  Previously noted to have nonobstructive CAD via coronary CTA 02/23/2019. Continue heart healthy low-sodium high-fiber diet Maintain physical activity     Low heart rate-heart rate today 53 bpm.  No longer wearing iWatch at night.    He denied symptoms.  Avoid AV nodal blocking agents. Patient reassured that his blood pressure has remained stable.  Discussed physiology of heart rate at rest and with increased fitness. Waiting on results from cardiac event monitor    OSA-continues compliance with CPAP.  Waking up well rested.  Scheduled for repeat sleep study Monday night. Continue CPAP use Weight loss encouraged   Disposition: Follow-up with Dr. Gardiner Rhyme or me in 1 week.   Jossie Ng. Emonii Wienke NP-C     04/22/2022, 3:22 PM [facility] [address] Office [phone] Fax [phone]    I spent 20 minutes examining this patient, reviewing medications,  and using patient centered shared decision making involving her cardiac care.  Prior to her visit I spent greater than 20 minutes reviewing her past medical history,  medications, and prior cardiac tests.

## 2022-04-22 NOTE — Addendum Note (Signed)
Addended by: Claude Manges on: 04/22/2022 04:00 PM   Modules accepted: Orders

## 2022-04-22 NOTE — Telephone Encounter (Signed)
Thomas [name], he has severe AS but has been asymptomatic.  If now having chest pain, would suspect it is his valve.  Would recommend scheduling for [facility] and appointment with valve clinic

## 2022-04-22 NOTE — Patient Instructions (Addendum)
Medication Instructions:   START TAKING:   1. HCTZ 25 MG ONCE A DAY   2. OLMESARTAN  20 MG ONCE A DAY   3. HYDRALAZINE  10 MG THREE TIMES A DAY AS NEEDED FOR SYSTOLIC BLOOD PRESSURE TOP NUMBER OVER 160   STOP TAKING AND REMOVE THIS MEDICATION FROM YOUR MEDICATION LIST:  DIOVAN  *If you need a refill on your cardiac medications before your next appointment, please call your pharmacy*   Lab Work:  RETURN FOR LABS IN [place] BMET    If you have labs (blood work) drawn today and your tests are completely normal, you will receive your results only by: [place] (if you have MyChart) OR A paper copy in the mail If you have any lab test that is abnormal or we need to change your treatment, we will call you to review the results.   Testing/Procedures: NONE ORDERED  TODAY   Follow-Up: At [facility], you and your health needs are our priority.  As part of our continuing mission to provide you with exceptional heart care, we have created designated Provider Care Teams.  These Care Teams include your primary Cardiologist (physician) and Advanced Practice Providers (APPs -  Physician Assistants and Nurse Practitioners) who all work together to provide you with the care you need, when you need it.  We recommend signing up for the patient portal called "MyChart".  Sign up information is provided on this After Visit Summary.  MyChart is used to connect with patients for Virtual Visits (Telemedicine).  Patients are able to view lab/test results, encounter notes, upcoming appointments, etc.  Non-urgent messages can be sent to your provider as well.   To learn more about what you can do with MyChart, go to [email or web address].    Your next appointment:   You have been referred to  DR COOPER FOR [facility]  [facility]   1 week(s)  Provider:    Coletta Memos, FNP   Other Instructions

## 2022-04-22 NOTE — Telephone Encounter (Signed)
Spoke to patient, patient reports pain has resolved but BP continues to be elevated 180-190s/90s and prior to Monday was "normal".    R/S appt with Thomasene Mohair.  Appt today at 2:20 pm.

## 2022-04-23 ENCOUNTER — Ambulatory Visit: Payer: Medicare Other | Admitting: General Practice

## 2022-04-24 ENCOUNTER — Telehealth: Payer: Self-pay | Admitting: Cardiology

## 2022-04-24 NOTE — Telephone Encounter (Signed)
Spoke with patient who has concerns for BP of 175/84. He has taken one extra dose of hydralazine 10 mg today. Reviewed hydralazine order with patient and advised to take 3 times daily as long as systolic pressure is 0000000 or >.   Patient verbalized understanding and states he is recording his BP to bring to next appointment on 04/28/22.

## 2022-04-24 NOTE — Telephone Encounter (Signed)
Pt c/o BP issue: STAT if pt c/o blurred vision, one-sided weakness or slurred speech  1. What are your last 5 BP readings?  Monday 195/95 175/85 - average of what it has been   2. Are you having any other symptoms (ex. Dizziness, headache, blurred vision, passed out)? No   3. What is your BP issue? Patient states that 160 was the benchmark for his bp, but he states the even with the extra pill given, it does not seem to be going under the 175/85 average and would like a call back to discuss.

## 2022-04-27 ENCOUNTER — Ambulatory Visit (HOSPITAL_BASED_OUTPATIENT_CLINIC_OR_DEPARTMENT_OTHER): Payer: Medicare Other | Attending: Cardiology | Admitting: Cardiology

## 2022-04-27 VITALS — Ht 71.0 in | Wt 265.0 lb

## 2022-04-27 DIAGNOSIS — I10 Essential (primary) hypertension: Secondary | ICD-10-CM | POA: Insufficient documentation

## 2022-04-27 DIAGNOSIS — G4733 Obstructive sleep apnea (adult) (pediatric): Secondary | ICD-10-CM | POA: Diagnosis not present

## 2022-04-27 DIAGNOSIS — G4736 Sleep related hypoventilation in conditions classified elsewhere: Secondary | ICD-10-CM | POA: Insufficient documentation

## 2022-04-27 NOTE — Progress Notes (Signed)
[facility] Consult Note  Chief Complaint  Patient presents with   New Patient (Initial Visit)    Aortic stenosis   History of Present Illness: 67 yo male with history of anxiety, HTN, hyperlipidemia, prostate cancer, CAD and moderate to severe aortic stenosis who is here today as a new consult, referred by Dr. Bjorn Pippin, for further discussion regarding his aortic stenosis and possible TAVR. He has moderate CAD by coronary CTA in 2020. (50% mid LAD, less than 50% proximal Circumflex). Echo 03/30/22 with LVEF=60-65%. Moderate LVH. Moderate to severe aortic stenosis with mean gradient 33.5 mmHg, AVA 0.88 cm2, DI 0.24, SVI 34. He had prostate cancer and had a prostatectomy.   He tells me today that he has been feeling well. He has no dyspnea, dizziness, fatigue or LE edema. Rare chest pains. There was mild chest pressure last week when his SBP was over 190. He lives in [place] with his wife. 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 He has no active dental issues.   Primary Care Physician: Emilio Aspen, MD Primary Cardiologist: Bjorn Pippin Referring Cardiologist: Bjorn Pippin  Past Medical History:  Diagnosis Date   Anemia    hx of in 1992    Anxiety    Arthritis    lower back    Back pain    Blockage of coronary artery of heart (HCC)    Constipation    Generalized muscle weakness    GERD (gastroesophageal reflux disease)    rarely    Hearing loss    History of anxiety    Hyperlipemia    Hypertension    IBS (irritable bowel syndrome)    Joint pain    Lower urinary tract symptoms (LUTS)    Malignant neoplasm of prostate (HCC) 04/17/2014   Other fatigue    Pneumonia    hx x2 of pneumonia at age 68    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   Shortness of breath on exertion    Ventral hernia without obstruction or gangrene 03/27/2016   Vitamin D deficiency     Past Surgical History:  Procedure  Laterality Date   Epididymis Excision Left    Spermocele   HEMORROIDECTOMY     HERNIA REPAIR Bilateral 1967 and 1986   x2 open inguinal hernia repairs   INCISION AND DRAINAGE OF WOUND  2017   Wound Infection Post OP   LYMPHADENECTOMY Bilateral 05/17/2014   Procedure: PELVIC LYMPHADENECTOMY;  Surgeon: Heloise Purpura, MD;  Location: [facility];  Service: Urology;  Laterality: Bilateral;   PROSTATE BIOPSY  03/22/14   Heloise Purpura   ROBOT ASSISTED LAPAROSCOPIC RADICAL PROSTATECTOMY N/A 05/17/2014   Procedure: ROBOTIC ASSISTED LAPAROSCOPIC RADICAL PROSTATECTOMY LEVEL 2;  Surgeon: Heloise Purpura, MD;  Location: [facility];  Service: Urology;  Laterality: N/A;   SKIN TAG REMOVAL     15 lesions   VASECTOMY     VENTRAL HERNIA REPAIR N/A 03/27/2016   Procedure: LAPAROSCOPIC VENTRAL HERNIA;  Surgeon: Ovidio Kin, MD;  Location: [facility];  Service: General;  Laterality: N/A;  WITH MESH    Current Outpatient Medications  Medication Sig Dispense Refill   aspirin EC 81 MG tablet Take 81 mg by mouth daily. Swallow whole.     Cholecalciferol (VITAMIN D) 50 MCG (2000 UT) tablet Take 1 tablet (2,000 Units total) by mouth daily.     ezetimibe (ZETIA) 10 MG tablet Take 1 tablet (10 mg total) by  mouth daily. 90 tablet 3   fluticasone (FLONASE) 50 MCG/ACT nasal spray Place 2 sprays into both nostrils daily as needed for allergies or rhinitis.     hydrALAZINE (APRESOLINE) 10 MG tablet Take 1 tablet (10 mg total) by mouth 3 (three) times daily. 270 tablet 3   hydrochlorothiazide (HYDRODIURIL) 25 MG tablet Take 1 tablet (25 mg total) by mouth daily. 90 tablet 3   olmesartan (BENICAR) 20 MG tablet Take 1 tablet (20 mg total) by mouth daily. 90 tablet 2   rosuvastatin (CRESTOR) 40 MG tablet Take 1 tablet (40 mg total) by mouth daily. 90 tablet 3   vortioxetine HBr (TRINTELLIX) 20 MG TABS tablet Take 1 tablet (20 mg total) by mouth daily. 30 tablet 0   No current facility-administered medications for this visit.    Allergies   Allergen Reactions   Amoxicillin-Pot Clavulanate Other (See Comments) and Diarrhea   Topiramate Other (See Comments)    Social History   Socioeconomic History   Marital status: Married    Spouse name: [name]   Number of children: 2   Years of education: Not on file   Highest education level: Not on file  Occupational History   Occupation: Retired   Occupation: Retired from the car business but works part time at [employer]  Tobacco Use   Smoking status: Never   Smokeless tobacco: Never  Building services engineer Use: Never used  Substance and Sexual Activity   Alcohol use: No    Alcohol/week: 0.0 standard drinks of alcohol   Drug use: No   Sexual activity: Yes    Partners: Male  Other Topics Concern   Not on file  Social History Narrative   Not on file   Social Determinants of Health   Financial Resource Strain: Not on file  Food Insecurity: Not on file  Transportation Needs: Not on file  Physical Activity: Not on file  Stress: Not on file  Social Connections: Not on file  Intimate Partner Violence: Not on file    Family History  Problem Relation Age of Onset   Eating disorder Mother    Alcohol abuse Mother    Bipolar disorder Mother    Anxiety disorder Mother    Depression Mother    Kidney disease Mother    Heart disease Mother    Hyperlipidemia Mother    Hypertension Mother    Heart attack Mother    Cancer Mother    COPD Mother    Ovarian cancer Mother    Heart failure Mother    Drug abuse Father    Cancer Father    Heart attack Paternal Grandfather    Heart disease Other    Kidney disease Other    Depression Other    Anxiety disorder Other    Bipolar disorder Other    Alcohol abuse Other    Drug abuse Other    Eating disorder Other     Review of Systems:  As stated in the HPI and otherwise negative.   BP 138/80   Pulse 62   Ht 5\' 11"  (1.803 m)   Wt 123 kg   SpO2 99%   BMI 37.82 kg/m   Physical Examination: General: Well  developed, well nourished, NAD  HEENT: OP clear, mucus membranes moist  SKIN: warm, dry. No rashes. Neuro: No focal deficits  Musculoskeletal: Muscle strength 5/5 all ext  Psychiatric: Mood and affect normal  Neck: No JVD, no carotid bruits, no thyromegaly, no lymphadenopathy.  Lungs:Clear bilaterally, no wheezes, rhonci, crackles Cardiovascular: Regular rate and rhythm. Loud, harsh, late peaking systolic murmur.  Abdomen:Soft. Bowel sounds present. Non-tender.  Extremities: No lower extremity edema. Pulses are 2 + in the bilateral DP/PT.  EKG:  EKG is not ordered today. The ekg ordered today demonstrates   Echo 03/30/22: 1. Left ventricular ejection fraction, by estimation, is 60 to 65%. The  left ventricle has normal function. The left ventricle has no regional  wall motion abnormalities. There is moderate concentric left ventricular  hypertrophy. Left ventricular  diastolic parameters are consistent with Grade I diastolic dysfunction  (impaired relaxation).   2. Right ventricular systolic function is normal. The right ventricular  size is normal. There is mildly elevated pulmonary artery systolic  pressure. The estimated right ventricular systolic pressure is 32.0 mmHg.   3. Left atrial size was moderately dilated.   4. The mitral valve is abnormal. Trivial mitral valve regurgitation. No  evidence of mitral stenosis.   5. The aortic valve is tricuspid. There is moderate calcification of the  aortic valve. Aortic valve regurgitation is trivial. Moderate to severe  aortic valve stenosis. Aortic valve area, by VTI measures 0.91 cm. Aortic  valve mean gradient measures 33.5   mmHg. Aortic valve Vmax measures 4.00 m/s.   6. The inferior vena cava is normal in size with <50% respiratory  variability, suggesting right atrial pressure of 8 mmHg.   7. Aortic stenosis has progressed since last study. (Mean gradient in  3/23 was 24.5 mmHG)   FINDINGS   Left Ventricle: Left ventricular  ejection fraction, by estimation, is 60  to 65%. The left ventricle has normal function. The left ventricle has no  regional wall motion abnormalities. The left ventricular internal cavity  size was normal in size. There is   moderate concentric left ventricular hypertrophy. Left ventricular  diastolic parameters are consistent with Grade I diastolic dysfunction  (impaired relaxation).   Right Ventricle: The right ventricular size is normal. No increase in  right ventricular wall thickness. Right ventricular systolic function is  normal. There is mildly elevated pulmonary artery systolic pressure. The  tricuspid regurgitant velocity is 2.45   m/s, and with an assumed right atrial pressure of 8 mmHg, the estimated  right ventricular systolic pressure is 32.0 mmHg.   Left Atrium: Left atrial size was moderately dilated.   Right Atrium: Right atrial size was normal in size.   Pericardium: There is no evidence of pericardial effusion.   Mitral Valve: The mitral valve is abnormal. There is mild calcification of  the mitral valve leaflet(s). Trivial mitral valve regurgitation. No  evidence of mitral valve stenosis.   Tricuspid Valve: The tricuspid valve is normal in structure. Tricuspid  valve regurgitation is trivial. No evidence of tricuspid stenosis.   Aortic Valve: The aortic valve is tricuspid. There is moderate  calcification of the aortic valve. Aortic valve regurgitation is trivial.  Moderate to severe aortic stenosis is present. Aortic valve mean gradient  measures 33.5 mmHg. Aortic valve peak  gradient measures 63.8 mmHg. Aortic valve area, by VTI measures 0.91 cm.   Pulmonic Valve: The pulmonic valve was normal in structure. Pulmonic valve  regurgitation is not visualized. No evidence of pulmonic stenosis.   Aorta: The aortic root is normal in size and structure.   Venous: The inferior vena cava is normal in size with less than 50%  respiratory variability, suggesting  right atrial pressure of 8 mmHg.   IAS/Shunts: No atrial level shunt detected  by color flow Doppler.     LEFT VENTRICLE  PLAX 2D  LVIDd:         5.00 cm   Diastology  LVIDs:         3.40 cm   LV e' medial:    6.96 cm/s  LV PW:         1.60 cm   LV E/e' medial:  10.8  LV IVS:        1.50 cm   LV e' lateral:   12.10 cm/s  LVOT diam:     2.20 cm   LV E/e' lateral: 6.2  LV SV:         81  LV SV Index:   34  LVOT Area:     3.80 cm     RIGHT VENTRICLE             IVC  RV S prime:     16.50 cm/s  IVC diam: 2.10 cm  TAPSE (M-mode): 2.5 cm  RVSP:           27.0 mmHg   LEFT ATRIUM             Index        RIGHT ATRIUM  LA diam:        4.50 cm 1.89 cm/m   RA Pressure: 3.00 mmHg  LA Vol (A2C):   70.4 ml 29.49 ml/m  LA Vol (A4C):   71.7 ml 30.04 ml/m  LA Biplane Vol: 71.3 ml 29.87 ml/m   AORTIC VALVE  AV Area (Vmax):    0.88 cm  AV Area (Vmean):   0.93 cm  AV Area (VTI):     0.91 cm  AV Vmax:           399.50 cm/s  AV Vmean:          267.500 cm/s  AV VTI:            0.888 m  AV Peak Grad:      63.8 mmHg  AV Mean Grad:      33.5 mmHg  LVOT Vmax:         92.60 cm/s  LVOT Vmean:        65.500 cm/s  LVOT VTI:          0.212 m  LVOT/AV VTI ratio: 0.24    AORTA  Ao Root diam: 3.30 cm  Ao Asc diam:  3.50 cm   MITRAL VALVE               TRICUSPID VALVE  MV Area (PHT): 2.12 cm    TR Peak grad:   24.0 mmHg  MV Decel Time: 357 msec    TR Vmax:        245.00 cm/s  MV E velocity: 75.50 cm/s  Estimated RAP:  3.00 mmHg  MV A velocity: 93.80 cm/s  RVSP:           27.0 mmHg  MV E/A ratio:  0.80                             SHUNTS                             Systemic VTI:  0.21 m  Systemic Diam: 2.20 cm   Recent Labs: 06/10/2021: ALT 46 01/08/2022: Magnesium 2.2 04/09/2022: TSH 2.540 04/20/2022: BUN 23; Creatinine, Ser 1.15; Hemoglobin 14.3; Platelets 155; Potassium 4.5; Sodium 141   Wt Readings from Last 3 Encounters:  04/28/22 123 kg  04/27/22 120.2 kg   04/22/22 123.7 kg     Assessment and Plan:   1. Severe Aortic Valve Stenosis: He has moderate to  severe aortic valve stenosis. I have personally reviewed the echo images.  The aortic valve is thickened, calcified but the leaflets appear to open reasonably well on the short axis images. Mean gradient 33 mmHg with AVA 0.9 cm2. NYHA class 1. Since his AS is moderate and he is asymptomatic, will not move forward with planning for AVR yet. He would likely be a candidate for surgical AVR or TAVR.    I have reviewed the natural history of aortic stenosis with the patient and their family members  who are present today. We have discussed the limitations of medical therapy and the poor prognosis associated with symptomatic aortic stenosis. We have reviewed potential treatment options, including palliative medical therapy, conventional surgical aortic valve replacement, and transcatheter aortic valve replacement. We discussed treatment options in the context of the patient's specific comorbid medical conditions.    Will repeat echo in 6 months as planned and I will see him after the echo.    2. HTN: BP is controlled today but has been elevated at home. Blood pressure log shows systolic pressures between 150 and 180 mmHg. He has not been taking Hydralazine three times per day. I have asked him to continue Benicar and HCTZ and increase his hydralazine 10 mg po TID every day.     Labs/ tests ordered today include:  No orders of the defined types were placed in this encounter.  Disposition:   F/U with me in 6 months  Signed, Verne Carrow, MD, [facility] 04/28/2022 10:27 AM    [facility] [address] [place], [place], [place]  [address] Phone: [phone]; Fax: [phone]

## 2022-04-28 ENCOUNTER — Ambulatory Visit: Payer: Medicare Other | Attending: Cardiovascular Disease | Admitting: Cardiovascular Disease

## 2022-04-28 ENCOUNTER — Encounter: Payer: Self-pay | Admitting: Cardiovascular Disease

## 2022-04-28 VITALS — BP 138/80 | HR 62 | Ht 71.0 in | Wt 271.2 lb

## 2022-04-28 DIAGNOSIS — I350 Nonrheumatic aortic (valve) stenosis: Secondary | ICD-10-CM | POA: Insufficient documentation

## 2022-04-28 DIAGNOSIS — I2510 Atherosclerotic heart disease of native coronary artery without angina pectoris: Secondary | ICD-10-CM

## 2022-04-28 LAB — BASIC METABOLIC PANEL
BUN/Creatinine Ratio: 16 (ref 10–24)
BUN: 17 mg/dL (ref 8–27)
CO2: 29 mmol/L (ref 20–29)
Calcium: 10.4 mg/dL — ABNORMAL HIGH (ref 8.6–10.2)
Chloride: 101 mmol/L (ref 96–106)
Creatinine, Ser: 1.06 mg/dL (ref 0.76–1.27)
Glucose: 79 mg/dL (ref 70–99)
Potassium: 4.8 mmol/L (ref 3.5–5.2)
Sodium: 141 mmol/L (ref 134–144)
eGFR: 77 mL/min/{1.73_m2} (ref >59)

## 2022-04-28 MED ORDER — HYDRALAZINE HCL 10 MG PO TABS: 10.0000 mg | ORAL_TABLET | Freq: Three times a day (TID) | ORAL | 3 refills | Status: DC

## 2022-04-28 NOTE — Progress Notes (Unsigned)
Cardiology Clinic Note   Patient Name: Thomas [name] Date of Encounter: 04/29/2022  Primary Care Provider:  Kathalene Frames, MD Primary Cardiologist:  Donato Heinz, MD  Patient Profile    Thomas [name] 67 year old male presents to the clinic today for follow-up evaluation of his hypertension. 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   Shortness of breath on exertion    Ventral hernia without obstruction or gangrene 03/27/2016   Vitamin D deficiency    Past Surgical History:  Procedure Laterality Date   Epididymis Excision Left    Spermocele   HEMORROIDECTOMY     HERNIA REPAIR Bilateral 1967 and 1986   x2 open inguinal hernia repairs   INCISION AND DRAINAGE OF WOUND  2017   Wound Infection Post OP   LYMPHADENECTOMY Bilateral 05/17/2014   Procedure: PELVIC LYMPHADENECTOMY;  Surgeon: Raynelle Bring, MD;  Location: [facility];  Service: Urology;  Laterality: Bilateral;   PROSTATE BIOPSY  03/22/14   Raynelle Bring   ROBOT ASSISTED LAPAROSCOPIC RADICAL PROSTATECTOMY N/A 05/17/2014   Procedure: ROBOTIC ASSISTED LAPAROSCOPIC RADICAL PROSTATECTOMY LEVEL 2;  Surgeon: Raynelle Bring, MD;  Location: [facility];  Service: Urology;  Laterality: N/A;   SKIN TAG REMOVAL     15 lesions   VASECTOMY     VENTRAL HERNIA REPAIR N/A 03/27/2016   Procedure: LAPAROSCOPIC VENTRAL HERNIA;  Surgeon: Alphonsa Overall, MD;   Location: [facility];  Service: General;  Laterality: N/A;  WITH MESH    Allergies  Allergies  Allergen Reactions   Amoxicillin-Pot Clavulanate Other (See Comments) and Diarrhea   Topiramate Other (See Comments)    History of Present Illness    Thomas [name] has a PMH of coronary artery disease, HTN, HLD, aortic stenosis, obesity.  His echocardiogram 01/27/2019 showed an EF of 60-65%, moderate LVH, G1 DD, normal RV function, mild-moderate aortic stenosis.  His coronary CTA 12/20 showed nonobstructive CAD and a calcium score of 113.  He wore a cardiac event monitor x 6 days 3/22 which showed 1 episode of NSVT lasting 4 beats, frequent PVCs (6.3%).  Echocardiogram 05/28/2021 showed an EF of 65-70%, moderate LVH, G1 DD, normal RV function, severe left atrial enlargement, moderate aortic stenosis.  His echocardiogram 03/30/2022 showed an EF of 60-65%, G1 DD, normal RV function, moderate-severe aortic stenosis.   He was seen in follow-up by Dr. Gardiner Rhyme on 04/02/2022.  During that time he reported he was doing well.  He denied chest pain and dyspnea.  He stated that he was very active and was working 12-14 hours on his house at a time.  He reported occasional lightheadedness with standing.  He denied any syncope.  He  reported that he had lost around 20 pounds over the course of a year.   He contacted the nurse triage line 04/08/2022 and reported that his heart rate was 59.  He had been monitoring his heart rate and noted heart rates in the 40s and as low as 36.  He denied symptoms.  He had been receiving notifications for low pulse on his iWatch.  He reported concern due to his recent diagnosis of aortic stenosis.  He was added to my schedule today.   He presented to the clinic 04/09/22 for evaluation and stated since he was seen in the office earlier in the month he had noticed several nights of low heart rate.  He reported that he typically did not wear his watch at night and had just started doing so.  He  had noted heart rates below 50 while sleeping.  He denies episodes of dizziness.  He reports that he has increased his physical activity and actually feels better than he has in a long time.  He was newly retired and was  working at [employer].  We reviewed his recent echocardiogram and previous coronary CT.  He expressed understanding.  His EKG  showed sinus bradycardia with possible premature atrial complexes and aberrant conduction 56 bpm.  I ordered CBC, BMP, TSH 7-day cardiac event monitor and plan follow-up in 8 weeks.   He was seen in the emergency department on 04/20/2022.  He reported chest discomfort his high-sensitivity troponins were 22 and 25.  His chest discomfort resolved.  He was instructed to follow-up with cardiology.   He contacted the nurse triage line 04/22/2022 and reported elevated blood pressure since being seen in the emergency department.  He reported blood pressures in the [id number] systolic.  He presented to the clinic 04/22/2022 for follow-up evaluation and stated he had some worry about his blood pressure and his girlfriend's upcoming MRIs.  We reviewed his emergency department visit and echocardiogram.  We discussed his medications and elevated blood pressure.  Case was discussed with Dr. Gardiner Rhyme.  I  referred him to structural heart team for further evaluation of aortic stenosis.  His blood pressure initially was 184/98 and on recheck was 162/84.  His blood pressure had been elevated since his visit in the emergency department.  The elevation appeared to be in part related to anxiety.  He had not had any other changes to his diet or medications.  I prescribed HCTZ 25, olmesartan 20, and hydralazine 10 mg 3 times daily for systolic blood pressure over 160.  I ordered a BMP to be drawn in 1 week  and asked him to maintain a blood pressure log.  I planned follow-up in 1 week.  He presents to the clinic today for follow-up evaluation and states his blood pressure has been  much better controlled.  He was seen by Dr. Angelena Form yesterday who recommended repeat echocardiogram for his AAS in 6 months.  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  He reports that his stress level is elevated due to his partners brain hemangioma.  They are planning to have consultation with neurology soon and review recommendations for treatment/management.  We reviewed his medications.  Blood pressure in the clinic today is 130/80.  I will add amlodipine 5 mg daily to his medication regimen and continue his olmesartan and hydralazine for systolic blood pressure greater than 160.  Will plan follow-up in 3 months.  Today denies chest pain, shortness  of breath, lower extremity edema, fatigue, presyncope, syncope, orthopnea, and PND.    Home Medications    Prior to Admission medications   Medication Sig Start Date End Date Taking? Authorizing Provider  aspirin EC 81 MG tablet Take 81 mg by mouth daily. Swallow whole.    [provider]  Cholecalciferol (VITAMIN D) 50 MCG (2000 UT) tablet Take 1 tablet (2,000 Units total) by mouth daily. 07/28/21   Opalski, Neoma Laming, DO  ezetimibe (ZETIA) 10 MG tablet Take 1 tablet (10 mg total) by mouth daily. 10/17/21 10/12/22  Donato Heinz, MD  fluticasone (FLONASE) 50 MCG/ACT nasal spray Place 2 sprays into both nostrils daily as needed for allergies or rhinitis.    [provider]  NEOMYCIN-POLYMYXIN-HYDROCORTISONE (CORTISPORIN) 1 % SOLN OTIC solution Apply 1-2 drops to toe BID after soaking Patient not taking: Reported on 04/09/2022 10/07/21   Hyatt, Max T, DPM  rosuvastatin (CRESTOR) 40 MG tablet Take 1 tablet (40 mg total) by mouth daily. 04/16/22   Donato Heinz, MD  valsartan-hydrochlorothiazide (DIOVAN-HCT) 320-25 MG tablet Take 1 tablet by mouth daily. 10/21/21   [provider]  vortioxetine HBr (TRINTELLIX) 20 MG TABS tablet Take 1 tablet (20 mg total) by mouth daily. 12/18/21   Mozingo,  Berdie Ogren, NP    Family History    Family History  Problem Relation Age of Onset   Eating disorder Mother    Alcohol abuse Mother    Bipolar disorder Mother    Anxiety disorder Mother    Depression Mother    Kidney disease Mother    Heart disease Mother    Hyperlipidemia Mother    Hypertension Mother    Heart attack Mother    Cancer Mother    COPD Mother    Ovarian cancer Mother    Heart failure Mother    Drug abuse Father    Cancer Father    Heart attack Paternal Grandfather    Heart disease Other    Kidney disease Other    Depression Other    Anxiety disorder Other    Bipolar disorder Other    Alcohol abuse Other    Drug abuse Other    Eating disorder Other    He indicated that his mother is deceased. He indicated that his father is deceased. He indicated that the status of his paternal grandfather is unknown. He indicated that the status of his other is unknown.  Social History    Social History   Socioeconomic History   Marital status: Married    Spouse name: [name]   Number of children: 2   Years of education: Not on file   Highest education level: Not on file  Occupational History   Occupation: Retired   Occupation: Retired from the car business but works part time at [employer]  Tobacco Use   Smoking status: Never   Smokeless tobacco: Never  Vaping Use   Vaping Use: Never used  Substance and Sexual Activity   Alcohol use: No    Alcohol/week: 0.0 standard drinks of alcohol   Drug use: No   Sexual activity: Yes    Partners: Male  Other Topics Concern   Not on file  Social History Narrative   Not on file   Social Determinants of Health   Financial Resource Strain: Not on file  Food Insecurity: Not on file  Transportation Needs: Not on file  Physical Activity: Not on file  Stress: Not on file  Social Connections: Not  on file  Intimate Partner Violence: Not on file     Review of Systems    General:  No chills, fever,  night sweats or weight changes.  Cardiovascular:  No chest pain, dyspnea on exertion, edema, orthopnea, palpitations, paroxysmal nocturnal dyspnea. Dermatological: No rash, lesions/masses Respiratory: No cough, dyspnea Urologic: No hematuria, dysuria Abdominal:   No nausea, vomiting, diarrhea, bright red blood per rectum, melena, or hematemesis Neurologic:  No visual changes, wkns, changes in mental status. All other systems reviewed and are otherwise negative except as noted above.  Physical Exam    VS:  BP 130/80 (BP Location: Left Arm, Patient Position: Sitting, Cuff Size: Large)   Pulse 61   Ht 5' 11"$  (1.803 m)   Wt 270 lb (122.5 kg)   BMI 37.66 kg/m  , BMI Body mass index is 37.66 kg/m. GEN: Well nourished, well developed, in no acute distress. HEENT: normal. Neck: Supple, no JVD, carotid bruits, or masses. Cardiac: RRR,  3/6 systolic murmur heard at apex , rubs, or gallops. No clubbing, cyanosis, edema.  Radials/DP/PT 2+ and equal bilaterally.  Respiratory:  Respirations regular and unlabored, clear to auscultation bilaterally. GI: Soft, nontender, nondistended, BS + x 4. MS: no deformity or atrophy. Skin: warm and dry, no rash. Neuro:  Strength and sensation are intact. Psych: Normal affect.  Accessory Clinical Findings    Recent Labs: 06/10/2021: ALT 46 01/08/2022: Magnesium 2.2 04/09/2022: TSH 2.540 04/20/2022: Hemoglobin 14.3; Platelets 155 04/28/2022: BUN 17; Creatinine, Ser 1.06; Potassium 4.8; Sodium 141   Recent Lipid Panel    Component Value Date/Time   CHOL 105 01/08/2022 1034   TRIG 83 01/08/2022 1034   HDL 40 01/08/2022 1034   CHOLHDL 2.6 01/08/2022 1034   LDLCALC 48 01/08/2022 1034         ECG personally reviewed by me today-none today.  EKG 04/09/2022 sinus bradycardia with possible premature atrial complexes with apparent conduction 56 bpm- No acute changes   Assessment & Plan   1.  Essential hypertension-BP today 130/80.  BMP unremarkable,  reviewed with patient. Continue  HCTZ, hydralazine-3 times daily for systolic BP greater than 0000000 Start amlodipine 5 mg daily Continue olmesartan 20 maintain blood pressure log  Mindfulness stress reduction sheet given.   Aortic stenosis-activity level unchanged.  Seen and evaluated by Dr. Angelena Form who recommended repeat echocardiogram in 6 months.  Echocardiogram 03/30/2022 showed 60-65% EF, G1 DD, normal RV function, moderate-severe aortic stenosis.   Following with structural heart team  Coronary artery disease-no chest pain today.  Previously noted to have nonobstructive CAD via coronary CTA 02/23/2019. Continue heart healthy low-sodium high-fiber diet Maintain physical activity  Chest discomfort-no chest pain today.  Chest discomfort previously appeared to be related to anxiety. Follows with Mozingo, NP Maintain physical activity  Sinus bradycardia-heart rate today 61 bpm.   Avoid AV nodal blocking agents. Patient reassured that his blood pressure has remained stable.  Discussed physiology of heart rate at rest and with increased fitness. Continue to wait on results from cardiac event monitor-will reach out to monitor company.     Disposition: Follow-up with Dr. Gardiner Rhyme in 3-4 months.   Jossie Ng. Natanael Saladin NP-C     04/29/2022, 8:14 AM [place] [address] Office [phone] Fax [phone]    I spent 15 minutes examining this patient, reviewing medications, and using patient centered shared decision making involving her cardiac care.  Prior to her visit I spent greater than 20 minutes reviewing her  past medical history,  medications, and prior cardiac tests.

## 2022-04-28 NOTE — Patient Instructions (Signed)
Medication Instructions:  Take hydralazine 10 mg three times a day   *If you need a refill on your cardiac medications before your next appointment, please call your pharmacy*   Lab Work: none    Testing/Procedures: none   Follow-Up: In 6 months - see below

## 2022-04-29 ENCOUNTER — Institutional Professional Consult (permissible substitution): Payer: Medicare Other | Admitting: Cardiovascular Disease

## 2022-04-29 ENCOUNTER — Ambulatory Visit: Payer: Medicare Other | Attending: General Practice | Admitting: General Practice

## 2022-04-29 ENCOUNTER — Encounter: Payer: Self-pay | Admitting: General Practice

## 2022-04-29 VITALS — BP 130/80 | HR 61 | Ht 71.0 in | Wt 270.0 lb

## 2022-04-29 DIAGNOSIS — R001 Bradycardia, unspecified: Secondary | ICD-10-CM

## 2022-04-29 DIAGNOSIS — R079 Chest pain, unspecified: Secondary | ICD-10-CM | POA: Diagnosis not present

## 2022-04-29 DIAGNOSIS — I2510 Atherosclerotic heart disease of native coronary artery without angina pectoris: Secondary | ICD-10-CM

## 2022-04-29 DIAGNOSIS — I350 Nonrheumatic aortic (valve) stenosis: Secondary | ICD-10-CM

## 2022-04-29 DIAGNOSIS — I10 Essential (primary) hypertension: Secondary | ICD-10-CM | POA: Diagnosis not present

## 2022-04-29 MED ORDER — AMLODIPINE BESYLATE 5 MG PO TABS: 5.0000 mg | ORAL_TABLET | Freq: Every day | ORAL | 6 refills | Status: DC

## 2022-04-29 NOTE — Patient Instructions (Signed)
Medication Instructions:  START AMLODIPINE 5MG DAILY *If you need a refill on your cardiac medications before your next appointment, please call your pharmacy*  Lab Work: NONE If you have labs (blood work) drawn today and your tests are completely normal, you will receive your results only by: [place] (if you have MyChart) OR  A paper copy in the mail If you have any lab test that is abnormal or we need to change your treatment, we will call you to review the results.  Testing/Procedures: NONE  Other Instructions PLEASE READ AND FOLLOW STRESS REDUCTION TIPS-ATTACHED  PLEASE TAKE AND LOG YOUR BLOOD PRESSURE  Follow-Up: At [facility], you and your health needs are our priority.  As part of our continuing mission to provide you with exceptional heart care, we have created designated Provider Care Teams.  These Care Teams include your primary Cardiologist (physician) and Advanced Practice Providers (APPs -  Physician Assistants and Nurse Practitioners) who all work together to provide you with the care you need, when you need it.  Your next appointment:   3-4 month(s)  Provider:   Donato Heinz, MD      Mindfulness-Based Stress Reduction Mindfulness-based stress reduction (MBSR) is a program that helps people learn to practice mindfulness. Mindfulness is the practice of consciously paying attention to the present moment. MBSR focuses on developing self-awareness, which lets you respond to life stress without judgment or negative feelings. It can be learned and practiced through techniques such as education, breathing exercises, meditation, and yoga. MBSR includes several mindfulness techniques in one program. MBSR works best when you understand the treatment, are willing to try new things, and can commit to spending time practicing what you learn. MBSR training may include learning about: How your feelings, thoughts, and reactions affect your body. New ways to  respond to things that cause negative thoughts to start (triggers). How to notice your thoughts and let go of them. Practicing awareness of everyday things that you normally do without thinking. The techniques and goals of different types of meditation. What are the benefits of MBSR? MBSR can have many benefits, which include helping you to: Develop self-awareness. This means knowing and understanding yourself. Learn skills and attitudes that help you to take part in your own health care. Learn new ways to care for yourself. Be more accepting about how things are, and let things go. Be less judgmental and approach things with an open mind. Be patient with yourself and trust yourself more. MBSR has also been shown to: Reduce negative emotions, such as sadness, overwhelm, and worry. Improve memory and focus. Change how you sense and react to pain. Boost your body's ability to fight infections. Help you connect better with other people. Improve your sense of well-being. How to practice mindfulness To do a basic awareness exercise: Find a comfortable place to sit. Pay attention to the present moment. Notice your thoughts, feelings, and surroundings just as they are. Avoid judging yourself, your feelings, or your surroundings. Make note of any judgment that comes up and let it go. Your mind may wander, and that is okay. Make note of when your thoughts drift, and return your attention to the present moment. To do basic mindfulness meditation: Find a comfortable place to sit. This may include a stable chair or a firm floor cushion. Sit upright with your back straight. Let your arms fall next to your sides, with your hands resting on your legs. If you are sitting in a chair,  rest your feet flat on the floor. If you are sitting on a cushion, cross your legs in front of you. Keep your head in a neutral position with your chin dropped slightly. Relax your jaw and rest the tip of your tongue on the  roof of your mouth. Drop your gaze to the floor or close your eyes. Breathe normally and pay attention to your breath. Feel the air moving in and out of your nose. Feel your belly expanding and relaxing with each breath. Your mind may wander, and that is okay. Make note of when your thoughts drift, and return your attention to your breath. Avoid judging yourself, your feelings, or your surroundings. Make note of any judgment or feelings that come up, let them go, and bring your Follow these instructions at home:  Find a local in-person or online MBSR program. Set aside some time regularly for mindfulness practice. Practice every day if you can. Even 10 minutes of practice is helpful. Find a mindfulness practice that works best for you. This may include one or more of the following: Meditation. This involves focusing your mind on a certain thought or activity. Breathing awareness exercises. These help you to stay present by focusing on your breath. Body scan. For this practice, you lie down and pay attention to each part of your body from head to toe. You can identify tension and soreness and consciously relax parts of your body. Yoga. Yoga involves stretching and breathing, and it can improve your ability to move and be flexible. It can also help you to test your body's limits, which can help you release stress. Mindful eating. This way of eating involves focusing on the taste, texture, color, and smell of each bite of food. This slows down eating and helps you feel full sooner. For this reason, it can be an important part of a weight loss plan. Find a podcast or recording that provides guidance for breathing awareness, body scan, or meditation exercises. You can listen to these any time when you have a free moment to rest without distractions. Follow your treatment plan as told by your health care provider. This may include taking regular medicines and making changes to your diet or lifestyle as  recommended. Where to find more information You can find more information about MBSR from: Your health care provider. Community-based meditation centers or programs. Programs offered near you. Summary Mindfulness-based stress reduction (MBSR) is a program that teaches you how to consciously pay attention to the present moment. It is used to help you deal better with daily stress, feelings, and pain. MBSR focuses on developing self-awareness, which allows you to respond to life stress without judgment or negative feelings. MBSR programs may involve learning different mindfulness practices, such as breathing exercises, meditation, yoga, body scan, or mindful eating. Find a mindfulness practice that works best for you, and set aside time for it on a regular basis. This information is not intended to replace advice given to you by your health care provider. Make sure you discuss any questions you have with your health care provider. Document Revised: 10/10/2020 Document Reviewed: 10/10/2020 Elsevier Patient Education  [place].

## 2022-04-30 NOTE — Procedures (Signed)
   Patient Name: Thomas [name], Thomas [name] Date: 04/27/2022 Gender: Male D.O.B: 10-16-1955 Age (years): 81 Referring Provider: Fransico Him MD, ABSM Height (inches): 71 Interpreting Physician: Fransico Him MD, ABSM Weight (lbs): 265 RPSGT: Gwenyth Allegra BMI: 24 MRN: [id number]  CLINICAL INFORMATION Sleep Study Type: CPAP Titration  Indication for sleep study: Hypertension, OSA  Epworth Sleepiness Score:  SLEEP STUDY TECHNIQUE As per the AASM Manual for the Scoring of Sleep and Associated Events v2.3 (April 2016) with a hypopnea requiring 4% desaturations.  The channels recorded and monitored were frontal, central and occipital EEG, electrooculogram (EOG), submentalis EMG (chin), nasal and oral airflow, thoracic and abdominal wall motion, anterior tibialis EMG, snore microphone, electrocardiogram, and pulse oximetry. Continuous positive airway pressure (CPAP) was initiated when the patient met split night criteria and was titrated according to treat sleep-disordered breathing.  MEDICATIONS Medications self-administered by patient taken the night of the study : N/A  RESPIRATORY PARAMETERS Titration Optimal Pressure (cm):16  AHI at Optimal Pressure (/hr):3.2  Min O2 at Optimal Pressure (%):91.0 Supine % at Optimal (%):100  Sleep % at Optimal (%):45   SLEEP ARCHITECTURE The recording time for the entire night was 387.4 minutes.  During the titration period of 387.4 minutes, the patient slept for 271.0 minutes in REM and nonREM, yielding a sleep efficiency of 69.9%. Sleep onset after lights out was 7.7 minutes with a REM latency of 264.5 minutes. The patient spent 11.3% of the night in stage N1 sleep, 81.0% in stage N2 sleep, 0.0% in stage N3 and 7.7% in REM.  CARDIAC DATA The 2 lead EKG demonstrated sinus rhythm. The mean heart rate was 100.0 beats per minute. Other EKG findings include: PVCs.  LEG MOVEMENT DATA The total Periodic Limb Movements of Sleep (PLMS) were 0. The PLMS  index was 0.0 .  IMPRESSIONS - An optimal PAP pressure was selected for this patient ( 16 cm of water) - No significant central sleep apnea occurred during the diagnostic portion of the study (CAI = 1.1/hour). - The patient snored with moderate snoring volume during the diagnostic portion of the study. - PVCs were noted during this study. - Clinically significant periodic limb movements did not occur during sleep.  DIAGNOSIS - Obstructive Sleep Apnea (G47.33)  RECOMMENDATIONS - Trial of ResMed CPAP therapy on 16 cm H2O with a Small size Fisher&Paykel Full Face Simplus mask and heated humidification. - Avoid alcohol, sedatives and other CNS depressants that may worsen sleep apnea and disrupt normal sleep architecture. - Sleep hygiene should be reviewed to assess factors that may improve sleep quality. - Weight management and regular exercise should be initiated or continued. - Return to Sleep Center for re-evaluation after 4 weeks of therapy  [Electronically signed] 04/30/2022 01:25 PM  Fransico Him MD, ABSM Diplomate, American Board of Sleep Medicine

## 2022-05-04 ENCOUNTER — Telehealth: Payer: Self-pay | Admitting: *Deleted

## 2022-05-04 NOTE — Telephone Encounter (Signed)
-----   Message from Lauralee Evener, [place] sent at 04/30/2022  1:39 PM EST -----  ----- Message ----- From: Sueanne Margarita, MD Sent: 04/30/2022   1:26 PM EST To: [facility] Sleep Studies  Please let patient know that they had a successful PAP titration and let DME know that orders are in EPIC.  Please set up 6 week OV with me.

## 2022-05-04 NOTE — Telephone Encounter (Signed)
The patient has been notified of the result and verbalized understanding.  All questions (if any) were answered. Marolyn Hammock, [place] 05/04/2022 4:24 PM    Upon patient request DME selection is [facility]. Patient understands he will be contacted by [facility] to set up his cpap. Patient understands to call if [facility] does not contact him with new setup in a timely manner. Patient understands they will be called once confirmation has been received from [facility]/ that they have received their new machine to schedule 10 week follow up appointment.   [facility] notified of new cpap order  Please add to airview Patient was grateful for the call and thanked me.

## 2022-05-20 ENCOUNTER — Ambulatory Visit: Payer: Medicare Other | Admitting: Cardiology

## 2022-05-28 DIAGNOSIS — R001 Bradycardia, unspecified: Secondary | ICD-10-CM | POA: Diagnosis not present

## 2022-06-04 ENCOUNTER — Ambulatory Visit: Payer: Medicare Other | Admitting: General Practice

## 2022-07-20 NOTE — Progress Notes (Signed)
Cardiology Office Note:    Date:  07/23/2022   ID:  Thomas [name], DOB [birth year], MRN [id number]  PCP:  Emilio Aspen, MD  Cardiologist:  Little Ishikawa, MD  Electrophysiologist:  None   Referring MD: Emilio Aspen, *   Chief Complaint  Patient presents with   Follow-up    3-4 months.   Cardiac Valve Problem    History of Present Illness:    Thomas [name] is a 67 y.o. male with a hx of CAD, hypertension, hyperlipidemia, aortic stenosis, obesity who presents for follow-up.  Echocardiogram 01/27/2019 showed EF 55 to 60%, moderate LVH, grade 1 diastolic dysfunction, normal RV function, mild to moderate aortic stenosis.  Coronary CTA on 02/23/2019 showed nonobstructive CAD, calcium score 113 (87th percentile).  Zio patch x6 days on 06/12/2020 showed 1 episode of NSVT lasting 4 beats, frequent PVCs (6.3% of beats).  Echocardiogram 05/28/2021 showed EF 65 to 70%, moderate LVH, grade 1 diastolic dysfunction, normal RV function, severe left atrial enlargement, moderate aortic stenosis.  Echocardiogram 03/30/2022 showed EF 60 to 65%, grade 1 diastolic dysfunction, normal RV function, moderate to severe aortic stenosis.  Zio patch x 7 days 05/2022 showed frequent PVCs (7.7% of beats).  Since last clinic visit, he reports that he is doing well.  Denies any recent chest pain.  He has not been exercising though as he has been caring for his wife who is recovering from brain tumor removal which was complicated by infection.  Denies any dyspnea, lightheadedness, syncope, lower extremity edema, or palpitations.  BP Readings from Last 3 Encounters:  07/23/22 118/82  04/29/22 130/80  04/28/22 138/80    Wt Readings from Last 3 Encounters:  07/23/22 272 lb (123.4 kg)  04/29/22 270 lb (122.5 kg)  04/28/22 271 lb 3.2 oz (123 kg)     Past Medical History:  Diagnosis Date   Anemia    hx of in 1992    Anxiety    Arthritis    lower back    Back pain    Blockage of coronary  artery of heart (HCC)    Constipation    Generalized muscle weakness    GERD (gastroesophageal reflux disease)    rarely    Hearing loss    History of anxiety    Hyperlipemia    Hypertension    IBS (irritable bowel syndrome)    Joint pain    Lower urinary tract symptoms (LUTS)    Malignant neoplasm of prostate (HCC) 04/17/2014   Other fatigue    Pneumonia    hx x2 of pneumonia at age 40    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   Shortness of breath on exertion    Ventral hernia without obstruction or gangrene 03/27/2016   Vitamin D deficiency     Past Surgical History:  Procedure Laterality Date   Epididymis Excision Left    Spermocele   HEMORROIDECTOMY     HERNIA REPAIR Bilateral 1967 and 1986   x2 open inguinal hernia repairs   INCISION AND DRAINAGE OF WOUND  2017   Wound Infection Post OP   LYMPHADENECTOMY Bilateral 05/17/2014   Procedure: PELVIC LYMPHADENECTOMY;  Surgeon: Heloise Purpura, MD;  Location: [facility];  Service: Urology;  Laterality: Bilateral;   PROSTATE BIOPSY  03/22/14   Heloise Purpura   ROBOT ASSISTED LAPAROSCOPIC RADICAL PROSTATECTOMY N/A 05/17/2014   Procedure: ROBOTIC ASSISTED LAPAROSCOPIC RADICAL PROSTATECTOMY LEVEL 2;  Surgeon: Heloise Purpura, MD;  Location: [facility];  Service:  Urology;  Laterality: N/A;   SKIN TAG REMOVAL     15 lesions   VASECTOMY     VENTRAL HERNIA REPAIR N/A 03/27/2016   Procedure: LAPAROSCOPIC VENTRAL HERNIA;  Surgeon: Ovidio Kin, MD;  Location: [facility];  Service: General;  Laterality: N/A;  WITH MESH    Current Medications: Current Meds  Medication Sig   amLODipine (NORVASC) 5 MG tablet Take 1 tablet (5 mg total) by mouth daily.   aspirin EC 81 MG tablet Take 81 mg by mouth daily. Swallow whole.   Cholecalciferol (VITAMIN D) 50 MCG (2000 UT) tablet Take 1 tablet (2,000 Units total) by mouth daily.   ezetimibe (ZETIA) 10 MG tablet Take 1 tablet (10 mg total) by mouth daily.   fluticasone (FLONASE) 50 MCG/ACT nasal spray  Place 2 sprays into both nostrils daily as needed for allergies or rhinitis.   hydrALAZINE (APRESOLINE) 10 MG tablet Take 1 tablet (10 mg total) by mouth 3 (three) times daily.   hydrochlorothiazide (HYDRODIURIL) 25 MG tablet Take 1 tablet (25 mg total) by mouth daily.   olmesartan (BENICAR) 20 MG tablet Take 1 tablet (20 mg total) by mouth daily.   rosuvastatin (CRESTOR) 40 MG tablet Take 1 tablet (40 mg total) by mouth daily.   vortioxetine HBr (TRINTELLIX) 20 MG TABS tablet Take 1 tablet (20 mg total) by mouth daily.     Allergies:   Amoxicillin-pot clavulanate and Topiramate   Social History   Socioeconomic History   Marital status: Married    Spouse name: [name]   Number of children: 2   Years of education: Not on file   Highest education level: Not on file  Occupational History   Occupation: Retired   Occupation: Retired from the car business but works part time at [employer]  Tobacco Use   Smoking status: Never   Smokeless tobacco: Never  Vaping Use   Vaping Use: Never used  Substance and Sexual Activity   Alcohol use: No    Alcohol/week: 0.0 standard drinks of alcohol   Drug use: No   Sexual activity: Yes    Partners: Male  Other Topics Concern   Not on file  Social History Narrative   Not on file   Social Determinants of Health   Financial Resource Strain: Not on file  Food Insecurity: Not on file  Transportation Needs: Not on file  Physical Activity: Not on file  Stress: Not on file  Social Connections: Not on file     Family History: The patient's family history includes Alcohol abuse in his mother and another family member; Anxiety disorder in his mother and another family member; Bipolar disorder in his mother and another family member; COPD in his mother; Cancer in his father and mother; Depression in his mother and another family member; Drug abuse in his father and another family member; Eating disorder in his mother and another family  member; Heart attack in his mother and paternal grandfather; Heart disease in his mother and another family member; Heart failure in his mother; Hyperlipidemia in his mother; Hypertension in his mother; Kidney disease in his mother and another family member; Ovarian cancer in his mother.  ROS:   Please see the history of present illness.     All other systems reviewed and are negative.  EKGs/Labs/Other Studies Reviewed:    The following studies were reviewed today:   EKG:   01/08/22: Sinus rhythm, PVC, rate 64, no ST abnormalities  Recent Labs: 01/08/2022: Magnesium 2.2  04/09/2022: TSH 2.540 04/20/2022: Hemoglobin 14.3; Platelets 155 04/28/2022: BUN 17; Creatinine, Ser 1.06; Potassium 4.8; Sodium 141  Recent Lipid Panel    Component Value Date/Time   CHOL 105 01/08/2022 1034   TRIG 83 01/08/2022 1034   HDL 40 01/08/2022 1034   CHOLHDL 2.6 01/08/2022 1034   LDLCALC 48 01/08/2022 1034    Physical Exam:    VS:  BP 118/82 (BP Location: Right Arm, Patient Position: Sitting, Cuff Size: Large)   Pulse 64   Ht 5\' 11"  (1.803 m)   Wt 272 lb (123.4 kg)   BMI 37.94 kg/m     Wt Readings from Last 3 Encounters:  07/23/22 272 lb (123.4 kg)  04/29/22 270 lb (122.5 kg)  04/28/22 271 lb 3.2 oz (123 kg)     GEN:  Well nourished, well developed in no acute distress HEENT: Normal NECK: No JVD; No carotid bruits LYMPHATICS: No lymphadenopathy CARDIAC: RRR, 3/6 systolic murmur RESPIRATORY:  Clear to auscultation without rales, wheezing or rhonchi  ABDOMEN: Soft, non-tender, non-distended MUSCULOSKELETAL:  No edema; No deformity  SKIN: Warm and dry NEUROLOGIC:  Alert and oriented x 3 PSYCHIATRIC:  Normal affect   ASSESSMENT:    1. Aortic valve stenosis, etiology of cardiac valve disease unspecified   2. CAD in native artery   3. PVC (premature ventricular contraction)   4. Essential hypertension   5. Hyperlipidemia, unspecified hyperlipidemia type   6. OSA (obstructive sleep apnea)       PLAN:    Aortic stenosis: Echocardiogram 03/30/2022 showed EF 60 to 65%, grade 1 diastolic dysfunction, normal RV function, moderate to severe aortic stenosis.   -He is very active, appears asymptomatic.  Will monitor closely, plan repeat echo in 6 months.  Asked him to call and let us know if he develops any chest pain, dyspnea, or lightheadedness/syncope  CAD: Coronary CTA on 02/23/2019 showed nonobstructive CAD, calcium score 113 (87th percentile).  Echocardiogram 05/28/2021 showed EF 65 to 70%, moderate LVH, grade 1 diastolic dysfunction, normal RV function, severe left atrial enlargement, moderate aortic stenosis. -Continue rosuvastatin and Zetia  PVCs: Zio patch x 7 days 05/2022 showed frequent PVCs (7.7% of beats).  Has had low resting heart rate, not on AV nodal block..  Continue to monitor  Hypertension: On valsartan-HCTZ 320-25 mg daily.  Appears controlled  Hyperlipidemia: On rosuvastatin 20 mg daily and Zetia 10 mg daily.  LDL 48 on 01/08/22  OSA: severe on sleep study 01/2022, started CPAP.  Report compliance but having some issues with his mask, has upcoming appointment with Dr Mayford Knife  RTC in 6 months    Medication Adjustments/Labs and Tests Ordered: Current medicines are reviewed at length with the patient today.  Concerns regarding medicines are outlined above.  No orders of the defined types were placed in this encounter.  No orders of the defined types were placed in this encounter.   Patient Instructions  Medication Instructions:  Your physician recommends that you continue on your current medications as directed. Please refer to the Current Medication list given to you today.  *If you need a refill on your cardiac medications before your next appointment, please call your pharmacy*  Follow-Up: At [facility], you and your health needs are our priority.  As part of our continuing mission to provide you with exceptional heart care, we have created  designated Provider Care Teams.  These Care Teams include your primary Cardiologist (physician) and Advanced Practice Providers (APPs -  Physician Assistants and Nurse Practitioners)  who all work together to provide you with the care you need, when you need it.  We recommend signing up for the patient portal called "MyChart".  Sign up information is provided on this After Visit Summary.  MyChart is used to connect with patients for Virtual Visits (Telemedicine).  Patients are able to view lab/test results, encounter notes, upcoming appointments, etc.  Non-urgent messages can be sent to your provider as well.   To learn more about what you can do with MyChart, go to [email or web address].    Your next appointment:   6 month(s)  Provider:   Little Ishikawa, MD        Signed, Little Ishikawa, MD  07/23/2022 1:34 PM    [facility]

## 2022-07-23 ENCOUNTER — Encounter: Payer: Self-pay | Admitting: Cardiology

## 2022-07-23 ENCOUNTER — Ambulatory Visit: Payer: Medicare Other | Attending: General Practice | Admitting: Cardiology

## 2022-07-23 VITALS — BP 118/82 | HR 64 | Ht 71.0 in | Wt 272.0 lb

## 2022-07-23 DIAGNOSIS — G4733 Obstructive sleep apnea (adult) (pediatric): Secondary | ICD-10-CM | POA: Diagnosis present

## 2022-07-23 DIAGNOSIS — E785 Hyperlipidemia, unspecified: Secondary | ICD-10-CM | POA: Diagnosis present

## 2022-07-23 DIAGNOSIS — I350 Nonrheumatic aortic (valve) stenosis: Secondary | ICD-10-CM | POA: Insufficient documentation

## 2022-07-23 DIAGNOSIS — I2510 Atherosclerotic heart disease of native coronary artery without angina pectoris: Secondary | ICD-10-CM | POA: Diagnosis present

## 2022-07-23 DIAGNOSIS — I493 Ventricular premature depolarization: Secondary | ICD-10-CM | POA: Diagnosis present

## 2022-07-23 DIAGNOSIS — I10 Essential (primary) hypertension: Secondary | ICD-10-CM | POA: Diagnosis present

## 2022-07-23 NOTE — Patient Instructions (Signed)
Medication Instructions:  Your physician recommends that you continue on your current medications as directed. Please refer to the Current Medication list given to you today.  *If you need a refill on your cardiac medications before your next appointment, please call your pharmacy*  Follow-Up: At [facility], you and your health needs are our priority.  As part of our continuing mission to provide you with exceptional heart care, we have created designated Provider Care Teams.  These Care Teams include your primary Cardiologist (physician) and Advanced Practice Providers (APPs -  Physician Assistants and Nurse Practitioners) who all work together to provide you with the care you need, when you need it.  We recommend signing up for the patient portal called "MyChart".  Sign up information is provided on this After Visit Summary.  MyChart is used to connect with patients for Virtual Visits (Telemedicine).  Patients are able to view lab/test results, encounter notes, upcoming appointments, etc.  Non-urgent messages can be sent to your provider as well.   To learn more about what you can do with MyChart, go to [email or web address].    Your next appointment:   6 month(s)  Provider:   Christopher L Schumann, MD     

## 2022-08-19 ENCOUNTER — Telehealth: Payer: Self-pay | Admitting: Adult Health

## 2022-08-19 NOTE — Telephone Encounter (Signed)
Contacted [employer] pt assistance for refill of Hubbard's Trintellix 20mg .  It will be shipped in 3-5 business days.  He was given samples to tie him over

## 2022-08-21 ENCOUNTER — Ambulatory Visit: Payer: Medicare Other | Attending: Cardiology | Admitting: Cardiology

## 2022-08-21 ENCOUNTER — Telehealth: Payer: Self-pay | Admitting: *Deleted

## 2022-08-21 ENCOUNTER — Encounter: Payer: Self-pay | Admitting: Cardiology

## 2022-08-21 VITALS — BP 120/78 | HR 67 | Ht 71.0 in | Wt 277.4 lb

## 2022-08-21 DIAGNOSIS — I10 Essential (primary) hypertension: Secondary | ICD-10-CM | POA: Insufficient documentation

## 2022-08-21 DIAGNOSIS — G4733 Obstructive sleep apnea (adult) (pediatric): Secondary | ICD-10-CM | POA: Diagnosis not present

## 2022-08-21 NOTE — Telephone Encounter (Signed)
Order placed to [facility] via community message. 

## 2022-08-21 NOTE — Telephone Encounter (Signed)
-----   Message from [name], RN sent at 08/21/2022 10:59 AM EDT ----- Regarding: Nasal Pillow mask-compliance issues Hey-Dr. Mayford Knife is hoping he can get a nasal pillow mask ordered ASAP as he might be in danger of losing his device due to compliance issues. She says he needs to be seen today if possible.   Thomas [name]

## 2022-08-21 NOTE — Progress Notes (Signed)
Sleep Medicine CONSULT Note    Date:  08/21/2022   ID:  Thomas [name], DOB [birth year], MRN [id number]  PCP:  Emilio Aspen, MD  Cardiologist: Little Ishikawa, MD   Chief Complaint  Patient presents with   New Patient (Initial Visit)    OSA    History of Present Illness:  Thomas [name] is a 67 y.o. male who is being seen today for the evaluation of obstructive sleep apnea at the request of Epifanio Lesches, MD.  This is a 67 year old male with a history of CAD, hyperlipidemia, hypertension..  Due to his obesity and HTN as well as hx of snoring by his wife a sleep study was ordered. His sleep study demonstrated severe OSA with an AHI of 34.8/hr and O2 sats dropped to 83%. He was titrated to 16 cm H2O of CPAP.  He continued on his own CPAP device.  He is now referred to sleep medicine for consultation to establish sleep care.  He has been having problems tolerating his mask.  He uses a FF mask and has a problem getting the mask to keep a seal and it wakes him up at night. He went back to DME and has tried 9 different full face masks.  He has not tried a nasal mask or nasal pillow mask. He feels the pressure is adequate.  He denies any significant mouth or nasal dryness unless he forgets to put distilled water in the chamber.  He does not think that he snores.    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   Shortness of  breath on exertion    Ventral hernia without obstruction or gangrene 03/27/2016   Vitamin D deficiency     Past Surgical History:  Procedure Laterality Date   Epididymis Excision Left    Spermocele   HEMORROIDECTOMY     HERNIA REPAIR Bilateral 1967 and 1986   x2 open inguinal hernia repairs   INCISION AND DRAINAGE OF WOUND  2017   Wound Infection Post OP   LYMPHADENECTOMY Bilateral 05/17/2014   Procedure: PELVIC LYMPHADENECTOMY;  Surgeon: Heloise Purpura, MD;  Location: [facility];  Service: Urology;  Laterality: Bilateral;   PROSTATE BIOPSY  03/22/14   Heloise Purpura   ROBOT ASSISTED LAPAROSCOPIC RADICAL PROSTATECTOMY N/A 05/17/2014   Procedure: ROBOTIC ASSISTED LAPAROSCOPIC RADICAL PROSTATECTOMY LEVEL 2;  Surgeon: Heloise Purpura, MD;  Location: [facility];  Service: Urology;  Laterality: N/A;   SKIN TAG REMOVAL     15 lesions   VASECTOMY     VENTRAL HERNIA REPAIR N/A 03/27/2016   Procedure: LAPAROSCOPIC VENTRAL HERNIA;  Surgeon: Ovidio Kin, MD;  Location: [facility];  Service: General;  Laterality: N/A;  WITH  MESH    Current Medications: Current Meds  Medication Sig   amLODipine (NORVASC) 5 MG tablet Take 1 tablet (5 mg total) by mouth daily.   aspirin EC 81 MG tablet Take 81 mg by mouth daily. Swallow whole.   Cholecalciferol (VITAMIN D) 50 MCG (2000 UT) tablet Take 1 tablet (2,000 Units total) by mouth daily.   ezetimibe (ZETIA) 10 MG tablet Take 1 tablet (10 mg total) by mouth daily.   fluticasone (FLONASE) 50 MCG/ACT nasal spray Place 2 sprays into both nostrils daily as needed for allergies or rhinitis.   hydrALAZINE (APRESOLINE) 10 MG tablet Take 1 tablet (10 mg total) by mouth 3 (three) times daily.   olmesartan (BENICAR) 20 MG tablet Take 1 tablet (20 mg total) by mouth daily.   rosuvastatin (CRESTOR) 40 MG tablet Take 1 tablet (40 mg total) by mouth daily.   vortioxetine HBr (TRINTELLIX) 20 MG TABS tablet Take 1 tablet (20 mg total) by mouth daily.    Allergies:   Amoxicillin-pot  clavulanate and Topiramate   Social History   Socioeconomic History   Marital status: Married    Spouse name: [name]   Number of children: 2   Years of education: Not on file   Highest education level: Not on file  Occupational History   Occupation: Retired   Occupation: Retired from the car business but works part time at [employer]  Tobacco Use   Smoking status: Never   Smokeless tobacco: Never  Vaping Use   Vaping Use: Never used  Substance and Sexual Activity   Alcohol use: No    Alcohol/week: 0.0 standard drinks of alcohol   Drug use: No   Sexual activity: Yes    Partners: Male  Other Topics Concern   Not on file  Social History Narrative   Not on file   Social Determinants of Health   Financial Resource Strain: Not on file  Food Insecurity: Not on file  Transportation Needs: Not on file  Physical Activity: Not on file  Stress: Not on file  Social Connections: Not on file     Family History:  The patient's family history includes Alcohol abuse in his mother and another family member; Anxiety disorder in his mother and another family member; Bipolar disorder in his mother and another family member; COPD in his mother; Cancer in his father and mother; Depression in his mother and another family member; Drug abuse in his father and another family member; Eating disorder in his mother and another family member; Heart attack in his mother and paternal grandfather; Heart disease in his mother and another family member; Heart failure in his mother; Hyperlipidemia in his mother; Hypertension in his mother; Kidney disease in his mother and another family member; Ovarian cancer in his mother.   ROS:   Please see the history of present illness.    ROS All other systems reviewed and are negative.      No data to display             PHYSICAL EXAM:   VS:  BP 120/78   Pulse 67   Ht 5\' 11"  (1.803 m)   Wt 277 lb 6.4 oz (125.8 kg)   SpO2 98%   BMI 38.69  kg/m    GEN: Well nourished, well developed, in no acute distress  HEENT: normal  Neck: no JVD, carotid bruits, or masses Cardiac: RRR; no rubs, or gallops,no edema.  2/6 SM at RUSB to LLSB Intact distal pulses  bilaterally.  Respiratory:  clear to auscultation bilaterally, normal work of breathing GI: soft, nontender, nondistended, + BS MS: no deformity or atrophy  Skin: warm and dry, no rash Neuro:  Alert and Oriented x 3, Strength and sensation are intact Psych: euthymic mood, full affect  Wt Readings from Last 3 Encounters:  08/21/22 277 lb 6.4 oz (125.8 kg)  07/23/22 272 lb (123.4 kg)  04/29/22 270 lb (122.5 kg)      Studies/Labs Reviewed:   HST CPAP titration and PAP compliance download  Recent Labs: 01/08/2022: Magnesium 2.2 04/09/2022: TSH 2.540 04/20/2022: Hemoglobin 14.3; Platelets 155 04/28/2022: BUN 17; Creatinine, Ser 1.06; Potassium 4.8; Sodium 141    Additional studies/ records that were reviewed today include:  none    ASSESSMENT:    1. OSA (obstructive sleep apnea)   2. Essential hypertension      PLAN:  In order of problems listed above:  OSA - The patient is tolerating PAP therapy well without any problems. The PAP download performed by his DME was personally reviewed and interpreted by me today and showed an AHI of 3.3 /hr on 16 cm H2O with 10% compliance in using more than 4 hours nightly.  He is averaging 2 hours 53 minutes nightly but is only used his device 18 out of the last 30 days the patient has been using and benefiting from PAP use and will continue to benefit from therapy.  -he has struggled with the FFM and I think we should try a nasal pillow mask -Encouraged him to be more compliant with his device -I will see him back in 8 weeks  2.  Hypertension -BP controlled on exam -Continue prescription drug management with amlodipine 5 mg daily, hydralazine 10 mg 3 times daily, HCTZ 25 mg daily, Benicar 20 mg daily with as needed  refills   Medication Adjustments/Labs and Tests Ordered: Current medicines are reviewed at length with the patient today.  Concerns regarding medicines are outlined above.  Medication changes, Labs and Tests ordered today are listed in the Patient Instructions below.  There are no Patient Instructions on file for this visit.   Signed, Armanda Magic, MD  08/21/2022 10:53 AM    [facility] [address] [place], [place], [place]  [address] Phone: [phone]; Fax: [phone]

## 2022-08-21 NOTE — Patient Instructions (Signed)
Medication Instructions:  Your physician recommends that you continue on your current medications as directed. Please refer to the Current Medication list given to you today.  *If you need a refill on your cardiac medications before your next appointment, please call your pharmacy*   Lab Work: None. If you have labs (blood work) drawn today and your tests are completely normal, you will receive your results only by: MyChart Message (if you have MyChart) OR A paper copy in the mail If you have any lab test that is abnormal or we need to change your treatment, we will call you to review the results.   Testing/Procedures: None.   Follow-Up: At [facility], you and your health needs are our priority.  As part of our continuing mission to provide you with exceptional heart care, we have created designated Provider Care Teams.  These Care Teams include your primary Cardiologist (physician) and Advanced Practice Providers (APPs -  Physician Assistants and Nurse Practitioners) who all work together to provide you with the care you need, when you need it.  We recommend signing up for the patient portal called "MyChart".  Sign up information is provided on this After Visit Summary.  MyChart is used to connect with patients for Virtual Visits (Telemedicine).  Patients are able to view lab/test results, encounter notes, upcoming appointments, etc.  Non-urgent messages can be sent to your provider as well.   To learn more about what you can do with MyChart, go to [email or web address].    Your next appointment:   8 week(s)  Provider:   Dr. Armanda Magic, MD   Other Instructions Dr. Mayford Knife has sent orders to your DME company for new cpap equipment. Someone will call you to coordinate delivery.

## 2022-10-01 ENCOUNTER — Ambulatory Visit (HOSPITAL_COMMUNITY): Payer: Medicare Other | Attending: Cardiology

## 2022-10-01 DIAGNOSIS — I350 Nonrheumatic aortic (valve) stenosis: Secondary | ICD-10-CM | POA: Insufficient documentation

## 2022-10-01 LAB — ECHOCARDIOGRAM COMPLETE
AR max vel: 0.86 cm2
AV Area VTI: 0.84 cm2
AV Area mean vel: 0.86 cm2
AV Mean grad: 47 mmHg
AV Peak grad: 80.4 mmHg
Ao pk vel: 4.48 m/s
Area-P 1/2: 2.74 cm2
S' Lateral: 3 cm

## 2022-10-06 ENCOUNTER — Telehealth: Payer: Self-pay | Admitting: Cardiology

## 2022-10-06 NOTE — Telephone Encounter (Signed)
Patient is returning LPN's call for echo results. Please advise.

## 2022-10-06 NOTE — Telephone Encounter (Signed)
Left a message for the patient to call back.  

## 2022-10-06 NOTE — Telephone Encounter (Signed)
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 Pt stated he would like to kno his limit and restrictions prior to seeing Dr. Clifton James. Pt stated he doesn't want to over exert himself and cause himself to have a heart attack.   Will forward to MD for recommendations.    Thomas Ishikawa, MD 10/02/2022  6:27 AM EDT     Severe aortic stenosis, has upcoming appointment with valve clinic to discuss AV replacement

## 2022-10-07 NOTE — Telephone Encounter (Signed)
Spoke to patient Dr.Schumann's advice given.

## 2022-10-07 NOTE — Telephone Encounter (Signed)
He is fine to do his normal work but please let us know if he is having any chest pain or shortness of breath or feeling like he is going to pass out.

## 2022-10-12 ENCOUNTER — Other Ambulatory Visit: Payer: Self-pay | Admitting: Cardiology

## 2022-10-13 ENCOUNTER — Telehealth: Payer: Self-pay | Admitting: Cardiovascular Disease

## 2022-10-13 NOTE — Telephone Encounter (Signed)
Called and spoke w patient.   He will come in tomorrow to see structural APP at 9:30 am.  He is feeling fine now.  He is home from work and showered.  Appreciative for assistance today.

## 2022-10-13 NOTE — Telephone Encounter (Signed)
STAT if patient feels like he/she is going to faint   1. Are you feeling dizzy, lightheaded, or faint right now? Yes, he said he was told to call if he had any dizzy spells    2. Have you passed out?   (If yes move to .SYNCOPECHMG) no   3. Do you have any other symptoms? no   4. Have you checked your HR and BP (record if available)? Have not checked either one of these

## 2022-10-13 NOTE — Telephone Encounter (Signed)
Call transferred to triage.  Pt calling w dizzy spell that started after he'd been working outside on a ladder.  He continued about 2 hours of work on/off the ladder feeling lightheaded.   No dizziness while sitting inside for about the last 45 minutes.  He is well hydrated today.     BP is 120/70 HR 70 and regular.    Pt calling as was instructed for any symptoms that may be related to his aortic valve.  His next appointment with Dr. Clifton James is scheduled for 10/28/22.

## 2022-10-14 ENCOUNTER — Ambulatory Visit: Payer: Medicare Other | Attending: Internal Medicine | Admitting: Cardiology

## 2022-10-14 VITALS — BP 128/78 | HR 65 | Ht 71.0 in

## 2022-10-14 DIAGNOSIS — I350 Nonrheumatic aortic (valve) stenosis: Secondary | ICD-10-CM | POA: Diagnosis not present

## 2022-10-14 DIAGNOSIS — I10 Essential (primary) hypertension: Secondary | ICD-10-CM | POA: Insufficient documentation

## 2022-10-14 DIAGNOSIS — E669 Obesity, unspecified: Secondary | ICD-10-CM | POA: Insufficient documentation

## 2022-10-14 DIAGNOSIS — I2510 Atherosclerotic heart disease of native coronary artery without angina pectoris: Secondary | ICD-10-CM

## 2022-10-14 NOTE — Patient Instructions (Signed)
Medication Instructions:  Your physician recommends that you continue on your current medications as directed. Please refer to the Current Medication list given to you today.  *If you need a refill on your cardiac medications before your next appointment, please call your pharmacy*   Lab Work: Bmp, Cbc- today   If you have labs (blood work) drawn today and your tests are completely normal, you will receive your results only by: MyChart Message (if you have MyChart) OR A paper copy in the mail If you have any lab test that is abnormal or we need to change your treatment, we will call you to review the results.   Testing/Procedures: Your physician has requested that you have a cardiac catheterization. Cardiac catheterization is used to diagnose and/or treat various heart conditions. Doctors may recommend this procedure for a number of different reasons. The most common reason is to evaluate chest pain. Chest pain can be a symptom of coronary artery disease (CAD), and cardiac catheterization can show whether plaque is narrowing or blocking your heart's arteries. This procedure is also used to evaluate the valves, as well as measure the blood flow and oxygen levels in different parts of your heart. For further information please visit [email or web address] Please follow instruction sheet, as given.    Follow-Up: Follow up as scheduled   Other Instructions  [facility] A DEPT OF [place] AT [facility] [address] [place], [place] [address] [place] [address] Dept: [phone] Loc: [phone]  Thomas [name]  10/14/2022  You are scheduled for a Cardiac Catheterization on Monday, August 5 with Dr. Verne Carrow.  1. Please arrive at the [facility] (Main Entrance A) at [facility]: [address] [place], [place] [address] at 7:00 AM (This time is 2 hour(s) before your procedure to ensure your preparation). Free valet  parking service is available. You will check in at ADMITTING. The support person will be asked to wait in the waiting room.  It is OK to have someone drop you off and come back when you are ready to be discharged.    Special note: Every effort is made to have your procedure done on time. Please understand that emergencies sometimes delay scheduled procedures.  2. Diet: Do not eat solid foods after midnight.  The patient may have clear liquids until 5am upon the day of the procedure.  4. Medication instructions in preparation for your procedure:  Hold Hydrochlorothiazide the morning of your test    On the morning of your procedure, take your Aspirin 81 mg and any morning medicines NOT listed above.  You may use sips of water.  5. Plan to go home the same day, you will only stay overnight if medically necessary. 6. Bring a current list of your medications and current insurance cards. 7. You MUST have a responsible person to drive you home. 8. Someone MUST be with you the first 24 hours after you arrive home or your discharge will be delayed. 9. Please wear clothes that are easy to get on and off and wear slip-on shoes.  Thank you for allowing [place] to care for you!   -- [facility]

## 2022-10-15 NOTE — H&P (View-Only) (Signed)
[name]   MULTIDISCIPLINARY HEART VALVE CLINIC                                     Cardiology Office Note:    Date:  10/15/2022   ID:  Thomas [name], DOB 07-Jan-1956, MRN [id number]  PCP:  Emilio Aspen, MD  [name] Cardiologist:  Little Ishikawa, MD   Referring MD: Emilio Aspen, *   Chief Complaint  Patient presents with   Follow-up    Dizziness with AS   History of Present Illness:    Thomas [name] is a 67 y.o. male with a hx of anxiety, HTN, HLD, prostate cancer, obesity, and non obstructive CAD on CTA who has been followed for moderate to severe aortic stenosis who was referred to Dr. Clifton James for consideration of TAVR 04/2022.   Thomas [name] is followed by Dr. Bjorn Pippin for his cardiology care. Echocardiogram from 03/28/2018 showed EF 55 to 60%, moderate LVH, grade 1 diastolic dysfunction, normal RV function, and mild to moderate aortic stenosis. Coronary CTA with nonobstructive CAD and a calcium score 113 (87th percentile) with 50% mid LAD, less than 50% proximal Circumflex stenosis noted. By repeat echo 03/30/2022 EF remained normal however aortic stenosis noted to be more in the moderate to severe range. On follow up with Dr. Bjorn Pippin, he was noted to be asymptomatic however had been seen in the ED for chest pain and was seen back in follow up with plans for referral to the structural heart team.   At consult, Dr. Clifton James reviewed images and felt that he fell more in the moderate range with plans to follow up echocardiogram in 6 months with an office visit.   Echocardiogram performed 7/18 in anticipation for a follow up 8/14 showed progression of AS into the severe range with preserved EF, mean gradient at , peak 80.8mmHg, AVA by VTI at 0.84cm2, and DI at 0.22. He then called the office with reports of dizziness and presyncope with no other true etiology.   Today he is here with his wife. He has not had recurrent dizziness since  yesterday. He states he was standing outside holding a ladder for someone when he became very dizzy. Symptoms lasted about 1-2 hours. He is very conscientious about drinking water while working outside and does not feel that he was dehydrated. BP was normal and he is not diabetic. There was no frank syncope. Given his AS progression, we had a long discussion about proceeding with TAVR/SAVR workup which entails R/LHC and CT imaging.    He has had no chest pain, SOB, palpitations, LE edema, orthopnea, or PND. Denies bleeding in stool or urine.    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   Shortness of breath on exertion    Ventral hernia without obstruction or gangrene 03/27/2016   Vitamin D deficiency     Past Surgical History:  Procedure Laterality Date   Epididymis Excision Left    Spermocele   HEMORROIDECTOMY     HERNIA REPAIR Bilateral 1967 and 1986   x2 open inguinal hernia repairs   INCISION AND DRAINAGE OF WOUND  2017   Wound Infection Post OP   LYMPHADENECTOMY Bilateral 05/17/2014   Procedure: PELVIC LYMPHADENECTOMY;  Surgeon: Thomas Purpura, MD;  Location: [facility];  Service: Urology;  Laterality: Bilateral;   PROSTATE BIOPSY  03/22/14   Thomas [name]   ROBOT ASSISTED LAPAROSCOPIC RADICAL PROSTATECTOMY N/A 05/17/2014   Procedure: ROBOTIC ASSISTED LAPAROSCOPIC RADICAL PROSTATECTOMY LEVEL 2;  Surgeon: Thomas Purpura, MD;  Location: [facility];   Service: Urology;  Laterality: N/A;   SKIN TAG REMOVAL     15 lesions   VASECTOMY     VENTRAL HERNIA REPAIR N/A 03/27/2016   Procedure: LAPAROSCOPIC VENTRAL HERNIA;  Surgeon: Thomas Kin, MD;  Location: [facility];  Service: General;  Laterality: N/A;  WITH MESH    Current Medications: Current Meds  Medication Sig   amLODipine (NORVASC) 5 MG tablet Take 1 tablet (5 mg total) by mouth daily.   aspirin EC 81 MG tablet Take 81 mg by mouth daily. Swallow whole.   Cholecalciferol (VITAMIN D) 50 MCG (2000 UT) tablet Take 1 tablet (2,000 Units total) by mouth daily.   ezetimibe (ZETIA) 10 MG tablet TAKE ONE TABLET BY MOUTH ONE TIME DAILY   fluticasone (FLONASE) 50 MCG/ACT nasal spray Place 2 sprays into both nostrils daily as needed for allergies or rhinitis.   hydrALAZINE (APRESOLINE) 10 MG tablet Take 1 tablet (10 mg total) by mouth 3 (three) times daily.   olmesartan (BENICAR) 20 MG tablet Take 1 tablet (20 mg total) by mouth daily.   rosuvastatin (CRESTOR) 40 MG tablet Take 1 tablet (40 mg total) by mouth daily.   vortioxetine HBr (TRINTELLIX) 20 MG TABS tablet Take 1 tablet (20 mg total) by mouth daily.     Allergies:   Amoxicillin-pot clavulanate and Topiramate   Social History   Socioeconomic History   Marital status: Married    Spouse name: [name]   Number of children: 2   Years of education: Not on file   Highest education level: Not on file  Occupational History   Occupation: Retired   Occupation: Retired from the car business but works part time at [employer]  Tobacco Use   Smoking status: Never   Smokeless tobacco: Never  Vaping Use   Vaping status: Never Used  Substance and Sexual Activity   Alcohol use: No    Alcohol/week: 0.0 standard drinks of alcohol   Drug use: No   Sexual activity: Yes    Partners: Male  Other Topics Concern   Not on file  Social History Narrative   Not on file   Social Determinants of Health   Financial Resource Strain: Not  on file  Food Insecurity: Not on file  Transportation Needs: Not on file  Physical Activity: Not on file  Stress: Not on file  Social Connections: Not on file    Family History: The patient's family history includes Alcohol abuse in his mother and another family member; Anxiety disorder in his mother and another family member; Bipolar disorder in  his mother and another family member; COPD in his mother; Cancer in his father and mother; Depression in his mother and another family member; Drug abuse in his father and another family member; Eating disorder in his mother and another family member; Heart attack in his mother and paternal grandfather; Heart disease in his mother and another family member; Heart failure in his mother; Hyperlipidemia in his mother; Hypertension in his mother; Kidney disease in his mother and another family member; Ovarian cancer in his mother.  ROS:   Please see the history of present illness.    All other systems reviewed and are negative.  EKGs/Labs/Other Studies Reviewed:    The following studies were reviewed today:  Cardiac Studies & Procedures       ECHOCARDIOGRAM  ECHOCARDIOGRAM COMPLETE 10/01/2022  Narrative ECHOCARDIOGRAM REPORT    Patient Name:   Thomas [name] Date of Exam: 10/01/2022 Medical Rec #:  [id number]       Height:       71.0 in Accession #:    [id number]      Weight:       277.4 lb Date of Birth:  12/03/1955        BSA:          2.423 m Patient Age:    67 years        BP:           120/78 mmHg Patient Gender: M               HR:           62 bpm. Exam Location:  [address]  Procedure: 2D Echo, Cardiac Doppler and Color Doppler  Indications:    I35.0 AS  History:        Patient has prior history of Echocardiogram examinations, most recent 03/30/2022. AS, Arrythmias:PVC; Risk Factors:Morbid obesity, Hypertension and Dyslipidemia.  Sonographer:    Thomas [name] Referring Phys: [id number] CHRISTOPHER L  SCHUMANN  IMPRESSIONS   1. Left ventricular ejection fraction, by estimation, is 60 to 65%. The left ventricle has normal function. The left ventricle has no regional wall motion abnormalities. There is moderate concentric left ventricular hypertrophy. Left ventricular diastolic parameters are consistent with Grade I diastolic dysfunction (impaired relaxation). 2. Right ventricular systolic function is normal. The right ventricular size is normal. There is normal pulmonary artery systolic pressure. The estimated right ventricular systolic pressure is 28.6 mmHg. 3. Left atrial size was mild to moderately dilated. 4. The mitral valve is normal in structure. Trivial mitral valve regurgitation. No evidence of mitral stenosis. 5. The aortic valve is tricuspid. There is severe calcifcation of the aortic valve. Aortic valve regurgitation is not visualized. Severe aortic valve stenosis. Aortic valve area, by VTI measures 0.84 cm. Aortic valve mean gradient measures 47.0 mmHg. 6. The inferior vena cava is normal in size with greater than 50% respiratory variability, suggesting right atrial pressure of 3 mmHg.  FINDINGS Left Ventricle: Left ventricular ejection fraction, by estimation, is 60 to 65%. The left ventricle has normal function. The left ventricle has no regional wall motion abnormalities. The left ventricular internal cavity size was normal in size. There is moderate concentric left ventricular hypertrophy. Left ventricular diastolic parameters are consistent with Grade I diastolic dysfunction (impaired relaxation).  Right Ventricle: The right ventricular size is normal. No increase in right ventricular wall thickness. Right ventricular systolic function is normal. There is normal pulmonary artery systolic pressure. The tricuspid regurgitant velocity is 2.53 m/s, and  with an assumed right atrial pressure of 3 mmHg, the estimated right ventricular systolic pressure is 28.6 mmHg.  Left Atrium:  Left atrial size was mild to moderately dilated.  Right Atrium: Right atrial size was normal in size.  Pericardium: There is no evidence of pericardial effusion.  Mitral Valve: The mitral valve is normal in structure. Mild mitral annular calcification. Trivial mitral valve regurgitation. No evidence of mitral valve stenosis.  Tricuspid Valve: The tricuspid valve is normal in structure. Tricuspid valve regurgitation is trivial.  Aortic Valve: The aortic valve is tricuspid. There is severe calcifcation of the aortic valve. Aortic valve regurgitation is not visualized. Severe aortic stenosis is present. Aortic valve mean gradient measures 47.0 mmHg. Aortic valve peak gradient measures 80.4 mmHg. Aortic valve area, by VTI measures 0.84 cm.  Pulmonic Valve: The pulmonic valve was normal in structure. Pulmonic valve regurgitation is trivial.  Aorta: The aortic root is normal in size and structure.  Venous: The inferior vena cava is normal in size with greater than 50% respiratory variability, suggesting right atrial pressure of 3 mmHg.  IAS/Shunts: No atrial level shunt detected by color flow Doppler.   LEFT VENTRICLE PLAX 2D LVIDd:         5.10 cm   Diastology LVIDs:         3.00 cm   LV e' medial:    9.03 cm/s LV PW:         1.70 cm   LV E/e' medial:  9.7 LV IVS:        1.60 cm   LV e' lateral:   16.20 cm/s LVOT diam:     2.20 cm   LV E/e' lateral: 5.4 LV SV:         88 LV SV Index:   36 LVOT Area:     3.80 cm   RIGHT VENTRICLE             IVC RV S prime:     17.20 cm/s  IVC diam: 1.80 cm TAPSE (M-mode): 2.1 cm RVSP:           28.6 mmHg  LEFT ATRIUM            Index        RIGHT ATRIUM           Index LA diam:      5.00 cm  2.06 cm/m   RA Pressure: 3.00 mmHg LA Vol (A2C): 80.9 ml  33.39 ml/m  RA Area:     17.10 cm LA Vol (A4C): 108.0 ml 44.57 ml/m  RA Volume:   43.00 ml  17.75 ml/m AORTIC VALVE AV Area (Vmax):    0.86 cm AV Area (Vmean):   0.86 cm AV Area (VTI):      0.84 cm AV Vmax:           448.33 cm/s AV Vmean:          315.333 cm/s AV VTI:            1.040 m AV Peak Grad:      80.4 mmHg AV Mean Grad:      47.0 mmHg LVOT Vmax:         102.00 cm/s LVOT Vmean:        71.100 cm/s LVOT VTI:          0.231 m LVOT/AV VTI ratio: 0.22  AORTA Ao Root diam: 3.20 cm Ao Asc diam:  3.40 cm  MITRAL VALVE  TRICUSPID VALVE MV Area (PHT): 2.74 cm     TR Peak grad:   25.6 mmHg MV Decel Time: 277 msec     TR Vmax:        253.00 cm/s MV E velocity: 88.00 cm/s   Estimated RAP:  3.00 mmHg MV A velocity: 101.00 cm/s  RVSP:           28.6 mmHg MV E/A ratio:  0.87 SHUNTS Systemic VTI:  0.23 m Systemic Diam: 2.20 cm  Thomas [name] Electronically signed by Thomas [name] Signature Date/Time: 10/01/2022/2:49:29 PM    Final    MONITORS  LONG TERM MONITOR (3-14 DAYS) 06/10/2022  Narrative   Frequent PVCs (7.7%)   Patch Wear Time:  6 days and 18 hours (2024-03-14T13:19:39-0400 to 2024-03-21T07:36:45-0400)  Patient had a min HR of 45 bpm, max HR of 154 bpm, and avg HR of 66 bpm. Predominant underlying rhythm was Sinus Rhythm. Intermittent Bundle Branch Block was present. 3 Supraventricular Tachycardia runs occurred, the run with the fastest interval lasting 6 beats with a max rate of 154 bpm, the longest lasting 5 beats with an avg rate of 113 bpm. Isolated SVEs were rare (<1.0%), SVE Couplets were rare (<1.0%), and SVE Triplets were rare (<1.0%). Isolated VEs were frequent (7.7%, [id number]), VE Couplets were rare (<1.0%, 64), and VE Triplets were rare (<1.0%, 1). Ventricular Bigeminy and Trigeminy were present.   CT SCANS  CT CORONARY MORPH W/CTA COR W/SCORE 02/23/2019  Addendum 02/23/2019  9:09 PM ADDENDUM REPORT: 02/23/2019 21:07  CLINICAL DATA:  67 year old male with chest pain.  EXAM: Cardiac/Coronary  CT  TECHNIQUE: The patient was scanned on a [employer].  FINDINGS: A 120 kV prospective scan was triggered in the  descending thoracic aorta at 111 HU's. Axial non-contrast 3 mm slices were carried out through the heart. The data set was analyzed on a dedicated work station and scored using the Agatson method. Gantry rotation speed was 250 msecs and collimation was .6 mm. No beta blockade and 0.8 mg of sl NTG was given. The 3D data set was reconstructed in 5% intervals of the 67-82 % of the R-R cycle. Diastolic phases were analyzed on a dedicated work station using MPR, MIP and VRT modes. The patient received 80 cc of contrast.  Aorta: Normal size.  No calcifications.  No dissection.  Aortic Valve: Trileaflet with calcifications.  Coronary Arteries: There is no visible left main artery. The LAD and LCX have separate ostial from the aortic sinus. Left dominance.  RCA is a medium caliber vessel and does not supply the PDA. There is no plaque.  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 The LAD and LCX have separate ostial from the aortic sinus.  LAD is a large vessel that has a mid vessel 50-69% calcified plaque. Distal to the calcified plaque, there is a mid vessel minimal <25% soft/ non calcified plaque. D1 with no plaque.  LCX is a dominant artery that gives rise to one large OM1 branch. There is a proximal 25-49% calcified plaque. Mid LCX with minimal <25% calcified plaque. OM1 with no plaque.  Other findings:  Normal pulmonary vein drainage into the left atrium with no evidence of stenosis.  Normal let atrial appendage without a thrombus.  Normal size of the pulmonary artery.  IMPRESSION: 1. Coronary calcium score of 113. This was 66 percentile for age and sex matched control.  2. No Left main artery present. LAD and LCX with two separate origin. Left Dominance.  3. Non-obstructive Coronary artery disease with the most significant lesion being moderate (50-69%). CAD-RADS 3. This study will be sent for FFR.  Thomas Ripple, DO   Electronically Signed By: Thomas Ripple MD On:  02/23/2019 21:07  Narrative EXAM: OVER-READ INTERPRETATION  CT CHEST  The following report is an over-read performed by radiologist Dr. Jeronimo [name] of [name] on 02/23/2019. This over-read does not include interpretation of cardiac or coronary anatomy or pathology. The coronary CTA interpretation by the cardiologist is attached.  COMPARISON:  Chest radiograph 01/22/2019 no prior CT.  FINDINGS: Vascular: Normal aortic caliber, without evidence of dissection. No central pulmonary embolism, on this non-dedicated study.  Mediastinum/Nodes: No imaged thoracic adenopathy.  Lungs/Pleura: No pleural fluid.  Clear imaged lungs.  Upper Abdomen: Normal imaged portions of the liver, spleen, stomach.  Musculoskeletal: Midthoracic spondylosis.  IMPRESSION: No acute findings in the imaged extracardiac chest.  Electronically Signed: By: Thomas [name] M.D. On: 02/23/2019 12:11          EKG:  EKG is ordered today.  The ekg ordered today demonstrates NSR with HR 65bpm with inferior TW abnormalities although present on prior tracings.    Recent Labs: 01/08/2022: Magnesium 2.2 04/09/2022: TSH 2.540 10/14/2022: BUN 22; Creatinine, Ser 0.90; Hemoglobin 15.7; Platelets 167; Potassium 4.4; Sodium 137  Recent Lipid Panel    Component Value Date/Time   CHOL 105 01/08/2022 1034   TRIG 83 01/08/2022 1034   HDL 40 01/08/2022 1034   CHOLHDL 2.6 01/08/2022 1034   LDLCALC 48 01/08/2022 1034   Physical Exam:    VS:  BP 128/78   Pulse 65   Ht 5\' 11"  (1.803 m)   BMI 38.69 kg/m     Wt Readings from Last 3 Encounters:  08/21/22 277 lb 6.4 oz (125.8 kg)  07/23/22 272 lb (123.4 kg)  04/29/22 270 lb (122.5 kg)    General: Well developed, well nourished, NAD Lungs:Clear to ausculation bilaterally. No wheezes, rales, or rhonchi. Breathing is unlabored. Cardiovascular: RRR with S1 S2. + murmur Extremities: No edema.  Neuro: Alert and oriented. No focal deficits. No facial  asymmetry. MAE spontaneously. Psych: Responds to questions appropriately with normal affect.    ASSESSMENT/PLAN:    Severe aortic stenosis: Most recent echo with progression of AS to the severe range with normal LV function, mean gradient at , peak 80.50mmHg, AVA by VTI at 0.84cm2, and DI at 0.22. Previously seen in [facility] consultation 04/2022 however patient noted to be asymptomatic at that time with only moderate AS on echo. Given episode of isolated dizziness, plan to move forward with [facility] for consideration of TAVR/SAVR. Obtain BMET, CBC today   Cardiac catheterization was discussed with the patient fully. The patient understands that risks include but are not limited to stroke (1 in 1000), death (1 in 1000), kidney failure [usually temporary] (1 in 500), bleeding (1 in 200), allergic reaction [possibly serious] (1 in 200).  The patient understands and is willing to proceed.     Obesity: BSA at 2.51cm2.   HTN: Stable with no changes today  Nonobstructive CAD: Coronary CTA with nonobstructive CAD and a calcium score 113 (87th percentile) with 50% mid LAD, less than 50% proximal Circumflex stenosis noted from 2020. No anginal symptoms.   Medication Adjustments/Labs and Tests Ordered: Current medicines are reviewed at length with the patient today.  Concerns regarding medicines are outlined above.  Orders Placed This Encounter  Procedures   Basic metabolic panel   CBC   EKG  12-Lead   No orders of the defined types were placed in this encounter.   Patient Instructions  Medication Instructions:  Your physician recommends that you continue on your current medications as directed. Please refer to the Current Medication list given to you today.  *If you need a refill on your cardiac medications before your next appointment, please call your pharmacy*   Lab Work: Bmp, Cbc- today   If you have labs (blood work) drawn today and your tests are completely normal, you will receive your  results only by: MyChart Message (if you have MyChart) OR A paper copy in the mail If you have any lab test that is abnormal or we need to change your treatment, we will call you to review the results.   Testing/Procedures: Your physician has requested that you have a cardiac catheterization. Cardiac catheterization is used to diagnose and/or treat various heart conditions. Doctors may recommend this procedure for a number of different reasons. The most common reason is to evaluate chest pain. Chest pain can be a symptom of coronary artery disease (CAD), and cardiac catheterization can show whether plaque is narrowing or blocking your heart's arteries. This procedure is also used to evaluate the valves, as well as measure the blood flow and oxygen levels in different parts of your heart. For further information please visit [email or web address] Please follow instruction sheet, as given.    Follow-Up: Follow up as scheduled   Other Instructions  [name] OF [facility] AT [name] [address] [place], [place] [address] [place] [address] Dept: [phone] Loc: [phone]  Thomas [name]  10/14/2022  You are scheduled for a Cardiac Catheterization on Monday, August 5 with Dr. Verne Carrow.  1. Please arrive at the [facility] (Main Entrance A) at [facility]: [address] [place], [place] [address] at 7:00 AM (This time is 2 hour(s) before your procedure to ensure your preparation). Free valet parking service is available. You will check in at ADMITTING. The support person will be asked to wait in the waiting room.  It is OK to have someone drop you off and come back when you are ready to be discharged.    Special note: Every effort is made to have your procedure done on time. Please understand that emergencies sometimes delay scheduled procedures.  2. Diet: Do not eat solid foods after midnight.  The  patient may have clear liquids until 5am upon the day of the procedure.  4. Medication instructions in preparation for your procedure:  Hold Hydrochlorothiazide the morning of your test    On the morning of your procedure, take your Aspirin 81 mg and any morning medicines NOT listed above.  You may use sips of water.  5. Plan to go home the same day, you will only stay overnight if medically necessary. 6. Bring a current list of your medications and current insurance cards. 7. You MUST have a responsible person to drive you home. 8. Someone MUST be with you the first 24 hours after you arrive home or your discharge will be delayed. 9. Please wear clothes that are easy to get on and off and wear slip-on shoes.  Thank you for allowing [place] to care for you!   -- [name] services    Signed, Georgie Chard, NP  10/15/2022 10:02 AM    [facility]

## 2022-10-15 NOTE — Progress Notes (Signed)
[name]   MULTIDISCIPLINARY HEART VALVE CLINIC                                     Cardiology Office Note:    Date:  10/15/2022   ID:  [name], DOB 07-Jan-1956, MRN [id number]  PCP:  Emilio Aspen, MD  [name] Cardiologist:  Little Ishikawa, MD   Referring MD: Emilio Aspen, *   Chief Complaint  Patient presents with   Follow-up    Dizziness with AS   History of Present Illness:    Thomas [name] is a 67 y.o. male with a hx of anxiety, HTN, HLD, prostate cancer, obesity, and non obstructive CAD on CTA who has been followed for moderate to severe aortic stenosis who was referred to Dr. Clifton James for consideration of TAVR 04/2022.   Thomas [name] is followed by Dr. Bjorn Pippin for his cardiology care. Echocardiogram from 03/28/2018 showed EF 55 to 60%, moderate LVH, grade 1 diastolic dysfunction, normal RV function, and mild to moderate aortic stenosis. Coronary CTA with nonobstructive CAD and a calcium score 113 (87th percentile) with 50% mid LAD, less than 50% proximal Circumflex stenosis noted. By repeat echo 03/30/2022 EF remained normal however aortic stenosis noted to be more in the moderate to severe range. On follow up with Dr. Bjorn Pippin, he was noted to be asymptomatic however had been seen in the ED for chest pain and was seen back in follow up with plans for referral to the structural heart team.   At consult, Dr. Clifton James reviewed images and felt that he fell more in the moderate range with plans to follow up echocardiogram in 6 months with an office visit.   Echocardiogram performed 7/18 in anticipation for a follow up 8/14 showed progression of AS into the severe range with preserved EF, mean gradient at , peak 80.8mmHg, AVA by VTI at 0.84cm2, and DI at 0.22. He then called the office with reports of dizziness and presyncope with no other true etiology.   Today he is here with his wife. He has not had recurrent dizziness since  yesterday. He states he was standing outside holding a ladder for someone when he became very dizzy. Symptoms lasted about 1-2 hours. He is very conscientious about drinking water while working outside and does not feel that he was dehydrated. BP was normal and he is not diabetic. There was no frank syncope. Given his AS progression, we had a long discussion about proceeding with TAVR/SAVR workup which entails R/LHC and CT imaging.    He has had no chest pain, SOB, palpitations, LE edema, orthopnea, or PND. Denies bleeding in stool or urine.    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   Shortness of breath on exertion    Ventral hernia without obstruction or gangrene 03/27/2016   Vitamin D deficiency     Past Surgical History:  Procedure Laterality Date   Epididymis Excision Left    Spermocele   HEMORROIDECTOMY     HERNIA REPAIR Bilateral 1967 and 1986   x2 open inguinal hernia repairs   INCISION AND DRAINAGE OF WOUND  2017   Wound Infection Post OP   LYMPHADENECTOMY Bilateral 05/17/2014   Procedure: PELVIC LYMPHADENECTOMY;  Surgeon: Heloise Purpura, MD;  Location: [facility];  Service: Urology;  Laterality: Bilateral;   PROSTATE BIOPSY  03/22/14   Heloise Purpura   ROBOT ASSISTED LAPAROSCOPIC RADICAL PROSTATECTOMY N/A 05/17/2014   Procedure: ROBOTIC ASSISTED LAPAROSCOPIC RADICAL PROSTATECTOMY LEVEL 2;  Surgeon: Heloise Purpura, MD;  Location: [facility];   Service: Urology;  Laterality: N/A;   SKIN TAG REMOVAL     15 lesions   VASECTOMY     VENTRAL HERNIA REPAIR N/A 03/27/2016   Procedure: LAPAROSCOPIC VENTRAL HERNIA;  Surgeon: Ovidio Kin, MD;  Location: [facility];  Service: General;  Laterality: N/A;  WITH MESH    Current Medications: Current Meds  Medication Sig   amLODipine (NORVASC) 5 MG tablet Take 1 tablet (5 mg total) by mouth daily.   aspirin EC 81 MG tablet Take 81 mg by mouth daily. Swallow whole.   Cholecalciferol (VITAMIN D) 50 MCG (2000 UT) tablet Take 1 tablet (2,000 Units total) by mouth daily.   ezetimibe (ZETIA) 10 MG tablet TAKE ONE TABLET BY MOUTH ONE TIME DAILY   fluticasone (FLONASE) 50 MCG/ACT nasal spray Place 2 sprays into both nostrils daily as needed for allergies or rhinitis.   hydrALAZINE (APRESOLINE) 10 MG tablet Take 1 tablet (10 mg total) by mouth 3 (three) times daily.   olmesartan (BENICAR) 20 MG tablet Take 1 tablet (20 mg total) by mouth daily.   rosuvastatin (CRESTOR) 40 MG tablet Take 1 tablet (40 mg total) by mouth daily.   vortioxetine HBr (TRINTELLIX) 20 MG TABS tablet Take 1 tablet (20 mg total) by mouth daily.     Allergies:   Amoxicillin-pot clavulanate and Topiramate   Social History   Socioeconomic History   Marital status: Married    Spouse name: [name]   Number of children: 2   Years of education: Not on file   Highest education level: Not on file  Occupational History   Occupation: Retired   Occupation: Retired from the car business but works part time at [employer]  Tobacco Use   Smoking status: Never   Smokeless tobacco: Never  Vaping Use   Vaping status: Never Used  Substance and Sexual Activity   Alcohol use: No    Alcohol/week: 0.0 standard drinks of alcohol   Drug use: No   Sexual activity: Yes    Partners: Male  Other Topics Concern   Not on file  Social History Narrative   Not on file   Social Determinants of Health   Financial Resource Strain: Not  on file  Food Insecurity: Not on file  Transportation Needs: Not on file  Physical Activity: Not on file  Stress: Not on file  Social Connections: Not on file    Family History: The patient's family history includes Alcohol abuse in his mother and another family member; Anxiety disorder in his mother and another family member; Bipolar disorder in  his mother and another family member; COPD in his mother; Cancer in his father and mother; Depression in his mother and another family member; Drug abuse in his father and another family member; Eating disorder in his mother and another family member; Heart attack in his mother and paternal grandfather; Heart disease in his mother and another family member; Heart failure in his mother; Hyperlipidemia in his mother; Hypertension in his mother; Kidney disease in his mother and another family member; Ovarian cancer in his mother.  ROS:   Please see the history of present illness.    All other systems reviewed and are negative.  EKGs/Labs/Other Studies Reviewed:    The following studies were reviewed today:  Cardiac Studies & Procedures       ECHOCARDIOGRAM  ECHOCARDIOGRAM COMPLETE 10/01/2022  Narrative ECHOCARDIOGRAM REPORT    Patient Name:   RAUNEL UEHARA Verbeke Date of Exam: 10/01/2022 Medical Rec #:  [id number]       Height:       71.0 in Accession #:    [id number]      Weight:       277.4 lb Date of Birth:  12/03/1955        BSA:          2.423 m Patient Age:    67 years        BP:           120/78 mmHg Patient Gender: M               HR:           62 bpm. Exam Location:  [address]  Procedure: 2D Echo, Cardiac Doppler and Color Doppler  Indications:    I35.0 AS  History:        Patient has prior history of Echocardiogram examinations, most recent 03/30/2022. AS, Arrythmias:PVC; Risk Factors:Morbid obesity, Hypertension and Dyslipidemia.  Sonographer:    Samule Ohm RDCS Referring Phys: [id number] CHRISTOPHER L  SCHUMANN  IMPRESSIONS   1. Left ventricular ejection fraction, by estimation, is 60 to 65%. The left ventricle has normal function. The left ventricle has no regional wall motion abnormalities. There is moderate concentric left ventricular hypertrophy. Left ventricular diastolic parameters are consistent with Grade I diastolic dysfunction (impaired relaxation). 2. Right ventricular systolic function is normal. The right ventricular size is normal. There is normal pulmonary artery systolic pressure. The estimated right ventricular systolic pressure is 28.6 mmHg. 3. Left atrial size was mild to moderately dilated. 4. The mitral valve is normal in structure. Trivial mitral valve regurgitation. No evidence of mitral stenosis. 5. The aortic valve is tricuspid. There is severe calcifcation of the aortic valve. Aortic valve regurgitation is not visualized. Severe aortic valve stenosis. Aortic valve area, by VTI measures 0.84 cm. Aortic valve mean gradient measures 47.0 mmHg. 6. The inferior vena cava is normal in size with greater than 50% respiratory variability, suggesting right atrial pressure of 3 mmHg.  FINDINGS Left Ventricle: Left ventricular ejection fraction, by estimation, is 60 to 65%. The left ventricle has normal function. The left ventricle has no regional wall motion abnormalities. The left ventricular internal cavity size was normal in size. There is moderate concentric left ventricular hypertrophy. Left ventricular diastolic parameters are consistent with Grade I diastolic dysfunction (impaired relaxation).  Right Ventricle: The right ventricular size is normal. No increase in right ventricular wall thickness. Right ventricular systolic function is normal. There is normal pulmonary artery systolic pressure. The tricuspid regurgitant velocity is 2.53 m/s, and  with an assumed right atrial pressure of 3 mmHg, the estimated right ventricular systolic pressure is 28.6 mmHg.  Left Atrium:  Left atrial size was mild to moderately dilated.  Right Atrium: Right atrial size was normal in size.  Pericardium: There is no evidence of pericardial effusion.  Mitral Valve: The mitral valve is normal in structure. Mild mitral annular calcification. Trivial mitral valve regurgitation. No evidence of mitral valve stenosis.  Tricuspid Valve: The tricuspid valve is normal in structure. Tricuspid valve regurgitation is trivial.  Aortic Valve: The aortic valve is tricuspid. There is severe calcifcation of the aortic valve. Aortic valve regurgitation is not visualized. Severe aortic stenosis is present. Aortic valve mean gradient measures 47.0 mmHg. Aortic valve peak gradient measures 80.4 mmHg. Aortic valve area, by VTI measures 0.84 cm.  Pulmonic Valve: The pulmonic valve was normal in structure. Pulmonic valve regurgitation is trivial.  Aorta: The aortic root is normal in size and structure.  Venous: The inferior vena cava is normal in size with greater than 50% respiratory variability, suggesting right atrial pressure of 3 mmHg.  IAS/Shunts: No atrial level shunt detected by color flow Doppler.   LEFT VENTRICLE PLAX 2D LVIDd:         5.10 cm   Diastology LVIDs:         3.00 cm   LV e' medial:    9.03 cm/s LV PW:         1.70 cm   LV E/e' medial:  9.7 LV IVS:        1.60 cm   LV e' lateral:   16.20 cm/s LVOT diam:     2.20 cm   LV E/e' lateral: 5.4 LV SV:         88 LV SV Index:   36 LVOT Area:     3.80 cm   RIGHT VENTRICLE             IVC RV S prime:     17.20 cm/s  IVC diam: 1.80 cm TAPSE (M-mode): 2.1 cm RVSP:           28.6 mmHg  LEFT ATRIUM            Index        RIGHT ATRIUM           Index LA diam:      5.00 cm  2.06 cm/m   RA Pressure: 3.00 mmHg LA Vol (A2C): 80.9 ml  33.39 ml/m  RA Area:     17.10 cm LA Vol (A4C): 108.0 ml 44.57 ml/m  RA Volume:   43.00 ml  17.75 ml/m AORTIC VALVE AV Area (Vmax):    0.86 cm AV Area (Vmean):   0.86 cm AV Area (VTI):      0.84 cm AV Vmax:           448.33 cm/s AV Vmean:          315.333 cm/s AV VTI:            1.040 m AV Peak Grad:      80.4 mmHg AV Mean Grad:      47.0 mmHg LVOT Vmax:         102.00 cm/s LVOT Vmean:        71.100 cm/s LVOT VTI:          0.231 m LVOT/AV VTI ratio: 0.22  AORTA Ao Root diam: 3.20 cm Ao Asc diam:  3.40 cm  MITRAL VALVE  TRICUSPID VALVE MV Area (PHT): 2.74 cm     TR Peak grad:   25.6 mmHg MV Decel Time: 277 msec     TR Vmax:        253.00 cm/s MV E velocity: 88.00 cm/s   Estimated RAP:  3.00 mmHg MV A velocity: 101.00 cm/s  RVSP:           28.6 mmHg MV E/A ratio:  0.87 SHUNTS Systemic VTI:  0.23 m Systemic Diam: 2.20 cm  Dalton McleanMD Electronically signed by Wilfred Lacy Signature Date/Time: 10/01/2022/2:49:29 PM    Final    MONITORS  LONG TERM MONITOR (3-14 DAYS) 06/10/2022  Narrative   Frequent PVCs (7.7%)   Patch Wear Time:  6 days and 18 hours (2024-03-14T13:19:39-0400 to 2024-03-21T07:36:45-0400)  Patient had a min HR of 45 bpm, max HR of 154 bpm, and avg HR of 66 bpm. Predominant underlying rhythm was Sinus Rhythm. Intermittent Bundle Branch Block was present. 3 Supraventricular Tachycardia runs occurred, the run with the fastest interval lasting 6 beats with a max rate of 154 bpm, the longest lasting 5 beats with an avg rate of 113 bpm. Isolated SVEs were rare (<1.0%), SVE Couplets were rare (<1.0%), and SVE Triplets were rare (<1.0%). Isolated VEs were frequent (7.7%, 48429), VE Couplets were rare (<1.0%, 64), and VE Triplets were rare (<1.0%, 1). Ventricular Bigeminy and Trigeminy were present.   CT SCANS  CT CORONARY MORPH W/CTA COR W/SCORE 02/23/2019  Addendum 02/23/2019  9:09 PM ADDENDUM REPORT: 02/23/2019 21:07  CLINICAL DATA:  67 year old male with chest pain.  EXAM: Cardiac/Coronary  CT  TECHNIQUE: The patient was scanned on a [employer].  FINDINGS: A 120 kV prospective scan was triggered in the  descending thoracic aorta at 111 HU's. Axial non-contrast 3 mm slices were carried out through the heart. The data set was analyzed on a dedicated work station and scored using the Agatson method. Gantry rotation speed was 250 msecs and collimation was .6 mm. No beta blockade and 0.8 mg of sl NTG was given. The 3D data set was reconstructed in 5% intervals of the 67-82 % of the R-R cycle. Diastolic phases were analyzed on a dedicated work station using MPR, MIP and VRT modes. The patient received 80 cc of contrast.  Aorta: Normal size.  No calcifications.  No dissection.  Aortic Valve: Trileaflet with calcifications.  Coronary Arteries: There is no visible left main artery. The LAD and LCX have separate ostial from the aortic sinus. Left dominance.  RCA is a medium caliber vessel and does not supply the PDA. There is no plaque.  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 The LAD and LCX have separate ostial from the aortic sinus.  LAD is a large vessel that has a mid vessel 50-69% calcified plaque. Distal to the calcified plaque, there is a mid vessel minimal <25% soft/ non calcified plaque. D1 with no plaque.  LCX is a dominant artery that gives rise to one large OM1 branch. There is a proximal 25-49% calcified plaque. Mid LCX with minimal <25% calcified plaque. OM1 with no plaque.  Other findings:  Normal pulmonary vein drainage into the left atrium with no evidence of stenosis.  Normal let atrial appendage without a thrombus.  Normal size of the pulmonary artery.  IMPRESSION: 1. Coronary calcium score of 113. This was 66 percentile for age and sex matched control.  2. No Left main artery present. LAD and LCX with two separate origin. Left Dominance.  3. Non-obstructive Coronary artery disease with the most significant lesion being moderate (50-69%). CAD-RADS 3. This study will be sent for FFR.  Thomasene Ripple, DO   Electronically Signed By: Thomasene Ripple MD On:  02/23/2019 21:07  Narrative EXAM: OVER-READ INTERPRETATION  CT CHEST  The following report is an over-read performed by radiologist Dr. Jeronimo Greaves of [name] on 02/23/2019. This over-read does not include interpretation of cardiac or coronary anatomy or pathology. The coronary CTA interpretation by the cardiologist is attached.  COMPARISON:  Chest radiograph 01/22/2019 no prior CT.  FINDINGS: Vascular: Normal aortic caliber, without evidence of dissection. No central pulmonary embolism, on this non-dedicated study.  Mediastinum/Nodes: No imaged thoracic adenopathy.  Lungs/Pleura: No pleural fluid.  Clear imaged lungs.  Upper Abdomen: Normal imaged portions of the liver, spleen, stomach.  Musculoskeletal: Midthoracic spondylosis.  IMPRESSION: No acute findings in the imaged extracardiac chest.  Electronically Signed: By: Jeronimo Greaves M.D. On: 02/23/2019 12:11          EKG:  EKG is ordered today.  The ekg ordered today demonstrates NSR with HR 65bpm with inferior TW abnormalities although present on prior tracings.    Recent Labs: 01/08/2022: Magnesium 2.2 04/09/2022: TSH 2.540 10/14/2022: BUN 22; Creatinine, Ser 0.90; Hemoglobin 15.7; Platelets 167; Potassium 4.4; Sodium 137  Recent Lipid Panel    Component Value Date/Time   CHOL 105 01/08/2022 1034   TRIG 83 01/08/2022 1034   HDL 40 01/08/2022 1034   CHOLHDL 2.6 01/08/2022 1034   LDLCALC 48 01/08/2022 1034   Physical Exam:    VS:  BP 128/78   Pulse 65   Ht 5\' 11"  (1.803 m)   BMI 38.69 kg/m     Wt Readings from Last 3 Encounters:  08/21/22 277 lb 6.4 oz (125.8 kg)  07/23/22 272 lb (123.4 kg)  04/29/22 270 lb (122.5 kg)    General: Well developed, well nourished, NAD Lungs:Clear to ausculation bilaterally. No wheezes, rales, or rhonchi. Breathing is unlabored. Cardiovascular: RRR with S1 S2. + murmur Extremities: No edema.  Neuro: Alert and oriented. No focal deficits. No facial  asymmetry. MAE spontaneously. Psych: Responds to questions appropriately with normal affect.    ASSESSMENT/PLAN:    Severe aortic stenosis: Most recent echo with progression of AS to the severe range with normal LV function, mean gradient at , peak 80.50mmHg, AVA by VTI at 0.84cm2, and DI at 0.22. Previously seen in [facility] consultation 04/2022 however patient noted to be asymptomatic at that time with only moderate AS on echo. Given episode of isolated dizziness, plan to move forward with [facility] for consideration of TAVR/SAVR. Obtain BMET, CBC today   Cardiac catheterization was discussed with the patient fully. The patient understands that risks include but are not limited to stroke (1 in 1000), death (1 in 1000), kidney failure [usually temporary] (1 in 500), bleeding (1 in 200), allergic reaction [possibly serious] (1 in 200).  The patient understands and is willing to proceed.     Obesity: BSA at 2.51cm2.   HTN: Stable with no changes today  Nonobstructive CAD: Coronary CTA with nonobstructive CAD and a calcium score 113 (87th percentile) with 50% mid LAD, less than 50% proximal Circumflex stenosis noted from 2020. No anginal symptoms.   Medication Adjustments/Labs and Tests Ordered: Current medicines are reviewed at length with the patient today.  Concerns regarding medicines are outlined above.  Orders Placed This Encounter  Procedures   Basic metabolic panel   CBC   EKG  12-Lead   No orders of the defined types were placed in this encounter.   Patient Instructions  Medication Instructions:  Your physician recommends that you continue on your current medications as directed. Please refer to the Current Medication list given to you today.  *If you need a refill on your cardiac medications before your next appointment, please call your pharmacy*   Lab Work: Bmp, Cbc- today   If you have labs (blood work) drawn today and your tests are completely normal, you will receive your  results only by: MyChart Message (if you have MyChart) OR A paper copy in the mail If you have any lab test that is abnormal or we need to change your treatment, we will call you to review the results.   Testing/Procedures: Your physician has requested that you have a cardiac catheterization. Cardiac catheterization is used to diagnose and/or treat various heart conditions. Doctors may recommend this procedure for a number of different reasons. The most common reason is to evaluate chest pain. Chest pain can be a symptom of coronary artery disease (CAD), and cardiac catheterization can show whether plaque is narrowing or blocking your heart's arteries. This procedure is also used to evaluate the valves, as well as measure the blood flow and oxygen levels in different parts of your heart. For further information please visit [email or web address] Please follow instruction sheet, as given.    Follow-Up: Follow up as scheduled   Other Instructions  [name] DE[facility] OF [facility] AT [name] [address] [place], [place] [address] [place] [address] Dept: [phone] Loc: [phone]  HERSHELL BOWMER  10/14/2022  You are scheduled for a Cardiac Catheterization on Monday, August 5 with Dr. Verne Carrow.  1. Please arrive at the [facility] (Main Entrance A) at [facility]: [address] [place], [place] [address] at 7:00 AM (This time is 2 hour(s) before your procedure to ensure your preparation). Free valet parking service is available. You will check in at ADMITTING. The support person will be asked to wait in the waiting room.  It is OK to have someone drop you off and come back when you are ready to be discharged.    Special note: Every effort is made to have your procedure done on time. Please understand that emergencies sometimes delay scheduled procedures.  2. Diet: Do not eat solid foods after midnight.  The  patient may have clear liquids until 5am upon the day of the procedure.  4. Medication instructions in preparation for your procedure:  Hold Hydrochlorothiazide the morning of your test    On the morning of your procedure, take your Aspirin 81 mg and any morning medicines NOT listed above.  You may use sips of water.  5. Plan to go home the same day, you will only stay overnight if medically necessary. 6. Bring a current list of your medications and current insurance cards. 7. You MUST have a responsible person to drive you home. 8. Someone MUST be with you the first 24 hours after you arrive home or your discharge will be delayed. 9. Please wear clothes that are easy to get on and off and wear slip-on shoes.  Thank you for allowing [place] to care for you!   -- [name] services    Signed, Georgie Chard, NP  10/15/2022 10:02 AM    [facility]

## 2022-10-16 ENCOUNTER — Encounter: Payer: Self-pay | Admitting: Cardiology

## 2022-10-16 ENCOUNTER — Ambulatory Visit: Payer: Medicare Other | Attending: Cardiology | Admitting: Cardiology

## 2022-10-16 VITALS — BP 120/72 | HR 66 | Ht 71.0 in | Wt 277.2 lb

## 2022-10-16 DIAGNOSIS — G4733 Obstructive sleep apnea (adult) (pediatric): Secondary | ICD-10-CM | POA: Diagnosis present

## 2022-10-16 DIAGNOSIS — I10 Essential (primary) hypertension: Secondary | ICD-10-CM | POA: Insufficient documentation

## 2022-10-16 NOTE — Progress Notes (Signed)
N

## 2022-10-16 NOTE — Patient Instructions (Signed)
Medication Instructions:  Your physician recommends that you continue on your current medications as directed. Please refer to the Current Medication list given to you today.  *If you need a refill on your cardiac medications before your next appointment, please call your pharmacy*   Lab Work: None.  If you have labs (blood work) drawn today and your tests are completely normal, you will receive your results only by: MyChart Message (if you have MyChart) OR A paper copy in the mail If you have any lab test that is abnormal or we need to change your treatment, we will call you to review the results.   Testing/Procedures: None.   Follow-Up: At  HeartCare, you and your health needs are our priority.  As part of our continuing mission to provide you with exceptional heart care, we have created designated Provider Care Teams.  These Care Teams include your primary Cardiologist (physician) and Advanced Practice Providers (APPs -  Physician Assistants and Nurse Practitioners) who all work together to provide you with the care you need, when you need it.  We recommend signing up for the patient portal called "MyChart".  Sign up information is provided on this After Visit Summary.  MyChart is used to connect with patients for Virtual Visits (Telemedicine).  Patients are able to view lab/test results, encounter notes, upcoming appointments, etc.  Non-urgent messages can be sent to your provider as well.   To learn more about what you can do with MyChart, go to [email or web address].    Your next appointment:   1 year(s)  Provider:   Dr. Traci Turner, MD   

## 2022-10-16 NOTE — Progress Notes (Signed)
Sleep Medicine  Note    Date:  10/16/2022   ID:  Thomas [name], DOB 1956/01/29, MRN [id number]  PCP:  Emilio Aspen, MD  Cardiologist: Little Ishikawa, MD   Chief Complaint  Patient presents with   Sleep Apnea   Hypertension    History of Present Illness:  Thomas [name] is a 67 y.o. male  with a history of CAD, hyperlipidemia, hypertension..  Due to his obesity and HTN as well as hx of snoring by his wife a sleep study was ordered. His sleep study demonstrated severe OSA with an AHI of 34.8/hr and O2 sats dropped to 83%. He was titrated to 16 cm H2O of CPAP.  He continued on his own CPAP device.    He is doing well with his PAP device and thinks that he has gotten used to it.  He tolerates the nasal pillow mask and feels the pressure is adequate.  Since going on PAP he feels rested in the am and has no significant daytime sleepiness.  He denies any significant mouth or nasal dryness or nasal congestion.  He does not think that he snores.    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   Shortness of breath on exertion    Ventral hernia without obstruction or gangrene 03/27/2016   Vitamin D deficiency     Past Surgical History:  Procedure Laterality Date   Epididymis Excision Left    Spermocele   HEMORROIDECTOMY     HERNIA REPAIR Bilateral 1967 and 1986   x2 open inguinal hernia repairs   INCISION AND DRAINAGE OF  WOUND  2017   Wound Infection Post OP   LYMPHADENECTOMY Bilateral 05/17/2014   Procedure: PELVIC LYMPHADENECTOMY;  Surgeon: Heloise Purpura, MD;  Location: [facility];  Service: Urology;  Laterality: Bilateral;   PROSTATE BIOPSY  03/22/14   Heloise Purpura   ROBOT ASSISTED LAPAROSCOPIC RADICAL PROSTATECTOMY N/A 05/17/2014   Procedure: ROBOTIC ASSISTED LAPAROSCOPIC RADICAL PROSTATECTOMY LEVEL 2;  Surgeon: Heloise Purpura, MD;  Location: [facility];  Service: Urology;  Laterality: N/A;   SKIN TAG REMOVAL     15 lesions   VASECTOMY     VENTRAL HERNIA REPAIR N/A 03/27/2016   Procedure: LAPAROSCOPIC VENTRAL HERNIA;  Surgeon: Ovidio Kin, MD;  Location: [facility];  Service: General;  Laterality: N/A;  WITH MESH    Current Medications: Current Meds  Medication Sig   amLODipine (NORVASC) 5 MG tablet Take 1 tablet (5 mg total) by mouth daily.   aspirin EC 81 MG tablet Take 81 mg by mouth daily. Swallow whole.   Biotin 1000 MCG tablet Take 1,000 mcg by mouth daily.   Boswellia-Glucosamine-Vit D (GLUCOSAMINE COMPLEX -BOSWELLIA PO)  Take 1,500 mg by mouth daily.   Cholecalciferol (VITAMIN D-1000 MAX ST) 25 MCG (1000 UT) tablet Take 1,000 Units by mouth daily.   Coenzyme Q10 (CO Q 10) 100 MG CAPS Take by mouth.   ezetimibe (ZETIA) 10 MG tablet TAKE ONE TABLET BY MOUTH ONE TIME DAILY   fluticasone (FLONASE) 50 MCG/ACT nasal spray Place 1 spray into both nostrils at bedtime as needed for allergies or rhinitis.   Garlic 1000 MG CAPS Take 1,000 mg by mouth daily.   hydrALAZINE (APRESOLINE) 10 MG tablet Take 1 tablet (10 mg total) by mouth 3 (three) times daily.   hydrochlorothiazide (HYDRODIURIL) 25 MG tablet Take 1 tablet (25 mg total) by mouth daily.   Multiple Vitamins-Minerals (MULTIVITAMIN WITH MINERALS) tablet Take 1 tablet by mouth daily. Men   olmesartan (BENICAR) 20 MG tablet Take 1 tablet (20 mg total) by mouth daily.   Omega-3 Fatty Acids (OMEGA 3 PO) Take 1,500 mg by mouth daily.   rosuvastatin (CRESTOR) 40 MG  tablet Take 1 tablet (40 mg total) by mouth daily.   vortioxetine HBr (TRINTELLIX) 20 MG TABS tablet Take 1 tablet (20 mg total) by mouth daily.    Allergies:   Amoxicillin-pot clavulanate and Topiramate   Social History   Socioeconomic History   Marital status: Married    Spouse name: [name]   Number of children: 2   Years of education: Not on file   Highest education level: Not on file  Occupational History   Occupation: Retired   Occupation: Retired from the car business but works part time at [employer]  Tobacco Use   Smoking status: Never   Smokeless tobacco: Never  Vaping Use   Vaping status: Never Used  Substance and Sexual Activity   Alcohol use: No    Alcohol/week: 0.0 standard drinks of alcohol   Drug use: No   Sexual activity: Yes    Partners: Male  Other Topics Concern   Not on file  Social History Narrative   Not on file   Social Determinants of Health   Financial Resource Strain: Not on file  Food Insecurity: Not on file  Transportation Needs: Not on file  Physical Activity: Not on file  Stress: Not on file  Social Connections: Not on file     Family History:  The patient's family history includes Alcohol abuse in his mother and another family member; Anxiety disorder in his mother and another family member; Bipolar disorder in his mother and another family member; COPD in his mother; Cancer in his father and mother; Depression in his mother and another family member; Drug abuse in his father and another family member; Eating disorder in his mother and another family member; Heart attack in his mother and paternal grandfather; Heart disease in his mother and another family member; Heart failure in his mother; Hyperlipidemia in his mother; Hypertension in his mother; Kidney disease in his mother and another family member; Ovarian cancer in his mother.   ROS:   Please see the history of present illness.    ROS All other systems reviewed and are  negative.      No data to display             PHYSICAL EXAM:   VS:  BP 120/72   Pulse 66   Ht 5\' 11"  (1.803 m)   Wt 277 lb 3.2 oz (125.7 kg)   SpO2 95%   BMI 38.66 kg/m    GEN: Well nourished, well  developed in no acute distress HEENT: Normal NECK: No JVD; No carotid bruits LYMPHATICS: No lymphadenopathy CARDIAC:RRR, no rubs, gallops 2/6 late peaking SM at RUSB RESPIRATORY:  Clear to auscultation without rales, wheezing or rhonchi  ABDOMEN: Soft, non-tender, non-distended MUSCULOSKELETAL:  No edema; No deformity  SKIN: Warm and dry NEUROLOGIC:  Alert and oriented x 3 PSYCHIATRIC:  Normal affect   Wt Readings from Last 3 Encounters:  10/16/22 277 lb 3.2 oz (125.7 kg)  08/21/22 277 lb 6.4 oz (125.8 kg)  07/23/22 272 lb (123.4 kg)      Studies/Labs Reviewed:  PAP compliance download  Recent Labs: 01/08/2022: Magnesium 2.2 04/09/2022: TSH 2.540 10/14/2022: BUN 22; Creatinine, Ser 0.90; Hemoglobin 15.7; Platelets 167; Potassium 4.4; Sodium 137    Additional studies/ records that were reviewed today include:  none    ASSESSMENT:    1. OSA (obstructive sleep apnea)   2. Essential hypertension       PLAN:  In order of problems listed above:  OSA - The patient is tolerating PAP therapy well without any problems. The PAP download performed by his DME was personally reviewed and interpreted by me today and showed an AHI of 6/hr on 12 cm H2O with 40 % compliance in using more than 4 hours nightly.  The patient has been using and benefiting from PAP use and will continue to benefit from therapy.  -Encouraged him to be more compliant with his device  Hypertension -BP is controlled on exam -Continue prescription drug managed with amlodipine 5 mg daily, hydralazine 10 mg 3 times daily, HCTZ 25 mg daily, olmesartan 20 mg daily with as needed refills  Medication Adjustments/Labs and Tests Ordered: Current medicines are reviewed at length with the patient today.   Concerns regarding medicines are outlined above.  Medication changes, Labs and Tests ordered today are listed in the Patient Instructions below.  There are no Patient Instructions on file for this visit.   Signed, Armanda Magic, MD  10/16/2022 1:55 PM    [facility] [address] [place], [place], [place]  [address] Phone: [phone]; Fax: [phone]

## 2022-10-19 ENCOUNTER — Other Ambulatory Visit: Payer: Self-pay

## 2022-10-19 ENCOUNTER — Encounter (HOSPITAL_COMMUNITY)
Admission: RE | Disposition: A | Discharge status: Home / Self Care | Payer: Self-pay | Source: Home / Self Care | Attending: Cardiovascular Disease

## 2022-10-19 ENCOUNTER — Ambulatory Visit (HOSPITAL_COMMUNITY)
Admission: RE | Admit: 2022-10-19 | Discharge: 2022-10-19 | Disposition: A | Discharge status: Home / Self Care | Payer: Medicare Other | Attending: Cardiovascular Disease | Admitting: Cardiovascular Disease

## 2022-10-19 ENCOUNTER — Ambulatory Visit: Payer: Medicare Other | Admitting: Adult Health

## 2022-10-19 DIAGNOSIS — I350 Nonrheumatic aortic (valve) stenosis: Secondary | ICD-10-CM

## 2022-10-19 DIAGNOSIS — E669 Obesity, unspecified: Secondary | ICD-10-CM | POA: Insufficient documentation

## 2022-10-19 DIAGNOSIS — I2510 Atherosclerotic heart disease of native coronary artery without angina pectoris: Secondary | ICD-10-CM | POA: Insufficient documentation

## 2022-10-19 DIAGNOSIS — Z8546 Personal history of malignant neoplasm of prostate: Secondary | ICD-10-CM | POA: Diagnosis not present

## 2022-10-19 DIAGNOSIS — Z7982 Long term (current) use of aspirin: Secondary | ICD-10-CM | POA: Diagnosis not present

## 2022-10-19 DIAGNOSIS — I10 Essential (primary) hypertension: Secondary | ICD-10-CM | POA: Insufficient documentation

## 2022-10-19 DIAGNOSIS — E785 Hyperlipidemia, unspecified: Secondary | ICD-10-CM | POA: Diagnosis not present

## 2022-10-19 DIAGNOSIS — Z6838 Body mass index (BMI) 38.0-38.9, adult: Secondary | ICD-10-CM | POA: Insufficient documentation

## 2022-10-19 HISTORY — PX: RIGHT/LEFT HEART CATH AND CORONARY ANGIOGRAPHY: CATH118266

## 2022-10-19 LAB — POCT I-STAT EG7
Acid-Base Excess: 1 mmol/L (ref 0.0–2.0)
Acid-base deficit: 2 mmol/L (ref 0.0–2.0)
Bicarbonate: 22.8 mmol/L (ref 20.0–28.0)
Bicarbonate: 26.9 mmol/L (ref 20.0–28.0)
Calcium, Ion: 1.03 mmol/L — ABNORMAL LOW (ref 1.15–1.40)
Calcium, Ion: 1.25 mmol/L (ref 1.15–1.40)
HCT: 39 % (ref 39.0–52.0)
HCT: 44 % (ref 39.0–52.0)
Hemoglobin: 13.3 g/dL (ref 13.0–17.0)
Hemoglobin: 15 g/dL (ref 13.0–17.0)
O2 Saturation: 75 %
O2 Saturation: 96 %
Potassium: 3.5 mmol/L (ref 3.5–5.1)
Potassium: 4 mmol/L (ref 3.5–5.1)
Sodium: 141 mmol/L (ref 135–145)
Sodium: 145 mmol/L (ref 135–145)
TCO2: 24 mmol/L (ref 22–32)
TCO2: 28 mmol/L (ref 22–32)
pCO2, Ven: 39.4 mmHg — ABNORMAL LOW (ref 44–60)
pCO2, Ven: 47 mmHg (ref 44–60)
pH, Ven: 7.365 (ref 7.25–7.43)
pH, Ven: 7.37 (ref 7.25–7.43)
pO2, Ven: 42 mmHg (ref 32–45)
pO2, Ven: 87 mmHg — ABNORMAL HIGH (ref 32–45)

## 2022-10-19 SURGERY — RIGHT/LEFT HEART CATH AND CORONARY ANGIOGRAPHY
Anesthesia: LOCAL

## 2022-10-19 MED ORDER — LIDOCAINE HCL (PF) 1 % IJ SOLN
INTRAMUSCULAR | Status: DC | PRN
  Administered 2022-10-19: 2 mL

## 2022-10-19 MED ORDER — LIDOCAINE HCL (PF) 1 % IJ SOLN
INTRAMUSCULAR | Status: AC
  Filled 2022-10-19: qty 30

## 2022-10-19 MED ORDER — SODIUM CHLORIDE 0.9% FLUSH: 3.0000 mL | INTRAVENOUS | Status: DC | PRN

## 2022-10-19 MED ORDER — MIDAZOLAM HCL 2 MG/2ML IJ SOLN
INTRAMUSCULAR | Status: DC | PRN
  Administered 2022-10-19: 1 mg via INTRAVENOUS

## 2022-10-19 MED ORDER — SODIUM CHLORIDE 0.9% FLUSH: 3.0000 mL | Freq: Two times a day (BID) | INTRAVENOUS | Status: DC

## 2022-10-19 MED ORDER — SODIUM CHLORIDE 0.9 % WEIGHT BASED INFUSION: 1.0000 mL/kg/h | INTRAVENOUS | Status: DC

## 2022-10-19 MED ORDER — VERAPAMIL HCL 2.5 MG/ML IV SOLN
INTRAVENOUS | Status: AC
  Filled 2022-10-19: qty 2

## 2022-10-19 MED ORDER — SODIUM CHLORIDE 0.9 % IV SOLN: 250.0000 mL | INTRAVENOUS | Status: DC | PRN

## 2022-10-19 MED ORDER — VERAPAMIL HCL 2.5 MG/ML IV SOLN
INTRAVENOUS | Status: DC | PRN
  Administered 2022-10-19: 10 mL via INTRA_ARTERIAL

## 2022-10-19 MED ORDER — SODIUM CHLORIDE 0.9 % WEIGHT BASED INFUSION
3.0000 mL/kg/h | INTRAVENOUS | Status: AC
  Administered 2022-10-19: 3 mL/kg/h via INTRAVENOUS

## 2022-10-19 MED ORDER — SODIUM CHLORIDE 0.9 % IV SOLN: INTRAVENOUS | Status: AC

## 2022-10-19 MED ORDER — HYDRALAZINE HCL 20 MG/ML IJ SOLN: 10.0000 mg | INTRAMUSCULAR | Status: DC | PRN

## 2022-10-19 MED ORDER — MIDAZOLAM HCL 2 MG/2ML IJ SOLN
INTRAMUSCULAR | Status: AC
  Filled 2022-10-19: qty 2

## 2022-10-19 MED ORDER — FENTANYL CITRATE (PF) 100 MCG/2ML IJ SOLN
INTRAMUSCULAR | Status: AC
  Filled 2022-10-19: qty 2

## 2022-10-19 MED ORDER — ACETAMINOPHEN 325 MG PO TABS: 650.0000 mg | ORAL_TABLET | ORAL | Status: DC | PRN

## 2022-10-19 MED ORDER — HEPARIN (PORCINE) IN NACL 1000-0.9 UT/500ML-% IV SOLN
INTRAVENOUS | Status: DC | PRN
  Administered 2022-10-19 (×2): 500 mL

## 2022-10-19 MED ORDER — LABETALOL HCL 5 MG/ML IV SOLN: 10.0000 mg | INTRAVENOUS | Status: DC | PRN

## 2022-10-19 MED ORDER — ONDANSETRON HCL 4 MG/2ML IJ SOLN: 4.0000 mg | Freq: Four times a day (QID) | INTRAMUSCULAR | Status: DC | PRN

## 2022-10-19 MED ORDER — ASPIRIN 81 MG PO CHEW: 81.0000 mg | CHEWABLE_TABLET | ORAL | Status: DC

## 2022-10-19 MED ORDER — HEPARIN SODIUM (PORCINE) 1000 UNIT/ML IJ SOLN
INTRAMUSCULAR | Status: DC | PRN
  Administered 2022-10-19: 6000 [IU] via INTRAVENOUS

## 2022-10-19 MED ORDER — FENTANYL CITRATE (PF) 100 MCG/2ML IJ SOLN
INTRAMUSCULAR | Status: DC | PRN
  Administered 2022-10-19: 25 ug via INTRAVENOUS

## 2022-10-19 SURGICAL SUPPLY — 15 items
BAND CMPR LRG ZPHR (HEMOSTASIS) ×1
BAND ZEPHYR COMPRESS 30 LONG (HEMOSTASIS) IMPLANT
CATH 5FR JL3.5 JR4 ANG PIG MP (CATHETERS) IMPLANT
CATH BALLN WEDGE 5F 110CM (CATHETERS) IMPLANT
CATH INFINITI JR4 5F (CATHETERS) IMPLANT
CATH LAUNCHER 5F EBU3.5 (CATHETERS) IMPLANT
DEVICE RAD COMP TR BAND LRG (VASCULAR PRODUCTS) IMPLANT
GLIDESHEATH SLEND SS 6F .021 (SHEATH) IMPLANT
GUIDEWIRE .025 260CM (WIRE) IMPLANT
GUIDEWIRE INQWIRE 1.5J.035X260 (WIRE) IMPLANT
INQWIRE 1.5J .035X260CM (WIRE) ×1
KIT SYRINGE INJ CVI SPIKEX1 (MISCELLANEOUS) IMPLANT
PACK CARDIAC CATHETERIZATION (CUSTOM PROCEDURE TRAY) ×2 IMPLANT
SET ATX-X65L (MISCELLANEOUS) IMPLANT
SHEATH GLIDE SLENDER 4/5FR (SHEATH) IMPLANT

## 2022-10-19 NOTE — Interval H&P Note (Signed)
History and Physical Interval Note:  10/19/2022 8:59 AM  Thomas [name]  has presented today for surgery, with the diagnosis of aortic stenosis.  The various methods of treatment have been discussed with the patient and family. After consideration of risks, benefits and other options for treatment, the patient has consented to  Procedure(s): RIGHT/LEFT HEART CATH AND CORONARY ANGIOGRAPHY (N/A) as a surgical intervention.  The patient's history has been reviewed, patient examined, no change in status, stable for surgery.  I have reviewed the patient's chart and labs.  Questions were answered to the patient's satisfaction.    Cath Lab Visit (complete for each Cath Lab visit)  Clinical Evaluation Leading to the Procedure:   ACS: No.  Non-ACS:    Anginal Classification: CCS II  Anti-ischemic medical therapy: Minimal Therapy (1 class of medications)  Non-Invasive Test Results: No non-invasive testing performed  Prior CABG: No previous CABG        Verne Carrow

## 2022-10-19 NOTE — Discharge Instructions (Signed)

## 2022-10-20 ENCOUNTER — Encounter (HOSPITAL_COMMUNITY): Payer: Self-pay | Admitting: Cardiovascular Disease

## 2022-10-21 ENCOUNTER — Other Ambulatory Visit (HOSPITAL_BASED_OUTPATIENT_CLINIC_OR_DEPARTMENT_OTHER): Payer: Self-pay | Admitting: *Deleted

## 2022-10-21 MED ORDER — AMLODIPINE BESYLATE 5 MG PO TABS: 5.0000 mg | ORAL_TABLET | Freq: Every day | ORAL | 3 refills | Status: DC

## 2022-10-27 ENCOUNTER — Ambulatory Visit (HOSPITAL_COMMUNITY)
Admission: RE | Admit: 2022-10-27 | Discharge: 2022-10-27 | Disposition: A | Discharge status: Home / Self Care | Payer: Medicare Other | Source: Ambulatory Visit | Attending: Internal Medicine | Admitting: Internal Medicine

## 2022-10-27 DIAGNOSIS — I350 Nonrheumatic aortic (valve) stenosis: Secondary | ICD-10-CM | POA: Diagnosis present

## 2022-10-27 MED ORDER — IOHEXOL 350 MG/ML SOLN
100.0000 mL | Freq: Once | INTRAVENOUS | Status: AC | PRN
  Administered 2022-10-27: 100 mL via INTRAVENOUS

## 2022-10-27 NOTE — Progress Notes (Unsigned)
Structural Heart Clinic Note  No chief complaint on file.  History of Present Illness: 67 yo male with history of anxiety, HTN, hyperlipidemia, prostate cancer, CAD and moderate to severe aortic stenosis who is here today for follow up. I saw him in February 2024 as a new consult for evaluation of his aortic stenosis. Echo 03/30/22 with LVEF=60-65%. Moderate LVH. Moderate to severe aortic stenosis with mean gradient 33.5 mmHg, AVA 0.88 cm2, DI 0.24, SVI 34. At our first visit in February 2024, he was feeling well with no symptoms so we elected to follow his AS. Repeat echo July 2024 with normal LV systolic function and progression of his aortic stenosis into the severe range with mean gradient 47 mmHg, peak gradient 80 mmHg, AVA 0.84 cm2, DI 0.22. He called our office and reported an episode of dizziness when standing in the heat holding a ladder for a friend. We elected to proceed with his TAVR workup. Cardiac cath 10/19/22 with mild non-obstructive CAD, normal right and left heart pressures. Mean gradient across the aortic valve was 28.5 mmHg by cath. Cardiac CT 10/27/22 with ***  He tells me today that he ***  He lives in [place] with his wife. 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 He has no active dental issues.   Primary Care Physician: Emilio Aspen, MD Primary Cardiologist: Bjorn Pippin Referring Cardiologist: Bjorn Pippin  Past Medical History:  Diagnosis Date   Anemia    hx of in 1992    Anxiety    Arthritis    lower back    Back pain    Blockage of coronary artery of heart (HCC)    Constipation    Generalized muscle weakness    GERD (gastroesophageal reflux disease)    rarely    Hearing loss    History of anxiety    Hyperlipemia    Hypertension    IBS (irritable bowel syndrome)    Joint pain    Lower urinary tract symptoms (LUTS)    Malignant neoplasm of prostate (HCC) 04/17/2014   Other fatigue    Pneumonia    hx x2  of pneumonia at age 78    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   Shortness of breath on exertion    Ventral hernia without obstruction or gangrene 03/27/2016   Vitamin D deficiency     Past Surgical History:  Procedure Laterality Date   Epididymis Excision Left    Spermocele   HEMORROIDECTOMY     HERNIA REPAIR Bilateral 1967 and 1986   x2 open inguinal hernia repairs   INCISION AND DRAINAGE OF WOUND  2017   Wound Infection Post OP   LYMPHADENECTOMY Bilateral 05/17/2014   Procedure: PELVIC LYMPHADENECTOMY;  Surgeon: Heloise Purpura, MD;  Location: [facility];  Service: Urology;  Laterality: Bilateral;   PROSTATE BIOPSY  03/22/14   Heloise Purpura   RIGHT/LEFT HEART CATH AND CORONARY ANGIOGRAPHY N/A 10/19/2022   Procedure: RIGHT/LEFT HEART CATH AND CORONARY ANGIOGRAPHY;  Surgeon: Kathleene Hazel, MD;  Location: [facility];  Service: Cardiovascular;  Laterality: N/A;   ROBOT ASSISTED LAPAROSCOPIC RADICAL PROSTATECTOMY N/A 05/17/2014   Procedure: ROBOTIC ASSISTED LAPAROSCOPIC RADICAL PROSTATECTOMY LEVEL 2;  Surgeon: Heloise Purpura, MD;  Location: [facility];  Service: Urology;  Laterality: N/A;   SKIN TAG REMOVAL     15 lesions   VASECTOMY     VENTRAL HERNIA REPAIR N/A 03/27/2016   Procedure: LAPAROSCOPIC  VENTRAL HERNIA;  Surgeon: Ovidio Kin, MD;  Location: [facility];  Service: General;  Laterality: N/A;  WITH MESH    Current Outpatient Medications  Medication Sig Dispense Refill   amLODipine (NORVASC) 5 MG tablet Take 1 tablet (5 mg total) by mouth daily. 90 tablet 3   aspirin EC 81 MG tablet Take 81 mg by mouth daily. Swallow whole.     Biotin 1000 MCG tablet Take 1,000 mcg by mouth daily.     Boswellia-Glucosamine-Vit D (GLUCOSAMINE COMPLEX -BOSWELLIA PO) Take 1,500 mg by mouth daily.     Cholecalciferol (VITAMIN D-1000 MAX ST) 25 MCG (1000 UT) tablet Take 1,000 Units by mouth daily.     Coenzyme Q10 (CO Q 10) 100 MG CAPS Take by mouth.     ezetimibe (ZETIA) 10 MG tablet  TAKE ONE TABLET BY MOUTH ONE TIME DAILY 90 tablet 1   fluticasone (FLONASE) 50 MCG/ACT nasal spray Place 1 spray into both nostrils at bedtime as needed for allergies or rhinitis.     Garlic 1000 MG CAPS Take 1,000 mg by mouth daily.     hydrALAZINE (APRESOLINE) 10 MG tablet Take 1 tablet (10 mg total) by mouth 3 (three) times daily. 270 tablet 3   hydrochlorothiazide (HYDRODIURIL) 25 MG tablet Take 1 tablet (25 mg total) by mouth daily. 90 tablet 3   Multiple Vitamins-Minerals (MULTIVITAMIN WITH MINERALS) tablet Take 1 tablet by mouth daily. Men     olmesartan (BENICAR) 20 MG tablet Take 1 tablet (20 mg total) by mouth daily. 90 tablet 2   Omega-3 Fatty Acids (OMEGA 3 PO) Take 1,500 mg by mouth daily.     rosuvastatin (CRESTOR) 40 MG tablet Take 1 tablet (40 mg total) by mouth daily. 90 tablet 3   vortioxetine HBr (TRINTELLIX) 20 MG TABS tablet Take 1 tablet (20 mg total) by mouth daily. 30 tablet 0   No current facility-administered medications for this visit.    Allergies  Allergen Reactions   Amoxicillin-Pot Clavulanate Other (See Comments) and Diarrhea   Topiramate Other (See Comments)    Social History   Socioeconomic History   Marital status: Married    Spouse name: [name]   Number of children: 2   Years of education: Not on file   Highest education level: Not on file  Occupational History   Occupation: Retired   Occupation: Retired from the car business but works part time at [employer]  Tobacco Use   Smoking status: Never   Smokeless tobacco: Never  Vaping Use   Vaping status: Never Used  Substance and Sexual Activity   Alcohol use: No    Alcohol/week: 0.0 standard drinks of alcohol   Drug use: No   Sexual activity: Yes    Partners: Male  Other Topics Concern   Not on file  Social History Narrative   Not on file   Social Determinants of Health   Financial Resource Strain: Not on file  Food Insecurity: Not on file  Transportation Needs: Not on  file  Physical Activity: Not on file  Stress: Not on file  Social Connections: Not on file  Intimate Partner Violence: Not on file    Family History  Problem Relation Age of Onset   Eating disorder Mother    Alcohol abuse Mother    Bipolar disorder Mother    Anxiety disorder Mother    Depression Mother    Kidney disease Mother    Heart disease Mother    Hyperlipidemia Mother  Hypertension Mother    Heart attack Mother    Cancer Mother    COPD Mother    Ovarian cancer Mother    Heart failure Mother    Drug abuse Father    Cancer Father    Heart attack Paternal Grandfather    Heart disease Other    Kidney disease Other    Depression Other    Anxiety disorder Other    Bipolar disorder Other    Alcohol abuse Other    Drug abuse Other    Eating disorder Other     Review of Systems:  As stated in the HPI and otherwise negative.   There were no vitals taken for this visit.  Physical Examination: General: Well developed, well nourished, NAD  HEENT: OP clear, mucus membranes moist  SKIN: warm, dry. No rashes. Neuro: No focal deficits  Musculoskeletal: Muscle strength 5/5 all ext  Psychiatric: Mood and affect normal  Neck: No JVD, no carotid bruits, no thyromegaly, no lymphadenopathy.  Lungs:Clear bilaterally, no wheezes, rhonci, crackles Cardiovascular: Regular rate and rhythm. *** Systolic murmur.  Abdomen:Soft. Bowel sounds present. Non-tender.  Extremities: No lower extremity edema. Pulses are 2 + in the bilateral DP/PT.  EKG:  EKG is not *** ordered today. The ekg ordered today demonstrates   Echo 10/01/22: 1. Left ventricular ejection fraction, by estimation, is 60 to 65%. The  left ventricle has normal function. The left ventricle has no regional  wall motion abnormalities. There is moderate concentric left ventricular  hypertrophy. Left ventricular  diastolic parameters are consistent with Grade I diastolic dysfunction  (impaired relaxation).   2. Right  ventricular systolic function is normal. The right ventricular  size is normal. There is normal pulmonary artery systolic pressure. The  estimated right ventricular systolic pressure is 28.6 mmHg.   3. Left atrial size was mild to moderately dilated.   4. The mitral valve is normal in structure. Trivial mitral valve  regurgitation. No evidence of mitral stenosis.   5. The aortic valve is tricuspid. There is severe calcifcation of the  aortic valve. Aortic valve regurgitation is not visualized. Severe aortic  valve stenosis. Aortic valve area, by VTI measures 0.84 cm. Aortic valve  mean gradient measures 47.0 mmHg.   6. The inferior vena cava is normal in size with greater than 50%  respiratory variability, suggesting right atrial pressure of 3 mmHg.   FINDINGS   Left Ventricle: Left ventricular ejection fraction, by estimation, is 60  to 65%. The left ventricle has normal function. The left ventricle has no  regional wall motion abnormalities. The left ventricular internal cavity  size was normal in size. There is   moderate concentric left ventricular hypertrophy. Left ventricular  diastolic parameters are consistent with Grade I diastolic dysfunction  (impaired relaxation).   Right Ventricle: The right ventricular size is normal. No increase in  right ventricular wall thickness. Right ventricular systolic function is  normal. There is normal pulmonary artery systolic pressure. The tricuspid  regurgitant velocity is 2.53 m/s, and   with an assumed right atrial pressure of 3 mmHg, the estimated right  ventricular systolic pressure is 28.6 mmHg.   Left Atrium: Left atrial size was mild to moderately dilated.   Right Atrium: Right atrial size was normal in size.   Pericardium: There is no evidence of pericardial effusion.   Mitral Valve: The mitral valve is normal in structure. Mild mitral annular  calcification. Trivial mitral valve regurgitation. No evidence of mitral  valve  stenosis.   Tricuspid Valve: The tricuspid valve is normal in structure. Tricuspid  valve regurgitation is trivial.   Aortic Valve: The aortic valve is tricuspid. There is severe calcifcation  of the aortic valve. Aortic valve regurgitation is not visualized. Severe  aortic stenosis is present. Aortic valve mean gradient measures 47.0 mmHg.  Aortic valve peak gradient  measures 80.4 mmHg. Aortic valve area, by VTI measures 0.84 cm.   Pulmonic Valve: The pulmonic valve was normal in structure. Pulmonic valve  regurgitation is trivial.   Aorta: The aortic root is normal in size and structure.   Venous: The inferior vena cava is normal in size with greater than 50%  respiratory variability, suggesting right atrial pressure of 3 mmHg.   IAS/Shunts: No atrial level shunt detected by color flow Doppler.     LEFT VENTRICLE  PLAX 2D  LVIDd:         5.10 cm   Diastology  LVIDs:         3.00 cm   LV e' medial:    9.03 cm/s  LV PW:         1.70 cm   LV E/e' medial:  9.7  LV IVS:        1.60 cm   LV e' lateral:   16.20 cm/s  LVOT diam:     2.20 cm   LV E/e' lateral: 5.4  LV SV:         88  LV SV Index:   36  LVOT Area:     3.80 cm     RIGHT VENTRICLE             IVC  RV S prime:     17.20 cm/s  IVC diam: 1.80 cm  TAPSE (M-mode): 2.1 cm  RVSP:           28.6 mmHg   LEFT ATRIUM            Index        RIGHT ATRIUM           Index  LA diam:      5.00 cm  2.06 cm/m   RA Pressure: 3.00 mmHg  LA Vol (A2C): 80.9 ml  33.39 ml/m  RA Area:     17.10 cm  LA Vol (A4C): 108.0 ml 44.57 ml/m  RA Volume:   43.00 ml  17.75 ml/m   AORTIC VALVE  AV Area (Vmax):    0.86 cm  AV Area (Vmean):   0.86 cm  AV Area (VTI):     0.84 cm  AV Vmax:           448.33 cm/s  AV Vmean:          315.333 cm/s  AV VTI:            1.040 m  AV Peak Grad:      80.4 mmHg  AV Mean Grad:      47.0 mmHg  LVOT Vmax:         102.00 cm/s  LVOT Vmean:        71.100 cm/s  LVOT VTI:          0.231 m  LVOT/AV VTI  ratio: 0.22    AORTA  Ao Root diam: 3.20 cm  Ao Asc diam:  3.40 cm   MITRAL VALVE                TRICUSPID VALVE  MV Area (PHT): 2.74 cm  TR Peak grad:   25.6 mmHg  MV Decel Time: 277 msec     TR Vmax:        253.00 cm/s  MV E velocity: 88.00 cm/s   Estimated RAP:  3.00 mmHg  MV A velocity: 101.00 cm/s  RVSP:           28.6 mmHg  MV E/A ratio:  0.87                              SHUNTS                              Systemic VTI:  0.23 m                              Systemic Diam: 2.20 cm   Cardiac cath 10/19/22:    Mid LAD lesion is 20% stenosed.   Mild non-obstructive CAD Normal right and left heart pressures (RA 5, RV 30/5/9, PA 32/14 mean 22, PCWP 8) Severe aortic stenosis by echo (mean gradient by cath is 28.5 mmHg).    Recommendations: Continue planning for SAVR vs TAVR. Medical management of mild CAD   Indications  Severe aortic stenosis [I35.0 (ICD-10-CM)]   Procedural Details  Technical Details Indication: 67 yo male with severe aortic stenosis  Procedure: The risks, benefits, complications, treatment options, and expected outcomes were discussed with the patient. The patient and/or family concurred with the proposed plan, giving informed consent. The patient was sedated with Versed and Fentanyl. The IV catheter present in the right antecubital vein was changed for a 5 [place] sheath. Right heart catheterization performed with a balloon tipped catheter. The right wrist was prepped and draped in a sterile fashion. 1% lidocaine was used for local anesthesia. Using the modified Seldinger access technique, a 5 French sheath was placed in the right radial artery. 3 mg Verapamil was given through the sheath. Weight based IV heparin was given. Standard diagnostic catheters were used to perform selective coronary angiography. I engaged the left main with a 5 Fr EBU 3.5 guiding catheter. LV pressures measured with the JR4 catheter. All catheter exchanges were performed over an  exchange length guidewire.   The sheath was removed from the right radial artery and a hemostasis band was applied at the arteriotomy site on the right wrist.      Estimated blood loss <50 mL.   During this procedure medications were administered to achieve and maintain moderate conscious sedation while the patient's heart rate, blood pressure, and oxygen saturation were continuously monitored and I was present face-to-face 100% of this time.   Medications (Filter: Administrations occurring from 1005 to 1118 on 10/19/22) fentaNYL (SUBLIMAZE) injection (mcg)  Total dose: 25 mcg Date/Time Rate/Dose/Volume Action   10/19/22 1029 25 mcg Given   midazolam (VERSED) injection (mg)  Total dose: 1 mg Date/Time Rate/Dose/Volume Action   10/19/22 1029 1 mg Given   Heparin (Porcine) in NaCl 1000-0.9 UT/500ML-% SOLN (mL)  Total volume: 1,000 mL Date/Time Rate/Dose/Volume Action   10/19/22 1029 500 mL Given   1029 500 mL Given   lidocaine (PF) (XYLOCAINE) 1 % injection (mL)  Total volume: 2 mL Date/Time Rate/Dose/Volume Action   10/19/22 1037 2 mL Given   heparin sodium (porcine) injection (Units)  Total dose: 6,000 Units Date/Time Rate/Dose/Volume Action   10/19/22 1051 6,000  Units Given   Radial Cocktail/Verapamil only (mL)  Total volume: 10 mL Date/Time Rate/Dose/Volume Action   10/19/22 1039 10 mL Given    Sedation Time  Sedation Time Physician-1: 33 minutes 57 seconds Radiation/Fluoro  Fluoro time: 6.5 (min) DAP: 40532 (mGycm2) Cumulative Air Kerma: 590 (mGy) Complications  Complications documented before study signed (10/19/2022 11:24 AM)   No complications were associated with this study.  Documented by Loma Messing B - 10/19/2022 11:13 AM     Coronary Findings  Diagnostic Dominance: Left Left Anterior Descending  Vessel is large.  Mid LAD lesion is 20% stenosed.    Left Circumflex  Vessel is large.    Right Coronary Artery  Vessel is moderate in size.     Intervention   No interventions have been documented.   Coronary Diagrams  Diagnostic Dominance: Left  Intervention   Implants   No implant documentation for this case.   Syngo Images   Show images for CARDIAC CATHETERIZATION Images on Long Term Storage   Show images for Jerzy, Obrochta to Procedure Log  Procedure Log   Link to Procedure Log  Procedure Log    Hemo Data  Flowsheet Row Most Recent Value  Fick Cardiac Output 7.19 L/min  Fick Cardiac Output Index 2.97 (L/min)/BSA  Aortic Mean Gradient 28.54 mmHg  Aortic Peak Gradient 27.6 mmHg  Aortic Valve Area 1.56  Aortic Value Area Index 0.64 cm2/BSA  RA A Wave 7 mmHg  RA V Wave 6 mmHg  RA Mean 5 mmHg  RV Systolic Pressure 30 mmHg  RV Diastolic Pressure 5 mmHg  RV EDP 9 mmHg  PA Systolic Pressure 32 mmHg  PA Diastolic Pressure 14 mmHg  PA Mean 22 mmHg  PW A Wave 16 mmHg  PW V Wave 14 mmHg  PW Mean 8 mmHg  AO Systolic Pressure 128 mmHg  AO Diastolic Pressure 65 mmHg  AO Mean 89 mmHg  LV Systolic Pressure 164 mmHg  LV Diastolic Pressure -2 mmHg  LV EDP 9 mmHg  AOp Systolic Pressure 122 mmHg  AOp Diastolic Pressure 60 mmHg  AOp Mean Pressure 85 mmHg  LVp Systolic Pressure 147 mmHg  LVp Diastolic Pressure -10 mmHg  LVp EDP Pressure 8 mmHg  QP/QS 1  TPVR Index 7.42 HRUI  TSVR Index 30.01 HRUI  PVR SVR Ratio 0.17  TPVR/TSVR Ratio 0.25    Recent Labs: 01/08/2022: Magnesium 2.2 04/09/2022: TSH 2.540 10/14/2022: BUN 22; Creatinine, Ser 0.90; Platelets 167 10/19/2022: Hemoglobin 15.0; Potassium 4.0; Sodium 141   Wt Readings from Last 3 Encounters:  10/19/22 125.6 kg  10/16/22 125.7 kg  08/21/22 125.8 kg    Assessment and Plan:   1. Severe Aortic Valve Stenosis: He has severe aortic stenosis by echo. He is asymptomatic. His episode of dizziness was when he was overheated outside. No recurrent dizziness. He has NYHA class *** symptoms. Since he is asymptomatic, we will continue to monitor for  now. He would likely be a candidate for surgical AVR or TAVR.    I have reviewed the natural history of aortic stenosis with the patient and their family members  who are present today. We have discussed the limitations of medical therapy and the poor prognosis associated with symptomatic aortic stenosis. We have reviewed potential treatment options, including palliative medical therapy, conventional surgical aortic valve replacement, and transcatheter aortic valve replacement. We discussed treatment options in the context of the patient's specific comorbid medical conditions.    Will repeat his echo in 6  months.     Labs/ tests ordered today include:  No orders of the defined types were placed in this encounter.  Disposition:   F/U with me in 6 months  Signed, Verne Carrow, MD, [facility] 10/27/2022 12:48 PM    [facility] [address] [place], [place], [place]  [address] Phone: [phone]; Fax: [phone]

## 2022-10-28 ENCOUNTER — Ambulatory Visit: Payer: Medicare Other | Attending: Cardiovascular Disease | Admitting: Cardiovascular Disease

## 2022-10-28 ENCOUNTER — Encounter: Payer: Self-pay | Admitting: Cardiovascular Disease

## 2022-10-28 VITALS — BP 118/70 | HR 62 | Ht 71.0 in | Wt 280.0 lb

## 2022-10-28 DIAGNOSIS — I350 Nonrheumatic aortic (valve) stenosis: Secondary | ICD-10-CM | POA: Diagnosis present

## 2022-10-28 NOTE — Patient Instructions (Signed)
Medication Instructions:  No changes *If you need a refill on your cardiac medications before your next appointment, please call your pharmacy*   Lab Work: none   Testing/Procedures: Your physician has requested that you have an echocardiogram. Echocardiography is a painless test that uses sound waves to create images of your heart. It provides your doctor with information about the size and shape of your heart and how well your heart's chambers and valves are working. This procedure takes approximately one hour. There are no restrictions for this procedure. Please do NOT wear cologne, perfume, aftershave, or lotions (deodorant is allowed). Please arrive 15 minutes prior to your appointment time.   Follow-Up: At [facility], you and your health needs are our priority.  As part of our continuing mission to provide you with exceptional heart care, we have created designated Provider Care Teams.  These Care Teams include your primary Cardiologist (physician) and Advanced Practice Providers (APPs -  Physician Assistants and Nurse Practitioners) who all work together to provide you with the care you need, when you need it.   Your next appointment:   6 month(s) (after next echo completed)  Provider:   Verne Carrow, MD

## 2022-11-09 ENCOUNTER — Ambulatory Visit: Payer: Medicare Other | Admitting: Cardiovascular Disease

## 2022-12-22 ENCOUNTER — Other Ambulatory Visit: Payer: Self-pay | Admitting: Neurosurgery

## 2022-12-22 DIAGNOSIS — M5440 Lumbago with sciatica, unspecified side: Secondary | ICD-10-CM

## 2022-12-24 ENCOUNTER — Ambulatory Visit
Admission: RE | Admit: 2022-12-24 | Discharge: 2022-12-24 | Disposition: A | Discharge status: Home / Self Care | Payer: Medicare Other | Source: Ambulatory Visit | Attending: Neurosurgery | Admitting: Neurosurgery

## 2022-12-24 DIAGNOSIS — M5440 Lumbago with sciatica, unspecified side: Secondary | ICD-10-CM

## 2023-01-11 ENCOUNTER — Other Ambulatory Visit: Payer: Self-pay

## 2023-01-11 MED ORDER — OLMESARTAN MEDOXOMIL 20 MG PO TABS: 20.0000 mg | ORAL_TABLET | Freq: Every day | ORAL | 3 refills | Status: DC

## 2023-01-22 ENCOUNTER — Encounter: Payer: Self-pay | Admitting: Cardiology

## 2023-01-22 ENCOUNTER — Ambulatory Visit: Payer: Medicare Other | Attending: Cardiology | Admitting: Cardiology

## 2023-01-22 VITALS — BP 130/80 | HR 67 | Ht 71.0 in | Wt 274.0 lb

## 2023-01-22 DIAGNOSIS — I10 Essential (primary) hypertension: Secondary | ICD-10-CM | POA: Insufficient documentation

## 2023-01-22 DIAGNOSIS — I2510 Atherosclerotic heart disease of native coronary artery without angina pectoris: Secondary | ICD-10-CM | POA: Insufficient documentation

## 2023-01-22 DIAGNOSIS — I350 Nonrheumatic aortic (valve) stenosis: Secondary | ICD-10-CM | POA: Diagnosis present

## 2023-01-22 DIAGNOSIS — E785 Hyperlipidemia, unspecified: Secondary | ICD-10-CM | POA: Diagnosis present

## 2023-01-22 NOTE — Patient Instructions (Signed)
Medication Instructions:  Continue all current medications *If you need a refill on your cardiac medications before your next appointment, please call your pharmacy*   Lab Work: NONE   Testing/Procedures: none   Follow-Up: At [facility], you and your health needs are our priority.  As part of our continuing mission to provide you with exceptional heart care, we have created designated Provider Care Teams.  These Care Teams include your primary Cardiologist (physician) and Advanced Practice Providers (APPs -  Physician Assistants and Nurse Practitioners) who all work together to provide you with the care you need, when you need it.   Your next appointment:   6 month(s)  Provider:   Little Ishikawa, MD

## 2023-01-22 NOTE — Progress Notes (Signed)
Cardiology Office Note:    Date:  01/22/2023   ID:  Thomas [name], DOB 1955-04-09, MRN [id number]  PCP:  Thomas Aspen, MD  Cardiologist:  Thomas Ishikawa, MD  Electrophysiologist:  None   Referring MD: Thomas [name], *   Chief Complaint  Patient presents with   Coronary Artery Disease    History of Present Illness:    Thomas [name] is a 67 y.o. male with a hx of CAD, hypertension, hyperlipidemia, aortic stenosis, obesity who presents for follow-up.  Echocardiogram 01/27/2019 showed EF 55 to 60%, moderate LVH, grade 1 diastolic dysfunction, normal RV function, mild to moderate aortic stenosis.  Coronary CTA on 02/23/2019 showed nonobstructive CAD, calcium score 113 (87th percentile).  Zio patch x6 days on 06/12/2020 showed 1 episode of NSVT lasting 4 beats, frequent PVCs (6.3% of beats).  Echocardiogram 05/28/2021 showed EF 65 to 70%, moderate LVH, grade 1 diastolic dysfunction, normal RV function, severe left atrial enlargement, moderate aortic stenosis.  Echocardiogram 03/30/2022 showed EF 60 to 65%, grade 1 diastolic dysfunction, normal RV function, moderate to severe aortic stenosis.  Zio patch x 7 days 05/2022 showed frequent PVCs (7.7% of beats).  Echo 09/2022 showed normal biventricular function, severe aortic stenosis (mean gradient 47 mmHg, AVA 0.8 cm).  LHC/RHC 10/2022 showed 20% mid LAD stenosis, normal filling pressures.  Since last clinic visit, he reports he is doing well.  Denies any chest pain, dyspnea, lightheadedness, syncope, lower extremity edema, or palpitations.  Reports having back pain, he was found to have spinal stenosis, starting physical therapy.  Reports he is active at work at [employer], climbed ladder 25 times yesterday, denied any exertional symptoms.  BP Readings from Last 3 Encounters:  01/22/23 130/80  10/28/22 118/70  10/27/22 132/68    Wt Readings from Last 3 Encounters:  01/22/23 274 lb (124.3 kg)  10/28/22 280 lb (127 kg)   10/19/22 277 lb (125.6 kg)     Past Medical History:  Diagnosis Date   Anemia    hx of in 1992    Anxiety    Arthritis    lower back    Back pain    Blockage of coronary artery of heart (HCC)    Constipation    Generalized muscle weakness    GERD (gastroesophageal reflux disease)    rarely    Hearing loss    History of anxiety    Hyperlipemia    Hypertension    IBS (irritable bowel syndrome)    Joint pain    Lower urinary tract symptoms (LUTS)    Malignant neoplasm of prostate (HCC) 04/17/2014   Other fatigue    Pneumonia    hx x2 of pneumonia at age 4    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   Shortness of breath on exertion    Ventral hernia without obstruction or gangrene 03/27/2016   Vitamin D deficiency     Past Surgical History:  Procedure Laterality Date   Epididymis Excision Left    Spermocele   HEMORROIDECTOMY     HERNIA REPAIR Bilateral 1967 and 1986   x2 open inguinal hernia repairs   INCISION AND DRAINAGE OF WOUND  2017   Wound Infection Post OP   LYMPHADENECTOMY Bilateral 05/17/2014   Procedure: PELVIC LYMPHADENECTOMY;  Surgeon: Heloise Purpura, MD;  Location: [facility];  Service: Urology;  Laterality: Bilateral;   PROSTATE BIOPSY  03/22/14   Heloise Purpura   RIGHT/LEFT HEART CATH AND CORONARY ANGIOGRAPHY N/A 10/19/2022  Procedure: RIGHT/LEFT HEART CATH AND CORONARY ANGIOGRAPHY;  Surgeon: Kathleene Hazel, MD;  Location: [facility];  Service: Cardiovascular;  Laterality: N/A;   ROBOT ASSISTED LAPAROSCOPIC RADICAL PROSTATECTOMY N/A 05/17/2014   Procedure: ROBOTIC ASSISTED LAPAROSCOPIC RADICAL PROSTATECTOMY LEVEL 2;  Surgeon: Heloise Purpura, MD;  Location: [facility];  Service: Urology;  Laterality: N/A;   SKIN TAG REMOVAL     15 lesions   VASECTOMY     VENTRAL HERNIA REPAIR N/A 03/27/2016   Procedure: LAPAROSCOPIC VENTRAL HERNIA;  Surgeon: Ovidio Kin, MD;  Location: [facility];  Service: General;  Laterality: N/A;  WITH MESH    Current  Medications: Current Meds  Medication Sig   amLODipine (NORVASC) 5 MG tablet Take 1 tablet (5 mg total) by mouth daily.   aspirin EC 81 MG tablet Take 81 mg by mouth daily. Swallow whole.   Biotin 1000 MCG tablet Take 1,000 mcg by mouth daily.   Boswellia-Glucosamine-Vit D (GLUCOSAMINE COMPLEX -BOSWELLIA PO) Take 1,500 mg by mouth daily.   Cholecalciferol (VITAMIN D-1000 MAX ST) 25 MCG (1000 UT) tablet Take 1,000 Units by mouth daily.   Coenzyme Q10 (CO Q 10) 100 MG CAPS Take by mouth.   ezetimibe (ZETIA) 10 MG tablet TAKE ONE TABLET BY MOUTH ONE TIME DAILY   fluticasone (FLONASE) 50 MCG/ACT nasal spray Place 1 spray into both nostrils at bedtime as needed for allergies or rhinitis.   Garlic 1000 MG CAPS Take 1,000 mg by mouth daily.   hydrALAZINE (APRESOLINE) 10 MG tablet Take 1 tablet (10 mg total) by mouth 3 (three) times daily.   Multiple Vitamins-Minerals (MULTIVITAMIN WITH MINERALS) tablet Take 1 tablet by mouth daily. Men   olmesartan (BENICAR) 20 MG tablet Take 1 tablet (20 mg total) by mouth daily.   Omega-3 Fatty Acids (OMEGA 3 PO) Take 1,500 mg by mouth daily.   rosuvastatin (CRESTOR) 40 MG tablet Take 1 tablet (40 mg total) by mouth daily.   vortioxetine HBr (TRINTELLIX) 20 MG TABS tablet Take 1 tablet (20 mg total) by mouth daily.     Allergies:   Amoxicillin-pot clavulanate and Topiramate   Social History   Socioeconomic History   Marital status: Married    Spouse name: [name]   Number of children: 2   Years of education: Not on file   Highest education level: Not on file  Occupational History   Occupation: Retired   Occupation: Retired from the car business but works part time at [employer]  Tobacco Use   Smoking status: Never   Smokeless tobacco: Never  Vaping Use   Vaping status: Never Used  Substance and Sexual Activity   Alcohol use: No    Alcohol/week: 0.0 standard drinks of alcohol   Drug use: No   Sexual activity: Yes    Partners: Male   Other Topics Concern   Not on file  Social History Narrative   Not on file   Social Determinants of Health   Financial Resource Strain: Not on file  Food Insecurity: Not on file  Transportation Needs: Not on file  Physical Activity: Not on file  Stress: Not on file  Social Connections: Not on file     Family History: The patient's family history includes Alcohol abuse in his mother and another family member; Anxiety disorder in his mother and another family member; Bipolar disorder in his mother and another family member; COPD in his mother; Cancer in his father and mother; Depression in his mother and another family  member; Drug abuse in his father and another family member; Eating disorder in his mother and another family member; Heart attack in his mother and paternal grandfather; Heart disease in his mother and another family member; Heart failure in his mother; Hyperlipidemia in his mother; Hypertension in his mother; Kidney disease in his mother and another family member; Ovarian cancer in his mother.  ROS:   Please see the history of present illness.     All other systems reviewed and are negative.  EKGs/Labs/Other Studies Reviewed:    The following studies were reviewed today:   EKG:   01/08/22: Sinus rhythm, PVC, rate 64, no ST abnormalities  Recent Labs: 04/09/2022: TSH 2.540 10/14/2022: BUN 22; Creatinine, Ser 0.90; Platelets 167 10/19/2022: Hemoglobin 15.0; Potassium 4.0; Sodium 141  Recent Lipid Panel    Component Value Date/Time   CHOL 105 01/08/2022 1034   TRIG 83 01/08/2022 1034   HDL 40 01/08/2022 1034   CHOLHDL 2.6 01/08/2022 1034   LDLCALC 48 01/08/2022 1034    Physical Exam:    VS:  BP 130/80 (BP Location: Left Arm, Patient Position: Sitting, Cuff Size: Large)   Pulse 67   Ht 5\' 11"  (1.803 m)   Wt 274 lb (124.3 kg)   SpO2 98%   BMI 38.22 kg/m     Wt Readings from Last 3 Encounters:  01/22/23 274 lb (124.3 kg)  10/28/22 280 lb (127 kg)   10/19/22 277 lb (125.6 kg)     GEN:  Well nourished, well developed in no acute distress HEENT: Normal NECK: No JVD; No carotid bruits LYMPHATICS: No lymphadenopathy CARDIAC: RRR, 3/6 systolic murmur RESPIRATORY:  Clear to auscultation without rales, wheezing or rhonchi  ABDOMEN: Soft, non-tender, non-distended MUSCULOSKELETAL:  No edema; No deformity  SKIN: Warm and dry NEUROLOGIC:  Alert and oriented x 3 PSYCHIATRIC:  Normal affect   ASSESSMENT:    1. Aortic valve stenosis, etiology of cardiac valve disease unspecified   2. CAD in native artery   3. Essential hypertension   4. Hyperlipidemia, unspecified hyperlipidemia type     PLAN:    Aortic stenosis:  Echo 09/2022 showed normal biventricular function, severe aortic stenosis (mean gradient 47 mmHg, AVA 0.8 cm).  LHC/RHC 10/2022 showed 20% mid LAD stenosis, normal filling pressures. -He is followed by valve clinic, currently appears asymptomatic.  He is very active, appears asymptomatic. Asked him to call and let us know if he develops any chest pain, dyspnea, or lightheadedness/syncope.  Repeat echocardiogram planned for 04/2023  CAD: Coronary CTA on 02/23/2019 showed nonobstructive CAD, calcium score 113 (87th percentile).  Echocardiogram 05/28/2021 showed EF 65 to 70%, moderate LVH, grade 1 diastolic dysfunction, normal RV function, severe left atrial enlargement, moderate aortic stenosis. -Continue rosuvastatin and Zetia  PVCs: Zio patch x 7 days 05/2022 showed frequent PVCs (7.7% of beats).  Has had low resting heart rate, not on AV nodal block.  Continue to monitor  Hypertension: On valsartan-HCTZ 320-25 mg daily.  Appears controlled  Hyperlipidemia: On rosuvastatin 20 mg daily and Zetia 10 mg daily.  LDL 47 on 12/10/22  OSA: severe on sleep study 01/2022, started CPAP.  Reports compliance   RTC in 6 months   Medication Adjustments/Labs and Tests Ordered: Current medicines are reviewed at length with the patient  today.  Concerns regarding medicines are outlined above.  No orders of the defined types were placed in this encounter.  No orders of the defined types were placed in this encounter.   Patient Instructions  Medication Instructions:  Continue all current medications *If you need a refill on your cardiac medications before your next appointment, please call your pharmacy*   Lab Work: NONE   Testing/Procedures: none   Follow-Up: At [facility], you and your health needs are our priority.  As part of our continuing mission to provide you with exceptional heart care, we have created designated Provider Care Teams.  These Care Teams include your primary Cardiologist (physician) and Advanced Practice Providers (APPs -  Physician Assistants and Nurse Practitioners) who all work together to provide you with the care you need, when you need it.   Your next appointment:   6 month(s)  Provider:   Little Ishikawa, MD        Signed, Thomas Ishikawa, MD  01/22/2023 12:33 PM    [facility]

## 2023-03-01 ENCOUNTER — Telehealth: Payer: Self-pay | Admitting: Cardiology

## 2023-03-01 NOTE — Telephone Encounter (Signed)
Pt states he is experiencing more SOB with exertion. He states even with just walking down driveway. He states it has been getting worse and he isn't sure what to do. Please advise.

## 2023-03-01 NOTE — Telephone Encounter (Signed)
Called and spoke to patient who is calling about SOB for about 2 weeks. He stated he is not SOB all the time, only when doing certain activities. Patient deny CP, dizziness, lightheadedness or any other symptoms at this time. Scheduled patient for office visit tomorrow with Dr Bjorn Pippin at 11 am. Advised patient if symptoms worsen or new symptoms develop to go to the ED. Patient verbalized understanding and agree.

## 2023-03-02 ENCOUNTER — Ambulatory Visit: Payer: Medicare Other | Attending: Cardiology | Admitting: Cardiology

## 2023-03-02 ENCOUNTER — Encounter: Payer: Self-pay | Admitting: Cardiology

## 2023-03-02 VITALS — BP 152/90 | HR 60 | Ht 71.0 in | Wt 277.0 lb

## 2023-03-02 DIAGNOSIS — I2510 Atherosclerotic heart disease of native coronary artery without angina pectoris: Secondary | ICD-10-CM | POA: Diagnosis present

## 2023-03-02 DIAGNOSIS — E785 Hyperlipidemia, unspecified: Secondary | ICD-10-CM

## 2023-03-02 DIAGNOSIS — I10 Essential (primary) hypertension: Secondary | ICD-10-CM | POA: Diagnosis not present

## 2023-03-02 DIAGNOSIS — I350 Nonrheumatic aortic (valve) stenosis: Secondary | ICD-10-CM

## 2023-03-02 NOTE — Patient Instructions (Addendum)
Medication Instructions:  Continue current medications *If you need a refill on your cardiac medications before your next appointment, please call your pharmacy*   Lab Work: none If you have labs (blood work) drawn today and your tests are completely normal, you will receive your results only by: MyChart Message (if you have MyChart) OR A paper copy in the mail If you have any lab test that is abnormal or we need to change your treatment, we will call you to review the results.   Testing/Procedures: none   Follow-Up: At [facility], you and your health needs are our priority.  As part of our continuing mission to provide you with exceptional heart care, we have created designated Provider Care Teams.  These Care Teams include your primary Cardiologist (physician) and Advanced Practice Providers (APPs -  Physician Assistants and Nurse Practitioners) who all work together to provide you with the care you need, when you need it.  We recommend signing up for the patient portal called "MyChart".  Sign up information is provided on this After Visit Summary.  MyChart is used to connect with patients for Virtual Visits (Telemedicine).  Patients are able to view lab/test results, encounter notes, upcoming appointments, etc.  Non-urgent messages can be sent to your provider as well.   To learn more about what you can do with MyChart, go to [email or web address].    Your next appointment:   4 month(s)  Provider:   Little Ishikawa, MD     Other Instructions Amb referral to Cardiac thoracic Surgery  Please check blood pressure twice a day and send via my chart or call office

## 2023-03-02 NOTE — Progress Notes (Signed)
Cardiology Office Note:    Date:  03/02/2023   ID:  [name], DOB 04-Mar-1956, MRN [id number]  PCP:  Thomas Aspen, MD  Cardiologist:  Thomas Ishikawa, MD  Electrophysiologist:  None   Referring MD: Thomas [name], *   Chief Complaint  Patient presents with   Cardiac Valve Problem    History of Present Illness:    Thomas [name] is a 67 y.o. male with a hx of CAD, hypertension, hyperlipidemia, aortic stenosis, obesity who presents for follow-up.  Echocardiogram 01/27/2019 showed EF 55 to 60%, moderate LVH, grade 1 diastolic dysfunction, normal RV function, mild to moderate aortic stenosis.  Coronary CTA on 02/23/2019 showed nonobstructive CAD, calcium score 113 (87th percentile).  Zio patch x6 days on 06/12/2020 showed 1 episode of NSVT lasting 4 beats, frequent PVCs (6.3% of beats).  Echocardiogram 05/28/2021 showed EF 65 to 70%, moderate LVH, grade 1 diastolic dysfunction, normal RV function, severe left atrial enlargement, moderate aortic stenosis.  Echocardiogram 03/30/2022 showed EF 60 to 65%, grade 1 diastolic dysfunction, normal RV function, moderate to severe aortic stenosis.  Zio patch x 7 days 05/2022 showed frequent PVCs (7.7% of beats).  Echo 09/2022 showed normal biventricular function, severe aortic stenosis (mean gradient 47 mmHg, AVA 0.8 cm).  LHC/RHC 10/2022 showed 20% mid LAD stenosis, normal filling pressures.  Since last clinic visit, he reports has been having dyspnea on exertion.  States that yesterday he was trying to carry some pumpkins while walking and began to feel very short of breath.  Denies any chest pain or lightheadedness.  He has been under a lot of stress, he is taking care of his wife who had a meningioma removed and developed scalp wound infection.  BP Readings from Last 3 Encounters:  03/02/23 (!) 152/90  01/22/23 130/80  10/28/22 118/70    Wt Readings from Last 3 Encounters:  03/02/23 277 lb (125.6 kg)  01/22/23 274 lb  (124.3 kg)  10/28/22 280 lb (127 kg)     Past Medical History:  Diagnosis Date   Anemia    hx of in 1992    Anxiety    Arthritis    lower back    Back pain    Blockage of coronary artery of heart (HCC)    Constipation    Generalized muscle weakness    GERD (gastroesophageal reflux disease)    rarely    Hearing loss    History of anxiety    Hyperlipemia    Hypertension    IBS (irritable bowel syndrome)    Joint pain    Lower urinary tract symptoms (LUTS)    Malignant neoplasm of prostate (HCC) 04/17/2014   Other fatigue    Pneumonia    hx x2 of pneumonia at age 59    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   Shortness of breath on exertion    Ventral hernia without obstruction or gangrene 03/27/2016   Vitamin D deficiency     Past Surgical History:  Procedure Laterality Date   Epididymis Excision Left    Spermocele   HEMORROIDECTOMY     HERNIA REPAIR Bilateral 1967 and 1986   x2 open inguinal hernia repairs   INCISION AND DRAINAGE OF WOUND  2017   Wound Infection Post OP   LYMPHADENECTOMY Bilateral 05/17/2014   Procedure: PELVIC LYMPHADENECTOMY;  Surgeon: Heloise Purpura, MD;  Location: [facility];  Service: Urology;  Laterality: Bilateral;   PROSTATE BIOPSY  03/22/14   Heloise Purpura  RIGHT/LEFT HEART CATH AND CORONARY ANGIOGRAPHY N/A 10/19/2022   Procedure: RIGHT/LEFT HEART CATH AND CORONARY ANGIOGRAPHY;  Surgeon: Kathleene Hazel, MD;  Location: [facility];  Service: Cardiovascular;  Laterality: N/A;   ROBOT ASSISTED LAPAROSCOPIC RADICAL PROSTATECTOMY N/A 05/17/2014   Procedure: ROBOTIC ASSISTED LAPAROSCOPIC RADICAL PROSTATECTOMY LEVEL 2;  Surgeon: Heloise Purpura, MD;  Location: [facility];  Service: Urology;  Laterality: N/A;   SKIN TAG REMOVAL     15 lesions   VASECTOMY     VENTRAL HERNIA REPAIR N/A 03/27/2016   Procedure: LAPAROSCOPIC VENTRAL HERNIA;  Surgeon: Ovidio Kin, MD;  Location: [facility];  Service: General;  Laterality: N/A;  WITH MESH     Current Medications: Current Meds  Medication Sig   amLODipine (NORVASC) 5 MG tablet Take 1 tablet (5 mg total) by mouth daily.   aspirin EC 81 MG tablet Take 81 mg by mouth daily. Swallow whole.   Biotin 1000 MCG tablet Take 1,000 mcg by mouth daily.   Boswellia-Glucosamine-Vit D (GLUCOSAMINE COMPLEX -BOSWELLIA PO) Take 1,500 mg by mouth daily.   Cholecalciferol (VITAMIN D-1000 MAX ST) 25 MCG (1000 UT) tablet Take 1,000 Units by mouth daily.   Coenzyme Q10 (CO Q 10) 100 MG CAPS Take by mouth.   ezetimibe (ZETIA) 10 MG tablet TAKE ONE TABLET BY MOUTH ONE TIME DAILY   fluticasone (FLONASE) 50 MCG/ACT nasal spray Place 1 spray into both nostrils at bedtime as needed for allergies or rhinitis.   Garlic 1000 MG CAPS Take 1,000 mg by mouth daily.   hydrALAZINE (APRESOLINE) 10 MG tablet Take 1 tablet (10 mg total) by mouth 3 (three) times daily.   Multiple Vitamins-Minerals (MULTIVITAMIN WITH MINERALS) tablet Take 1 tablet by mouth daily. Men   olmesartan (BENICAR) 20 MG tablet Take 1 tablet (20 mg total) by mouth daily.   Omega-3 Fatty Acids (OMEGA 3 PO) Take 1,500 mg by mouth daily.   rosuvastatin (CRESTOR) 40 MG tablet Take 1 tablet (40 mg total) by mouth daily.   vortioxetine HBr (TRINTELLIX) 20 MG TABS tablet Take 1 tablet (20 mg total) by mouth daily.     Allergies:   Amoxicillin-pot clavulanate and Topiramate   Social History   Socioeconomic History   Marital status: Married    Spouse name: Thomas [name]   Number of children: 2   Years of education: Not on file   Highest education level: Not on file  Occupational History   Occupation: Retired   Occupation: Retired from the car business but works part time at [employer]  Tobacco Use   Smoking status: Never   Smokeless tobacco: Never  Vaping Use   Vaping status: Never Used  Substance and Sexual Activity   Alcohol use: No    Alcohol/week: 0.0 standard drinks of alcohol   Drug use: No   Sexual activity: Yes     Partners: Male  Other Topics Concern   Not on file  Social History Narrative   Not on file   Social Drivers of Health   Financial Resource Strain: Not on file  Food Insecurity: Not on file  Transportation Needs: Not on file  Physical Activity: Not on file  Stress: Not on file  Social Connections: Not on file     Family History: The patient's family history includes Alcohol abuse in his mother and another family member; Anxiety disorder in his mother and another family member; Bipolar disorder in his mother and another family member; COPD in his mother; Cancer in his  father and mother; Depression in his mother and another family member; Drug abuse in his father and another family member; Eating disorder in his mother and another family member; Heart attack in his mother and paternal grandfather; Heart disease in his mother and another family member; Heart failure in his mother; Hyperlipidemia in his mother; Hypertension in his mother; Kidney disease in his mother and another family member; Ovarian cancer in his mother.  ROS:   Please see the history of present illness.     All other systems reviewed and are negative.  EKGs/Labs/Other Studies Reviewed:    The following studies were reviewed today:   EKG:   01/08/22: Sinus rhythm, PVC, rate 64, no ST abnormalities 03/02/2023: Sinus rhythm, rate 60, nonspecific T wave flattening  Recent Labs: 04/09/2022: TSH 2.540 10/14/2022: BUN 22; Creatinine, Ser 0.90; Platelets 167 10/19/2022: Hemoglobin 15.0; Potassium 4.0; Sodium 141  Recent Lipid Panel    Component Value Date/Time   CHOL 105 01/08/2022 1034   TRIG 83 01/08/2022 1034   HDL 40 01/08/2022 1034   CHOLHDL 2.6 01/08/2022 1034   LDLCALC 48 01/08/2022 1034    Physical Exam:    VS:  BP (!) 152/90   Pulse 60   Ht 5\' 11"  (1.803 m)   Wt 277 lb (125.6 kg)   SpO2 97%   BMI 38.63 kg/m     Wt Readings from Last 3 Encounters:  03/02/23 277 lb (125.6 kg)  01/22/23 274 lb (124.3  kg)  10/28/22 280 lb (127 kg)     GEN:  Well nourished, well developed in no acute distress HEENT: Normal NECK: No JVD; No carotid bruits LYMPHATICS: No lymphadenopathy CARDIAC: RRR, 3/6 systolic murmur RESPIRATORY:  Clear to auscultation without rales, wheezing or rhonchi  ABDOMEN: Soft, non-tender, non-distended MUSCULOSKELETAL:  No edema; No deformity  SKIN: Warm and dry NEUROLOGIC:  Alert and oriented x 3 PSYCHIATRIC:  Normal affect   ASSESSMENT:    1. Aortic valve stenosis, etiology of cardiac valve disease unspecified   2. Essential hypertension   3. CAD in native artery   4. Hyperlipidemia, unspecified hyperlipidemia type     PLAN:    Aortic stenosis:  Echo 09/2022 showed normal biventricular function, severe aortic stenosis (mean gradient 47 mmHg, AVA 0.8 cm).  LHC/RHC 10/2022 showed 20% mid LAD stenosis, normal filling pressures. -He had previously been asymptomatic but now reporting dyspnea on exertion.  Suspect this is due to his aortic stenosis.  He had previously been evaluated by valve clinic with Dr Clifton James, had discussed TAVR versus SAVR, patient states was leaning towards SAVR.  Will discuss with Dr. Clifton James and refer to cardiothoracic surgery  CAD: Coronary CTA on 02/23/2019 showed nonobstructive CAD, calcium score 113 (87th percentile).  Echocardiogram 05/28/2021 showed EF 65 to 70%, moderate LVH, grade 1 diastolic dysfunction, normal RV function, severe left atrial enlargement, moderate aortic stenosis. -Continue rosuvastatin and Zetia  PVCs: Zio patch x 7 days 05/2022 showed frequent PVCs (7.7% of beats).  Has had low resting heart rate, not on AV nodal block.  Continue to monitor  Hypertension: On valsartan-HCTZ 320-25 mg daily.  BP elevated in clinic today, asked to check BP twice daily for next week and let us know results  Hyperlipidemia: On rosuvastatin 20 mg daily and Zetia 10 mg daily.  LDL 47 on 12/10/22  OSA: severe on sleep study 01/2022, started  CPAP.  Reports compliance   RTC in 4 months   Medication Adjustments/Labs and Tests Ordered: Current medicines are  reviewed at length with the patient today.  Concerns regarding medicines are outlined above.  Orders Placed This Encounter  Procedures   Ambulatory referral to Cardiothoracic Surgery   EKG 12-Lead   No orders of the defined types were placed in this encounter.   Patient Instructions  Medication Instructions:  Continue current medications *If you need a refill on your cardiac medications before your next appointment, please call your pharmacy*   Lab Work: none If you have labs (blood work) drawn today and your tests are completely normal, you will receive your results only by: MyChart Message (if you have MyChart) OR A paper copy in the mail If you have any lab test that is abnormal or we need to change your treatment, we will call you to review the results.   Testing/Procedures: none   Follow-Up: At [facility], you and your health needs are our priority.  As part of our continuing mission to provide you with exceptional heart care, we have created designated Provider Care Teams.  These Care Teams include your primary Cardiologist (physician) and Advanced Practice Providers (APPs -  Physician Assistants and Nurse Practitioners) who all work together to provide you with the care you need, when you need it.  We recommend signing up for the patient portal called "MyChart".  Sign up information is provided on this After Visit Summary.  MyChart is used to connect with patients for Virtual Visits (Telemedicine).  Patients are able to view lab/test results, encounter notes, upcoming appointments, etc.  Non-urgent messages can be sent to your provider as well.   To learn more about what you can do with MyChart, go to [email or web address].    Your next appointment:   4 month(s)  Provider:   Little Ishikawa, MD     Other Instructions Amb referral  to Cardiac thoracic Surgery  Please check blood pressure twice a day and send via my chart or call office         Signed, Thomas Ishikawa, MD  03/02/2023 12:32 PM    [facility]

## 2023-03-08 ENCOUNTER — Encounter: Payer: Self-pay | Admitting: Cardiology

## 2023-03-10 NOTE — Telephone Encounter (Signed)
BP elevated, recommend increase amlodipine to 10 mg daily

## 2023-03-25 ENCOUNTER — Encounter: Payer: Self-pay | Admitting: Surgery

## 2023-03-25 ENCOUNTER — Institutional Professional Consult (permissible substitution) (INDEPENDENT_AMBULATORY_CARE_PROVIDER_SITE_OTHER): Payer: Medicare Other | Admitting: Surgery

## 2023-03-25 VITALS — BP 151/90 | HR 65 | Resp 20 | Ht 71.0 in | Wt 283.0 lb

## 2023-03-25 DIAGNOSIS — I350 Nonrheumatic aortic (valve) stenosis: Secondary | ICD-10-CM

## 2023-03-25 NOTE — Progress Notes (Signed)
 Cardiothoracic Surgery Consultation  PCP is Charlott Dorn LABOR, MD Referring Provider is Kate Lonni CROME*  Chief Complaint  Patient presents with   Aortic Stenosis    New patient consult, review studies    HPI:  The patient is a 68 year old gentleman with history of hypertension, hyperlipidemia, prostate cancer status post robotic assisted radical prostatectomy in 2016, and aortic stenosis followed by Dr. Kate.  His most recent echocardiogram on 10/01/2022 showed a trileaflet aortic valve with severe calcification and thickening.  The mean gradient had increased to 47 mmHg with a peak of 80 mmHg and a valve area by VTI of 0.84 cm.  Dimensionless index was 0.22.  Left ventricular ejection fraction was 60 to 65% with moderate concentric LVH and grade 1 diastolic dysfunction.  He was referred to Dr. Verlin for valve clinic evaluation and underwent cardiac catheterization on 10/19/2022.  This showed mild nonobstructive disease in the LAD and otherwise normal coronary arteries.  Right heart pressures were normal with a PA pressure of 32/14 and a wedge pressure of 8.  The mean gradient across the aortic valve at cath was measured at 28.5 mmHg.  He underwent a gated cardiac CTA showing an aortic valve calcium  score of 1702.  The valve was severely thickened and functionally bicuspid with a raphae between the left and right cusps.  There was severe calcification and reduced leaflet excursion.  There was no ascending aortic aneurysm.  He was seen by Dr. Kate on 03/02/2023 and reported onset of shortness of breath with exertion.  He is also reported some vague left-sided chest pressure which he says is present most of the time.  He denies any dizziness or syncope.  He has had no peripheral edema.  He denies orthopnea and PND.  He is here today with his wife.  She underwent meningioma resection last spring and has undergone 4 surgeries for skull and wound infection with subsequent  placement of a titanium mesh plate and intravenous antibiotics.  She just had her PICC line removed and is going to be on chronic suppressive oral antibiotics.  She has had some seizures, most recently a week ago when she was taken off Keppra.  She is now back on Keppra but cannot drive for period of time.  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   Shortness of breath on exertion    Ventral hernia without obstruction or gangrene 03/27/2016   Vitamin D  deficiency     Past Surgical History:  Procedure Laterality Date   Epididymis Excision Left    Spermocele   HEMORROIDECTOMY  HERNIA REPAIR Bilateral 1967 and 1986   x2 open inguinal hernia repairs   INCISION AND DRAINAGE OF WOUND  2017   Wound Infection Post OP   LYMPHADENECTOMY Bilateral 05/17/2014   Procedure: PELVIC LYMPHADENECTOMY;  Surgeon: Gretel Ferrara, MD;  Location: [facility];  Service: Urology;  Laterality: Bilateral;   PROSTATE BIOPSY  03/22/14   Gretel Ferrara   RIGHT/LEFT HEART CATH AND CORONARY ANGIOGRAPHY N/A 10/19/2022   Procedure: RIGHT/LEFT HEART CATH AND CORONARY ANGIOGRAPHY;  Surgeon: Verlin Lonni BIRCH, MD;  Location: [facility];  Service: Cardiovascular;  Laterality: N/A;   ROBOT ASSISTED LAPAROSCOPIC RADICAL PROSTATECTOMY N/A 05/17/2014    Procedure: ROBOTIC ASSISTED LAPAROSCOPIC RADICAL PROSTATECTOMY LEVEL 2;  Surgeon: Gretel Ferrara, MD;  Location: [facility];  Service: Urology;  Laterality: N/A;   SKIN TAG REMOVAL     15 lesions   VASECTOMY     VENTRAL HERNIA REPAIR N/A 03/27/2016   Procedure: LAPAROSCOPIC VENTRAL HERNIA;  Surgeon: Alm Angle, MD;  Location: [facility];  Service: General;  Laterality: N/A;  WITH MESH    Family History  Problem Relation Age of Onset   Eating disorder Mother    Alcohol abuse Mother    Bipolar disorder Mother    Anxiety disorder Mother    Depression Mother    Kidney disease Mother    Heart disease Mother    Hyperlipidemia Mother    Hypertension Mother    Heart attack Mother    Cancer Mother    COPD Mother    Ovarian cancer Mother    Heart failure Mother    Drug abuse Father    Cancer Father    Heart attack Paternal Grandfather    Heart disease Other    Kidney disease Other    Depression Other    Anxiety disorder Other    Bipolar disorder Other    Alcohol abuse Other    Drug abuse Other    Eating disorder Other     Social History Social History   Tobacco Use   Smoking status: Never   Smokeless tobacco: Never  Vaping Use   Vaping status: Never Used  Substance Use Topics   Alcohol use: No    Alcohol/week: 0.0 standard drinks of alcohol   Drug use: No    Current Outpatient Medications  Medication Sig Dispense Refill   amLODipine  (NORVASC ) 5 MG tablet Take 1 tablet (5 mg total) by mouth daily. 90 tablet 3   aspirin  EC 81 MG tablet Take 81 mg by mouth daily. Swallow whole.     Biotin 1000 MCG tablet Take 1,000 mcg by mouth daily.     Boswellia-Glucosamine-Vit D (GLUCOSAMINE COMPLEX -BOSWELLIA PO) Take 1,500 mg by mouth daily.     Cholecalciferol (VITAMIN D -1000 MAX ST) 25 MCG (1000 UT) tablet Take 1,000 Units by mouth daily.     Coenzyme Q10 (CO Q 10) 100 MG CAPS Take by mouth.     ezetimibe  (ZETIA ) 10 MG tablet TAKE ONE TABLET BY MOUTH ONE TIME DAILY 90 tablet 1    fluticasone  (FLONASE ) 50 MCG/ACT nasal spray Place 1 spray into both nostrils at bedtime as needed for allergies or rhinitis.     Garlic 1000 MG CAPS Take 1,000 mg by mouth daily.     hydrALAZINE  (APRESOLINE ) 10 MG tablet Take 1 tablet (10 mg total) by mouth 3 (three) times daily. 270 tablet 3   Multiple Vitamins-Minerals (MULTIVITAMIN WITH MINERALS) tablet Take 1 tablet by mouth daily. Men  olmesartan  (BENICAR ) 20 MG tablet Take 1 tablet (20 mg total) by mouth daily. 90 tablet 3   Omega-3 Fatty Acids (OMEGA 3 PO) Take 1,500 mg by mouth daily.     rosuvastatin  (CRESTOR ) 40 MG tablet Take 1 tablet (40 mg total) by mouth daily. 90 tablet 3   vortioxetine  HBr (TRINTELLIX ) 20 MG TABS tablet Take 1 tablet (20 mg total) by mouth daily. 30 tablet 0   hydrochlorothiazide  (HYDRODIURIL ) 25 MG tablet Take 1 tablet (25 mg total) by mouth daily. 90 tablet 3   No current facility-administered medications for this visit.    Allergies  Allergen Reactions   Amoxicillin -Pot Clavulanate Other (See Comments) and Diarrhea   Topiramate Other (See Comments)    Review of Systems  Constitutional:  Negative for activity change and fatigue.  HENT:  Positive for hearing loss. Negative for dental problem.        Sees his dentist every 6 months.  Eyes: Negative.   Respiratory:  Positive for shortness of breath.   Cardiovascular:  Positive for chest pain. Negative for palpitations and leg swelling.  Gastrointestinal: Negative.   Endocrine: Negative.   Genitourinary: Negative.   Musculoskeletal:  Positive for back pain.  Skin: Negative.   Allergic/Immunologic: Negative.   Neurological:  Negative for dizziness and syncope.  Hematological: Negative.   Psychiatric/Behavioral: Negative.      BP (!) 151/90 (BP Location: Left Arm, Patient Position: Sitting, Cuff Size: Large)   Pulse 65   Resp 20   Ht 5' 11 (1.803 m)   Wt 283 lb (128.4 kg)   SpO2 96% Comment: RA  BMI 39.47 kg/m  Physical  Exam Constitutional:      Appearance: Normal appearance. He is obese.  HENT:     Head: Normocephalic and atraumatic.  Eyes:     Extraocular Movements: Extraocular movements intact.     Pupils: Pupils are equal, round, and reactive to light.  Neck:     Comments: Bilateral cervical bruit or transmitted murmur. Cardiovascular:     Rate and Rhythm: Normal rate and regular rhythm.     Pulses: Normal pulses.     Heart sounds: Murmur heard.     Comments: 3/6 systolic murmur along the right sternal border.  There is no diastolic murmur. Pulmonary:     Effort: Pulmonary effort is normal.     Breath sounds: Normal breath sounds.  Abdominal:     Tenderness: There is no abdominal tenderness.  Musculoskeletal:        General: No swelling.     Cervical back: No tenderness.  Skin:    General: Skin is warm and dry.  Neurological:     General: No focal deficit present.     Mental Status: He is alert and oriented to person, place, and time.  Psychiatric:        Mood and Affect: Mood normal.        Behavior: Behavior normal.      Diagnostic Tests:    ECHOCARDIOGRAM REPORT       Patient Name:   Thomas [name] Date of Exam: 10/01/2022  Medical Rec #:  [id number]       Height:       71.0 in  Accession #:    [id number]      Weight:       277.4 lb  Date of Birth:  1956-01-29        BSA:          2.423 m  Patient Age:    67 years        BP:           120/78 mmHg  Patient Gender: M               HR:           62 bpm.  Exam Location:  [address]   Procedure: 2D Echo, Cardiac Doppler and Color Doppler   Indications:    I35.0 AS    History:        Patient has prior history of Echocardiogram examinations,  most                 recent 03/30/2022. AS, Arrythmias:PVC; Risk Factors:Morbid                  obesity, Hypertension and Dyslipidemia.    Sonographer:    Elsie Bohr RDCS  Referring Phys: [id number] CHRISTOPHER L SCHUMANN   IMPRESSIONS     1. Left ventricular ejection fraction,  by estimation, is 60 to 65%. The  left ventricle has normal function. The left ventricle has no regional  wall motion abnormalities. There is moderate concentric left ventricular  hypertrophy. Left ventricular  diastolic parameters are consistent with Grade I diastolic dysfunction  (impaired relaxation).   2. Right ventricular systolic function is normal. The right ventricular  size is normal. There is normal pulmonary artery systolic pressure. The  estimated right ventricular systolic pressure is 28.6 mmHg.   3. Left atrial size was mild to moderately dilated.   4. The mitral valve is normal in structure. Trivial mitral valve  regurgitation. No evidence of mitral stenosis.   5. The aortic valve is tricuspid. There is severe calcifcation of the  aortic valve. Aortic valve regurgitation is not visualized. Severe aortic  valve stenosis. Aortic valve area, by VTI measures 0.84 cm. Aortic valve  mean gradient measures 47.0 mmHg.   6. The inferior vena cava is normal in size with greater than 50%  respiratory variability, suggesting right atrial pressure of 3 mmHg.   FINDINGS   Left Ventricle: Left ventricular ejection fraction, by estimation, is 60  to 65%. The left ventricle has normal function. The left ventricle has no  regional wall motion abnormalities. The left ventricular internal cavity  size was normal in size. There is   moderate concentric left ventricular hypertrophy. Left ventricular  diastolic parameters are consistent with Grade I diastolic dysfunction  (impaired relaxation).   Right Ventricle: The right ventricular size is normal. No increase in  right ventricular wall thickness. Right ventricular systolic function is  normal. There is normal pulmonary artery systolic pressure. The tricuspid  regurgitant velocity is 2.53 m/s, and   with an assumed right atrial pressure of 3 mmHg, the estimated right  ventricular systolic pressure is 28.6 mmHg.   Left Atrium: Left  atrial size was mild to moderately dilated.   Right Atrium: Right atrial size was normal in size.   Pericardium: There is no evidence of pericardial effusion.   Mitral Valve: The mitral valve is normal in structure. Mild mitral annular  calcification. Trivial mitral valve regurgitation. No evidence of mitral  valve stenosis.   Tricuspid Valve: The tricuspid valve is normal in structure. Tricuspid  valve regurgitation is trivial.   Aortic Valve: The aortic valve is tricuspid. There is severe calcifcation  of the aortic valve. Aortic valve regurgitation is not visualized. Severe  aortic stenosis is present. Aortic valve mean gradient measures 47.0 mmHg.  Aortic valve peak gradient  measures 80.4 mmHg. Aortic valve area, by VTI measures 0.84 cm.   Pulmonic Valve: The pulmonic valve was normal in structure. Pulmonic valve  regurgitation is trivial.   Aorta: The aortic root is normal in size and structure.   Venous: The inferior vena cava is normal in size with greater than 50%  respiratory variability, suggesting right atrial pressure of 3 mmHg.   IAS/Shunts: No atrial level shunt detected by color flow Doppler.     LEFT VENTRICLE  PLAX 2D  LVIDd:         5.10 cm   Diastology  LVIDs:         3.00 cm   LV e' medial:    9.03 cm/s  LV PW:         1.70 cm   LV E/e' medial:  9.7  LV IVS:        1.60 cm   LV e' lateral:   16.20 cm/s  LVOT diam:     2.20 cm   LV E/e' lateral: 5.4  LV SV:         88  LV SV Index:   36  LVOT Area:     3.80 cm     RIGHT VENTRICLE             IVC  RV S prime:     17.20 cm/s  IVC diam: 1.80 cm  TAPSE (M-mode): 2.1 cm  RVSP:           28.6 mmHg   LEFT ATRIUM            Index        RIGHT ATRIUM           Index  LA diam:      5.00 cm  2.06 cm/m   RA Pressure: 3.00 mmHg  LA Vol (A2C): 80.9 ml  33.39 ml/m  RA Area:     17.10 cm  LA Vol (A4C): 108.0 ml 44.57 ml/m  RA Volume:   43.00 ml  17.75 ml/m   AORTIC VALVE  AV Area (Vmax):    0.86 cm  AV  Area (Vmean):   0.86 cm  AV Area (VTI):     0.84 cm  AV Vmax:           448.33 cm/s  AV Vmean:          315.333 cm/s  AV VTI:            1.040 m  AV Peak Grad:      80.4 mmHg  AV Mean Grad:      47.0 mmHg  LVOT Vmax:         102.00 cm/s  LVOT Vmean:        71.100 cm/s  LVOT VTI:          0.231 m  LVOT/AV VTI ratio: 0.22    AORTA  Ao Root diam: 3.20 cm  Ao Asc diam:  3.40 cm   MITRAL VALVE                TRICUSPID VALVE  MV Area (PHT): 2.74 cm     TR Peak grad:   25.6 mmHg  MV Decel Time: 277 msec     TR Vmax:        253.00 cm/s  MV E velocity: 88.00 cm/s   Estimated RAP:  3.00 mmHg  MV A velocity: 101.00 cm/s  RVSP:  28.6 mmHg  MV E/A ratio:  0.87                              SHUNTS                              Systemic VTI:  0.23 m                              Systemic Diam: 2.20 cm   Dalton McleanMD  Electronically signed by Ezra Kanner  Signature Date/Time: 10/01/2022/2:49:29 PM        Final      icians  Panel Physicians Referring Physician Case Authorizing Physician  Verlin Lonni BIRCH, MD (Primary)     Procedures  RIGHT/LEFT HEART CATH AND CORONARY ANGIOGRAPHY   Conclusion      Mid LAD lesion is 20% stenosed.   Mild non-obstructive CAD Normal right and left heart pressures (RA 5, RV 30/5/9, PA 32/14 mean 22, PCWP 8) Severe aortic stenosis by echo (mean gradient by cath is 28.5 mmHg).    Recommendations: Continue planning for SAVR vs TAVR. Medical management of mild CAD   Indications  Severe aortic stenosis [I35.0 (ICD-10-CM)]   Procedural Details  Technical Details Indication: 69 yo male with severe aortic stenosis  Procedure: The risks, benefits, complications, treatment options, and expected outcomes were discussed with the patient. The patient and/or family concurred with the proposed plan, giving informed consent. The patient was sedated with Versed  and Fentanyl . The IV catheter present in the right antecubital vein was  changed for a 5 French sheath. Right heart catheterization performed with a balloon tipped catheter. The right wrist was prepped and draped in a sterile fashion. 1% lidocaine  was used for local anesthesia. Using the modified Seldinger access technique, a 5 French sheath was placed in the right radial artery. 3 mg Verapamil  was given through the sheath. Weight based IV heparin  was given. Standard diagnostic catheters were used to perform selective coronary angiography. I engaged the left main with a 5 Fr EBU 3.5 guiding catheter. LV pressures measured with the JR4 catheter. All catheter exchanges were performed over an exchange length guidewire.   The sheath was removed from the right radial artery and a hemostasis band was applied at the arteriotomy site on the right wrist.      Estimated blood loss <50 mL.   During this procedure medications were administered to achieve and maintain moderate conscious sedation while the patient's heart rate, blood pressure, and oxygen saturation were continuously monitored and I was present face-to-face 100% of this time.   Medications (Filter: Administrations occurring from 1005 to 1118 on 10/19/22) fentaNYL  (SUBLIMAZE ) injection (mcg)  Total dose: 25 mcg Date/Time Rate/Dose/Volume Action   10/19/22 1029 25 mcg Given   midazolam  (VERSED ) injection (mg)  Total dose: 1 mg Date/Time Rate/Dose/Volume Action   10/19/22 1029 1 mg Given   Heparin  (Porcine) in NaCl 1000-0.9 UT/500ML-% SOLN (mL)  Total volume: 1,000 mL Date/Time Rate/Dose/Volume Action   10/19/22 1029 500 mL Given   1029 500 mL Given   lidocaine  (PF) (XYLOCAINE ) 1 % injection (mL)  Total volume: 2 mL Date/Time Rate/Dose/Volume Action   10/19/22 1037 2 mL Given   heparin  sodium (porcine) injection (Units)  Total dose: 6,000 Units Date/Time Rate/Dose/Volume Action   10/19/22 1051 6,000 Units Given  Radial Cocktail/Verapamil  only (mL)  Total volume: 10 mL Date/Time Rate/Dose/Volume Action    10/19/22 1039 10 mL Given    Sedation Time  Sedation Time Physician-1: 33 minutes 57 seconds Radiation/Fluoro  Fluoro time: 6.5 (min) DAP: 40532 (mGycm2) Cumulative Air Kerma: 590 (mGy) Complications  Complications documented before study signed (10/19/2022 11:24 AM)   No complications were associated with this study.  Documented by Carter, Derinda B - 10/19/2022 11:13 AM     Coronary Findings  Diagnostic Dominance: Left Left Anterior Descending  Vessel is large.  Mid LAD lesion is 20% stenosed.    Left Circumflex  Vessel is large.    Right Coronary Artery  Vessel is moderate in size.    Intervention   No interventions have been documented.   Coronary Diagrams  Diagnostic Dominance: Left  Intervention   Implants   No implant documentation for this case.   Syngo Images   Show images for CARDIAC CATHETERIZATION Images on Long Term Storage   Show images for Ayiden, Milliman to Procedure Log  Procedure Log    Hemo Data  Flowsheet Row Most Recent Value  Fick Cardiac Output 7.19 L/min  Fick Cardiac Output Index 2.97 (L/min)/BSA  Aortic Mean Gradient 28.54 mmHg  Aortic Peak Gradient 27.6 mmHg  Aortic Valve Area 1.56  Aortic Value Area Index 0.64 cm2/BSA  RA A Wave 7 mmHg  RA V Wave 6 mmHg  RA Mean 5 mmHg  RV Systolic Pressure 30 mmHg  RV Diastolic Pressure 5 mmHg  RV EDP 9 mmHg  PA Systolic Pressure 32 mmHg  PA Diastolic Pressure 14 mmHg  PA Mean 22 mmHg  PW A Wave 16 mmHg  PW V Wave 14 mmHg  PW Mean 8 mmHg  AO Systolic Pressure 128 mmHg  AO Diastolic Pressure 65 mmHg  AO Mean 89 mmHg  LV Systolic Pressure 164 mmHg  LV Diastolic Pressure -2 mmHg  LV EDP 9 mmHg  Arterial Occlusion Pressure Extended Systolic Pressure 122 mmHg  Arterial Occlusion Pressure Extended Diastolic Pressure 60 mmHg  Arterial Occlusion Pressure Extended Mean Pressure 85 mmHg  Left Ventricular Apex Extended Systolic Pressure 147 mmHg  LVp Diastolic Pressure -10  mmHg  Left Ventricular Apex Extended EDP Pressure 8 mmHg  QP/QS 1  TPVR Index 7.42 HRUI  TSVR Index 30.01 HRUI  PVR SVR Ratio 0.17  TPVR/TSVR Ratio 0.25    ADDENDUM REPORT: 10/27/2022 15:00   CLINICAL DATA:  Aortic Valve pathology with assessment for TAVR   EXAM: Cardiac TAVR CT   TECHNIQUE: The patient was scanned on a Siemens Force 192 slice scanner. A 120 kV retrospective scan was triggered in the descending thoracic aorta at 111 HU's. Gantry rotation speed was 270 msecs and collimation was .9 mm. No beta blockade or nitro were given. The 3D data set was reconstructed in 5% intervals of the R-R cycle. Systolic and diastolic phases were analyzed on a dedicated work station using MPR, MIP and VRT modes. The patient received 100 cc of contrast.   FINDINGS: Aortic Valve: Severely thickened aortic valve, functionally bicuspid with a small left-right raphe with calcification and reduced excursion   Annular calcification: None   Aortic Valve Calcium  Score: 1702   Presence of basal septal hypertrophy: No   Perimembranous septal diameter: 8 mm   Mitral Valve: No calcifications.   Aortic Annulus Measurements- 30% phase   Major annulus diameter: 28 mm   Minor annulus diameter: 23 mm   Annular perimeter: 80 mm  Annular area: 4.69 cm2   Aortic Root Measurements- 74%   Sinotubular Junction: 31 mm   Ascending Thoracic Aorta: 34 mm   Aortic Arch: 32 mm   Descending Thoracic Aorta: 26 mm   Aortic atherosclerosis.   Sinus of Valsalva Measurements:   Right coronary cusp width: 29 mm   Left coronary cusp width: 30 mm   Non coronary cusp width: 31 mm   Coronary Artery Height above Annulus:   Left Main: 14 mm   Left SoV height: 22 mm   Right Coronary: 15 mm   Right SoV height: 22 mm   Optimum Fluoroscopic Angle for Delivery: LAO 7, CAU 6   Cusp overlay view angle: RAO 0, CAU 15   Valves for structural team consideration: 26 mm Sapien vs a 29  mm Evolut Valve   Non TAVR Valve Findings:   Coronary Arteries: Normal coronary origin- very short left main. Study not completed with nitroglycerin .   Coronary Calcium  Score:   Left main: 0   Left anterior descending artery: 112   Left circumflex artery: 222   Right coronary artery:0   Total: 334   Percentile: 74th for age, sex, and race matched control.   Systemic veins: Normal anatomy   Main Pulmonary artery: Normal caliber   Pulmonary veins: Normal variant anatomy, right middle pulmonary vein   Left atrial appendage: Patent   Interatrial septum: No clear communications   Left ventricle: Normal size   Right ventricle: Normal size   Left atrium: Dilated   Right atrium: Dilated   Pericardium: No calcifications   Extra Cardiac Findings as per separate reporting.   Notable artifacts: Mild body attenuation, systolic phase motion artifact   IMPRESSION: 1. Moderate to severe aortic stenosis. Findings pertinent to TAVR procedure are detailed above.   RECOMMENDATIONS:   The proposed cut-off value of 1,651 AU yielded a 93 % sensitivity and 75 % specificity in grading AS severity in patients with classical low-flow, low-gradient AS. Proposed different cut-off values to define severe AS for men and women as 2,065 AU and 1,274 AU, respectively. The joint European and American recommendations for the assessment of AS consider the aortic valve calcium  score as a continuum - a very high calcium  score suggests severe AS and a low calcium  score suggests severe AS is unlikely.   Donney VEAR Jarome LULLA Stephen RENETTE, et al. 2017 ESC/EACTS Guidelines for the management of valvular heart disease. Eur Heart J [phone]   Coronary artery calcium  (CAC) score is a strong predictor of incident coronary heart disease (CHD) and provides predictive information beyond traditional risk factors. CAC scoring is reasonable to use in the decision to withhold, postpone, or  initiate statin therapy in intermediate-risk or selected borderline-risk asymptomatic adults (age 59-75 years and LDL-C >=70 to <190 mg/dL) who do not have diabetes or established atherosclerotic cardiovascular disease (ASCVD).* In intermediate-risk (10-year ASCVD risk >=7.5% to <20%) adults or selected borderline-risk (10-year ASCVD risk >=5% to <7.5%) adults in whom a CAC score is measured for the purpose of making a treatment decision the following recommendations have been made:   If CAC = 0, it is reasonable to withhold statin therapy and reassess in 5 to 10 years, as long as higher risk conditions are absent (diabetes mellitus, family history of premature CHD in first degree relatives (males <55 years; females <65 years), cigarette smoking, LDL >=190 mg/dL or other independent risk factors).   If CAC is 1 to 99, it is reasonable to initiate statin therapy  for patients >=16 years of age.   If CAC is >=100 or >=75th percentile, it is reasonable to initiate statin therapy at any age.   Cardiology referral should be considered for patients with CAC scores >=400 or >=75th percentile.   *2018 AHA/ACC/AACVPR/AAPA/ABC/ACPM/ADA/AGS/APhA/ASPC/NLA/PCNA Guideline on the Management of Blood Cholesterol: A Report of the American College of Cardiology/American Heart Association Task Force on Clinical Practice Guidelines. J Am Coll Cardiol. 2019;73(24):3168-3209.   Mahesh  Chandrasekhar     Electronically Signed   By: Stanly Leavens M.D.   On: 10/27/2022 15:00   Impression:  This 68 year old gentleman has stage D, severe, symptomatic aortic stenosis with NYHA class II symptoms of exertional shortness of breath consistent with chronic diastolic congestive heart failure.  I have personally reviewed his 2D echocardiogram, cardiac catheterization, and gated cardiac CTA.  His echo shows a heavily calcified and thickened aortic valve with restricted leaflet mobility.  The mean  gradient is 47 mmHg with a valve area of 0.8 cm consistent with severe aortic stenosis.  Left ventricular systolic function is normal.  His catheterization shows mild nonobstructive coronary disease with normal right heart pressures.  His gated cardiac CTA shows a functionally bicuspid aortic valve that is severely thickened with restricted leaflet mobility.  The calcium  score was 1702 consistent with severe aortic stenosis.  I agree that aortic valve replacement is indicated in this patient for relief of his symptoms and to prevent progressive left ventricular dysfunction.  Given his age of 41 with a heavily calcified bicuspid aortic valve I think that open surgical aortic valve replacement would be the best treatment option for him.  I reviewed the echo, cath, and CT images with him and his wife and answered all their questions.  We discussed the pros and cons of mechanical and bioprosthetic prostheses and my recommendation for a bovine pericardial bioprosthesis.  He is in agreement with that. I discussed the operative procedure with the patient and his wife including alternatives, benefits and risks; including but not limited to bleeding, blood transfusion, infection, stroke, myocardial infarction, graft failure, heart block requiring a permanent pacemaker, organ dysfunction, and death.  Rosalynn FORBES Ku understands and agrees to proceed.  He would like to wait until April to have his surgery as long as things remain stable since his wife is still recovering from her meningioma resection and recurrent procedures related to bone and wound infection with seizure disorder that prevents her from driving at the current time.  I think that is reasonable given his relatively mild symptoms and noncritical aortic stenosis.  He will continue to monitor his symptoms and let us  know if there is any progression.   Plan:  He is going to call us  back to schedule aortic valve replacement using a bioprosthetic valve after he  decides on the timing of it with his wife.  I spent 60 minutes performing this consultation and > 50% of this time was spent face to face counseling and coordinating the care of this patient's severe symptomatic aortic stenosis.   Dorise MARLA Fellers, MD [facility] [phone]

## 2023-03-29 MED ORDER — AMLODIPINE BESYLATE 10 MG PO TABS: 10.0000 mg | ORAL_TABLET | Freq: Every day | ORAL | 2 refills | Status: DC

## 2023-03-29 NOTE — Addendum Note (Signed)
 Addended by: Marilynn Rail on: 03/29/2023 03:02 PM   Modules accepted: Orders

## 2023-04-01 ENCOUNTER — Telehealth: Payer: Self-pay | Admitting: Adult Health

## 2023-04-01 NOTE — Telephone Encounter (Signed)
Thank you. Updated sticky note.  

## 2023-04-01 NOTE — Telephone Encounter (Signed)
Isayah has been re-approved for pt assistance for his [employer].  He is approved through 03/15/24.

## 2023-04-18 ENCOUNTER — Other Ambulatory Visit: Payer: Self-pay | Admitting: Cardiology

## 2023-04-21 ENCOUNTER — Other Ambulatory Visit (HOSPITAL_COMMUNITY): Payer: Medicare Other

## 2023-04-22 ENCOUNTER — Ambulatory Visit: Payer: Medicare Other | Admitting: Cardiovascular Disease

## 2023-04-26 ENCOUNTER — Encounter: Payer: Self-pay | Admitting: *Deleted

## 2023-04-26 ENCOUNTER — Other Ambulatory Visit: Payer: Self-pay | Admitting: *Deleted

## 2023-04-26 DIAGNOSIS — R7989 Other specified abnormal findings of blood chemistry: Secondary | ICD-10-CM

## 2023-04-26 DIAGNOSIS — I350 Nonrheumatic aortic (valve) stenosis: Secondary | ICD-10-CM

## 2023-04-26 DIAGNOSIS — Z5181 Encounter for therapeutic drug level monitoring: Secondary | ICD-10-CM

## 2023-05-06 NOTE — Pre-Procedure Instructions (Signed)
 Surgical Instructions   Your procedure is scheduled on May 11, 2023. Report to [facility] Main Entrance "A" at 5:30 A.M., then check in with the Admitting office. Any questions or running late day of surgery: call [phone]  Questions prior to your surgery date: call [phone], Monday-Friday, 8am-4pm. If you experience any cold or flu symptoms such as cough, fever, chills, shortness of breath, etc. between now and your scheduled surgery, please notify us at the above number.     Remember:  Do not eat or drink after midnight the night before your surgery    Take these medicines the morning of surgery with A SIP OF WATER: amLODipine (NORVASC)  ezetimibe (ZETIA)  hydrALAZINE (APRESOLINE)  rosuvastatin (CRESTOR)  vortioxetine HBr (TRINTELLIX)    Continue taking your Aspirin through the day before surgery. DO NOT take any the morning of surgery.   One week prior to surgery, STOP taking any Aleve, Naproxen, Ibuprofen, Motrin, Advil, Goody's, BC's, all herbal medications, fish oil, and non-prescription vitamins.                     Do NOT Smoke (Tobacco/Vaping) for 24 hours prior to your procedure.  If you use a CPAP at night, you may bring your mask/headgear for your overnight stay.   You will be asked to remove any contacts, glasses, piercing's, hearing aid's, dentures/partials prior to surgery. Please bring cases for these items if needed.    Patients discharged the day of surgery will not be allowed to drive home, and someone needs to stay with them for 24 hours.  SURGICAL WAITING ROOM VISITATION Patients may have no more than 2 support people in the waiting area - these visitors may rotate.   Pre-op nurse will coordinate an appropriate time for 1 ADULT support person, who may not rotate, to accompany patient in pre-op.  Children under the age of 31 must have an adult with them who is not the patient and must remain in the main waiting area with an adult.  If the  patient needs to stay at the hospital during part of their recovery, the visitor guidelines for inpatient rooms apply.  Please refer to the [facility] website for the visitor guidelines for any additional information.   If you received a COVID test during your pre-op visit  it is requested that you wear a mask when out in public, stay away from anyone that may not be feeling well and notify your surgeon if you develop symptoms. If you have been in contact with anyone that has tested positive in the last 10 days please notify you surgeon.      Pre-operative CHG Bathing Instructions   You can play a key role in reducing the risk of infection after surgery. Your skin needs to be as free of germs as possible. You can reduce the number of germs on your skin by washing with CHG (chlorhexidine gluconate) soap before surgery. CHG is an antiseptic soap that kills germs and continues to kill germs even after washing.   DO NOT use if you have an allergy to chlorhexidine/CHG or antibacterial soaps. If your skin becomes reddened or irritated, stop using the CHG and notify one of our RNs at [phone].              TAKE A SHOWER THE NIGHT BEFORE SURGERY AND THE DAY OF SURGERY    Please keep in mind the following:  DO NOT shave, including legs and underarms, 48 hours  prior to surgery.   You may shave your face before/day of surgery.  Place clean sheets on your bed the night before surgery Use a clean washcloth (not used since being washed) for each shower. DO NOT sleep with pet's night before surgery.  CHG Shower Instructions:  Wash your face and private area with normal soap. If you choose to wash your hair, wash first with your normal shampoo.  After you use shampoo/soap, rinse your hair and body thoroughly to remove shampoo/soap residue.  Turn the water OFF and apply half the bottle of CHG soap to a CLEAN washcloth.  Apply CHG soap ONLY FROM YOUR NECK DOWN TO YOUR TOES (washing for 3-5 minutes)   DO NOT use CHG soap on face, private areas, open wounds, or sores.  Pay special attention to the area where your surgery is being performed.  If you are having back surgery, having someone wash your back for you may be helpful. Wait 2 minutes after CHG soap is applied, then you may rinse off the CHG soap.  Pat dry with a clean towel  Put on clean pajamas    Additional instructions for the day of surgery: DO NOT APPLY any lotions, deodorants, cologne, or perfumes.   Do not wear jewelry or makeup Do not wear nail polish, gel polish, artificial nails, or any other type of covering on natural nails (fingers and toes) Do not bring valuables to the hospital. [facility] is not responsible for valuables/personal belongings. Put on clean/comfortable clothes.  Please brush your teeth.  Ask your nurse before applying any prescription medications to the skin.

## 2023-05-07 ENCOUNTER — Encounter (HOSPITAL_COMMUNITY): Payer: Self-pay

## 2023-05-07 ENCOUNTER — Ambulatory Visit (HOSPITAL_COMMUNITY)
Admission: RE | Admit: 2023-05-07 | Discharge: 2023-05-07 | Disposition: A | Discharge status: Home / Self Care | Payer: Medicare Other | Source: Ambulatory Visit | Attending: Surgery | Admitting: Surgery

## 2023-05-07 ENCOUNTER — Encounter (HOSPITAL_COMMUNITY)
Admission: RE | Admit: 2023-05-07 | Discharge: 2023-05-07 | Disposition: A | Discharge status: Home / Self Care | Payer: Medicare Other | Source: Ambulatory Visit | Attending: Surgery | Admitting: Surgery

## 2023-05-07 ENCOUNTER — Encounter: Payer: Self-pay | Admitting: *Deleted

## 2023-05-07 ENCOUNTER — Other Ambulatory Visit: Payer: Self-pay

## 2023-05-07 VITALS — BP 122/83 | HR 58 | Temp 97.8°F | Resp 18 | Ht 71.0 in | Wt 282.0 lb

## 2023-05-07 DIAGNOSIS — I350 Nonrheumatic aortic (valve) stenosis: Secondary | ICD-10-CM | POA: Insufficient documentation

## 2023-05-07 DIAGNOSIS — Z01818 Encounter for other preprocedural examination: Secondary | ICD-10-CM | POA: Diagnosis present

## 2023-05-07 DIAGNOSIS — R7989 Other specified abnormal findings of blood chemistry: Secondary | ICD-10-CM | POA: Insufficient documentation

## 2023-05-07 DIAGNOSIS — Z5181 Encounter for therapeutic drug level monitoring: Secondary | ICD-10-CM

## 2023-05-07 HISTORY — DX: Personal history of other diseases of the digestive system: Z87.19

## 2023-05-07 HISTORY — DX: Cardiac arrhythmia, unspecified: I49.9

## 2023-05-07 HISTORY — DX: Cardiac murmur, unspecified: R01.1

## 2023-05-07 LAB — CBC
HCT: 49.4 % (ref 39.0–52.0)
Hemoglobin: 16.5 g/dL (ref 13.0–17.0)
MCH: 29.7 pg (ref 26.0–34.0)
MCHC: 33.4 g/dL (ref 30.0–36.0)
MCV: 89 fL (ref 80.0–100.0)
Platelets: 162 10*3/uL (ref 150–400)
RBC: 5.55 MIL/uL (ref 4.22–5.81)
RDW: 13.4 % (ref 11.5–15.5)
WBC: 5.6 10*3/uL (ref 4.0–10.5)
nRBC: 0 % (ref 0.0–0.2)

## 2023-05-07 LAB — COMPREHENSIVE METABOLIC PANEL
ALT: 50 U/L — ABNORMAL HIGH (ref 0–44)
AST: 42 U/L — ABNORMAL HIGH (ref 15–41)
Albumin: 4.5 g/dL (ref 3.5–5.0)
Alkaline Phosphatase: 64 U/L (ref 38–126)
Anion gap: 9 (ref 5–15)
BUN: 19 mg/dL (ref 8–23)
CO2: 29 mmol/L (ref 22–32)
Calcium: 10.3 mg/dL (ref 8.9–10.3)
Chloride: 103 mmol/L (ref 98–111)
Creatinine, Ser: 1.49 mg/dL — ABNORMAL HIGH (ref 0.61–1.24)
GFR, Estimated: 51 mL/min — ABNORMAL LOW (ref >60)
Glucose, Bld: 109 mg/dL — ABNORMAL HIGH (ref 70–99)
Potassium: 4.7 mmol/L (ref 3.5–5.1)
Sodium: 141 mmol/L (ref 135–145)
Total Bilirubin: 1 mg/dL (ref 0.0–1.2)
Total Protein: 7.3 g/dL (ref 6.5–8.1)

## 2023-05-07 LAB — URINALYSIS, ROUTINE W REFLEX MICROSCOPIC
Bilirubin Urine: NEGATIVE
Glucose, UA: NEGATIVE mg/dL
Hgb urine dipstick: NEGATIVE
Ketones, ur: NEGATIVE mg/dL
Leukocytes,Ua: NEGATIVE
Nitrite: NEGATIVE
Protein, ur: NEGATIVE mg/dL
Specific Gravity, Urine: 1.017 (ref 1.005–1.030)
pH: 6 (ref 5.0–8.0)

## 2023-05-07 LAB — SURGICAL PCR SCREEN
MRSA, PCR: NEGATIVE
Staphylococcus aureus: NEGATIVE

## 2023-05-07 LAB — PROTIME-INR
INR: 1 (ref 0.8–1.2)
Prothrombin Time: 12.9 s (ref 11.4–15.2)

## 2023-05-07 LAB — HEMOGLOBIN A1C
Hgb A1c MFr Bld: 5.7 % — ABNORMAL HIGH (ref 4.8–5.6)
Mean Plasma Glucose: 116.89 mg/dL

## 2023-05-07 LAB — APTT: aPTT: 37 s — ABNORMAL HIGH (ref 24–36)

## 2023-05-07 NOTE — Progress Notes (Signed)
 PCP - Dr. Eleanora Neighbor Cardiologist - Dr. Epifanio Lesches - Last office visit 03/02/2023  PPM/ICD - Denies Device Orders - n/a Rep Notified - n/a  Chest x-ray - 05/07/2023 EKG - 05/07/2023 Stress Test - Denies ECHO - 10/01/2022 Cardiac Cath - 10/19/2022   Sleep Study - +OSA. Pt wears his CPAP most nights. Pressure setting is 12.  No DM  Last dose of GLP1 agonist- n/a GLP1 instructions: n/a  Blood Thinner Instructions: n/a Aspirin Instructions: Pt instructed to continue taking ASA through the day before surgery and NONE the morning of surgery  NPO after midnight  COVID TEST- n/a   Anesthesia review: Yes. EKG review. Hx of HTN, CAD, OSA.  Patient denies shortness of breath, fever, cough and chest pain at PAT appointment. Pt denies any respiratory illness/infection in the last two months.   All instructions explained to the patient, with a verbal understanding of the material. Patient agrees to go over the instructions while at home for a better understanding. Patient also instructed to self quarantine after being tested for COVID-19. The opportunity to ask questions was provided.

## 2023-05-10 MED ORDER — PHENYLEPHRINE HCL-NACL 20-0.9 MG/250ML-% IV SOLN
30.0000 ug/min | INTRAVENOUS | Status: AC
  Administered 2023-05-11: 25 ug/min via INTRAVENOUS
  Filled 2023-05-10: qty 250

## 2023-05-10 MED ORDER — MANNITOL 20 % IV SOLN
INTRAVENOUS | Status: DC
  Filled 2023-05-10: qty 13

## 2023-05-10 MED ORDER — POTASSIUM CHLORIDE 2 MEQ/ML IV SOLN
80.0000 meq | INTRAVENOUS | Status: DC
  Filled 2023-05-10: qty 40

## 2023-05-10 MED ORDER — TRANEXAMIC ACID 1000 MG/10ML IV SOLN
1.5000 mg/kg/h | INTRAVENOUS | Status: AC
  Administered 2023-05-11: 1.5 mg/kg/h via INTRAVENOUS
  Filled 2023-05-10 (×2): qty 25

## 2023-05-10 MED ORDER — NOREPINEPHRINE 4 MG/250ML-% IV SOLN
0.0000 ug/min | INTRAVENOUS | Status: DC
Start: 2023-05-11 — End: 2023-05-12
  Filled 2023-05-10: qty 250

## 2023-05-10 MED ORDER — VANCOMYCIN HCL 1.5 G IV SOLR
1500.0000 mg | INTRAVENOUS | Status: AC
  Administered 2023-05-11: 1500 mg via INTRAVENOUS
  Filled 2023-05-10: qty 30

## 2023-05-10 MED ORDER — CEFAZOLIN SODIUM-DEXTROSE 2-4 GM/100ML-% IV SOLN
2.0000 g | INTRAVENOUS | Status: AC
  Administered 2023-05-11: 2 g via INTRAVENOUS
  Filled 2023-05-10: qty 100

## 2023-05-10 MED ORDER — PLASMA-LYTE A IV SOLN
INTRAVENOUS | Status: DC
  Filled 2023-05-10: qty 2.5

## 2023-05-10 MED ORDER — CEFAZOLIN SODIUM-DEXTROSE 3-4 GM/150ML-% IV SOLN
3.0000 g | INTRAVENOUS | Status: AC
  Administered 2023-05-11: 3 g via INTRAVENOUS
  Filled 2023-05-10: qty 150

## 2023-05-10 MED ORDER — MILRINONE LACTATE IN DEXTROSE 20-5 MG/100ML-% IV SOLN
0.3000 ug/kg/min | INTRAVENOUS | Status: DC
  Filled 2023-05-10: qty 100

## 2023-05-10 MED ORDER — HEPARIN 30,000 UNITS/1000 ML (OHS) CELLSAVER SOLUTION
Status: DC
  Filled 2023-05-10: qty 1000

## 2023-05-10 MED ORDER — NITROGLYCERIN IN D5W 200-5 MCG/ML-% IV SOLN
2.0000 ug/min | INTRAVENOUS | Status: DC
  Filled 2023-05-10: qty 250

## 2023-05-10 MED ORDER — DEXMEDETOMIDINE HCL IN NACL 400 MCG/100ML IV SOLN
0.1000 ug/kg/h | INTRAVENOUS | Status: AC
  Administered 2023-05-11: .4 ug/kg/h via INTRAVENOUS
  Filled 2023-05-10: qty 100

## 2023-05-10 MED ORDER — TRANEXAMIC ACID (OHS) PUMP PRIME SOLUTION
2.0000 mg/kg | INTRAVENOUS | Status: DC
  Filled 2023-05-10: qty 2.56

## 2023-05-10 MED ORDER — TRANEXAMIC ACID (OHS) BOLUS VIA INFUSION
15.0000 mg/kg | INTRAVENOUS | Status: AC
  Administered 2023-05-11: 1918.5 mg via INTRAVENOUS
  Filled 2023-05-10: qty 1919

## 2023-05-10 MED ORDER — EPINEPHRINE HCL 5 MG/250ML IV SOLN IN NS
0.0000 ug/min | INTRAVENOUS | Status: DC
  Filled 2023-05-10: qty 250

## 2023-05-10 MED ORDER — INSULIN REGULAR(HUMAN) IN NACL 100-0.9 UT/100ML-% IV SOLN
INTRAVENOUS | Status: AC
  Administered 2023-05-11: 1.2 [IU]/h via INTRAVENOUS
  Filled 2023-05-10: qty 100

## 2023-05-10 NOTE — Anesthesia Preprocedure Evaluation (Signed)
 Anesthesia Evaluation  Patient identified by MRN, date of birth, ID band Patient awake    Reviewed: Allergy & Precautions, H&P , NPO status , Patient's Chart, lab work & pertinent test results  Airway Mallampati: II  TM Distance: >3 FB Neck ROM: Full    Dental no notable dental hx.    Pulmonary neg pulmonary ROS   Pulmonary exam normal breath sounds clear to auscultation       Cardiovascular hypertension, Normal cardiovascular exam+ Valvular Problems/Murmurs AS  Rhythm:Regular Rate:Normal  TTE 09/2022: IMPRESSIONS     1. Left ventricular ejection fraction, by estimation, is 60 to 65%. The  left ventricle has normal function. The left ventricle has no regional  wall motion abnormalities. There is moderate concentric left ventricular  hypertrophy. Left ventricular  diastolic parameters are consistent with Grade I diastolic dysfunction  (impaired relaxation).   2. Right ventricular systolic function is normal. The right ventricular  size is normal. There is normal pulmonary artery systolic pressure. The  estimated right ventricular systolic pressure is 28.6 mmHg.   3. Left atrial size was mild to moderately dilated.   4. The mitral valve is normal in structure. Trivial mitral valve  regurgitation. No evidence of mitral stenosis.   5. The aortic valve is tricuspid. There is severe calcifcation of the  aortic valve. Aortic valve regurgitation is not visualized. Severe aortic  valve stenosis. Aortic valve area, by VTI measures 0.84 cm. Aortic valve  mean gradient measures 47.0 mmHg.   6. The inferior vena cava is normal in size with greater than 50%  respiratory variability, suggesting right atrial pressure of 3 mmHg     Neuro/Psych  PSYCHIATRIC DISORDERS Anxiety     negative neurological ROS     GI/Hepatic Neg liver ROS, hiatal hernia,GERD  ,,  Endo/Other  negative endocrine ROS    Renal/GU negative Renal ROS   Hx of  prostate cancer    Musculoskeletal  (+) Arthritis ,    Abdominal   Peds negative pediatric ROS (+)  Hematology  (+) Blood dyscrasia, anemia   Anesthesia Other Findings   Reproductive/Obstetrics negative OB ROS                              Anesthesia Physical Anesthesia Plan  ASA: 4  Anesthesia Plan: General   Post-op Pain Management:    Induction: Intravenous  PONV Risk Score and Plan: 2 and Ondansetron and Treatment may vary due to age or medical condition  Airway Management Planned: Oral ETT  Additional Equipment: Arterial line, CVP, 3D TEE, TEE, PA Cath and Ultrasound Guidance Line Placement  Intra-op Plan:   Post-operative Plan: Extubation in OR and Post-operative intubation/ventilation  Informed Consent: I have reviewed the patients History and Physical, chart, labs and discussed the procedure including the risks, benefits and alternatives for the proposed anesthesia with the patient or authorized representative who has indicated his/her understanding and acceptance.     Dental advisory given  Plan Discussed with: CRNA  Anesthesia Plan Comments:          Anesthesia Quick Evaluation

## 2023-05-10 NOTE — H&P (Signed)
 [address]       [place] [address]             [phone]      Cardiothoracic Surgery Admission History and Physical   PCP is Orson Aloe Sherie Don, MD Referring Provider is Little Ishikawa*       Chief Complaint  Patient presents with   Aortic Stenosis            HPI:   The patient is a 68 year old gentleman with history of hypertension, hyperlipidemia, prostate cancer status post robotic assisted radical prostatectomy in 2016, and aortic stenosis followed by Dr. Bjorn Pippin.  His most recent echocardiogram on 10/01/2022 showed a trileaflet aortic valve with severe calcification and thickening.  The mean gradient had increased to 47 mmHg with a peak of 80 mmHg and a valve area by VTI of 0.84 cm.  Dimensionless index was 0.22.  Left ventricular ejection fraction was 60 to 65% with moderate concentric LVH and grade 1 diastolic dysfunction.  He was referred to Dr. Clifton James for valve clinic evaluation and underwent cardiac catheterization on 10/19/2022.  This showed mild nonobstructive disease in the LAD and otherwise normal coronary arteries.  Right heart pressures were normal with a PA pressure of 32/14 and a wedge pressure of 8.  The mean gradient across the aortic valve at cath was measured at 28.5 mmHg.  He underwent a gated cardiac CTA showing an aortic valve calcium score of 1702.  The valve was severely thickened and functionally bicuspid with a raphae between the left and right cusps.  There was severe calcification and reduced leaflet excursion.  There was no ascending aortic aneurysm.  He was seen by Dr. Bjorn Pippin on 03/02/2023 and reported onset of shortness of breath with exertion.  He is also reported some vague left-sided chest pressure which he says is present most of the time.  He denies any dizziness or syncope.  He has had no peripheral edema.  He denies orthopnea and PND.   He is here today with his wife.  She underwent meningioma resection last  spring and has undergone 4 surgeries for skull and wound infection with subsequent placement of a titanium mesh plate and intravenous antibiotics.  She just had her PICC line removed and is going to be on chronic suppressive oral antibiotics.  She has had some seizures, most recently a week ago when she was taken off Keppra.  She is now back on Keppra but cannot drive for period of time.  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   Shortness of breath on exertion     Ventral hernia without obstruction or gangrene 03/27/2016   Vitamin D deficiency                 Past Surgical History:  Procedure Laterality Date   Epididymis Excision Left      Spermocele   HEMORROIDECTOMY       HERNIA REPAIR Bilateral 1967 and 1986    x2 open inguinal hernia repairs   INCISION AND DRAINAGE OF WOUND   2017    Wound Infection Post OP   LYMPHADENECTOMY Bilateral 05/17/2014    Procedure: PELVIC LYMPHADENECTOMY;  Surgeon: Heloise Purpura, MD;  Location: [facility];  Service: Urology;  Laterality: Bilateral;   PROSTATE BIOPSY   03/22/14    Heloise Purpura   RIGHT/LEFT HEART CATH AND CORONARY ANGIOGRAPHY N/A 10/19/2022    Procedure: RIGHT/LEFT HEART CATH AND CORONARY ANGIOGRAPHY;  Surgeon: Kathleene Hazel, MD;   Location: [facility];  Service: Cardiovascular;  Laterality: N/A;   ROBOT ASSISTED LAPAROSCOPIC RADICAL PROSTATECTOMY N/A 05/17/2014    Procedure: ROBOTIC ASSISTED LAPAROSCOPIC RADICAL PROSTATECTOMY LEVEL 2;  Surgeon: Heloise Purpura, MD;  Location: [facility];  Service: Urology;  Laterality: N/A;   SKIN TAG REMOVAL        15 lesions   VASECTOMY       VENTRAL HERNIA REPAIR N/A 03/27/2016    Procedure: LAPAROSCOPIC VENTRAL HERNIA;  Surgeon: Ovidio Kin, MD;  Location: [facility];  Service: General;  Laterality: N/A;  WITH MESH               Family History  Problem Relation Age of Onset   Eating disorder Mother     Alcohol abuse Mother     Bipolar disorder Mother     Anxiety disorder Mother     Depression Mother     Kidney disease Mother     Heart disease Mother     Hyperlipidemia Mother     Hypertension Mother     Heart attack Mother     Cancer Mother     COPD Mother     Ovarian cancer Mother     Heart failure Mother     Drug abuse Father     Cancer Father     Heart attack Paternal Grandfather     Heart disease Other     Kidney disease Other     Depression Other     Anxiety disorder Other     Bipolar disorder Other     Alcohol abuse Other     Drug abuse Other     Eating disorder Other            Social History Social History  Social History         Tobacco Use   Smoking status: Never   Smokeless tobacco: Never  Vaping Use   Vaping status: Never Used  Substance Use Topics   Alcohol use: No      Alcohol/week: 0.0 standard drinks of alcohol   Drug use: No              Current Outpatient Medications  Medication Sig Dispense Refill   amLODipine (NORVASC) 5 MG tablet Take 1 tablet (5 mg total) by mouth daily. 90 tablet 3   aspirin EC 81 MG tablet Take 81 mg by mouth daily.  Swallow whole.       Biotin 1000 MCG tablet Take 1,000 mcg by mouth daily.       Boswellia-Glucosamine-Vit D (GLUCOSAMINE COMPLEX -BOSWELLIA PO) Take 1,500 mg by mouth daily.        Cholecalciferol (VITAMIN D-1000 MAX ST) 25 MCG (1000 UT) tablet Take 1,000 Units by mouth daily.       Coenzyme Q10 (CO Q 10) 100 MG CAPS Take by mouth.       ezetimibe (ZETIA) 10 MG tablet TAKE ONE TABLET BY MOUTH ONE TIME DAILY 90 tablet 1   fluticasone (FLONASE) 50 MCG/ACT nasal spray Place 1 spray into both nostrils at bedtime as needed for allergies or rhinitis.       Garlic 1000 MG CAPS Take 1,000 mg by mouth daily.       hydrALAZINE (APRESOLINE) 10 MG tablet Take 1 tablet (10 mg total) by mouth 3 (three) times daily. 270 tablet 3   Multiple Vitamins-Minerals (MULTIVITAMIN WITH MINERALS) tablet Take 1 tablet by mouth daily. Men       olmesartan (BENICAR) 20 MG tablet Take 1 tablet (20 mg total) by mouth daily. 90 tablet 3   Omega-3 Fatty Acids (OMEGA 3 PO) Take 1,500 mg by mouth daily.       rosuvastatin (CRESTOR) 40 MG tablet Take 1 tablet (40 mg total) by mouth daily. 90 tablet 3   vortioxetine HBr (TRINTELLIX) 20 MG TABS tablet Take 1 tablet (20 mg total) by mouth daily. 30 tablet 0   hydrochlorothiazide (HYDRODIURIL) 25 MG tablet Take 1 tablet (25 mg total) by mouth daily. 90 tablet 3      No current facility-administered medications for this visit.        Allergies      Allergies  Allergen Reactions   Amoxicillin-Pot Clavulanate Other (See Comments) and Diarrhea   Topiramate Other (See Comments)        Review of Systems  Constitutional:  Negative for activity change and fatigue.  HENT:  Positive for hearing loss. Negative for dental problem.        Sees his dentist every 6 months.  Eyes: Negative.   Respiratory:  Positive for shortness of breath.   Cardiovascular:  Positive for chest pain. Negative for palpitations and leg swelling.  Gastrointestinal: Negative.   Endocrine: Negative.   Genitourinary: Negative.   Musculoskeletal:  Positive for back pain.  Skin: Negative.   Allergic/Immunologic: Negative.   Neurological:  Negative for dizziness and syncope.   Hematological: Negative.   Psychiatric/Behavioral: Negative.        BP (!) 151/90 (BP Location: Left Arm, Patient Position: Sitting, Cuff Size: Large)   Pulse 65   Resp 20   Ht 5\' 11"  (1.803 m)   Wt 283 lb (128.4 kg)   SpO2 96% Comment: RA  BMI 39.47 kg/m  Physical Exam Constitutional:      Appearance: Normal appearance. He is obese.  HENT:     Head: Normocephalic and atraumatic.  Eyes:     Extraocular Movements: Extraocular movements intact.     Pupils: Pupils are equal, round, and reactive to light.  Neck:     Comments: Bilateral cervical bruit or transmitted murmur. Cardiovascular:     Rate and Rhythm: Normal rate and regular rhythm.     Pulses: Normal pulses.     Heart sounds: Murmur heard.     Comments: 3/6 systolic murmur along the right sternal border.  There is no diastolic murmur. Pulmonary:     Effort:  Pulmonary effort is normal.     Breath sounds: Normal breath sounds.  Abdominal:     Tenderness: There is no abdominal tenderness.  Musculoskeletal:        General: No swelling.     Cervical back: No tenderness.  Skin:    General: Skin is warm and dry.  Neurological:     General: No focal deficit present.     Mental Status: He is alert and oriented to person, place, and time.  Psychiatric:        Mood and Affect: Mood normal.        Behavior: Behavior normal.          Diagnostic Tests:     ECHOCARDIOGRAM REPORT       Patient Name:   Thomas [name] Date of Exam: 10/01/2022  Medical Rec #:  [id number]       Height:       71.0 in  Accession #:    [id number]      Weight:       277.4 lb  Date of Birth:  Dec 07, 1955        BSA:          2.423 m  Patient Age:    67 years        BP:           120/78 mmHg  Patient Gender: M               HR:           62 bpm.  Exam Location:  [address]   Procedure: 2D Echo, Cardiac Doppler and Color Doppler   Indications:    I35.0 AS    History:        Patient has prior history of Echocardiogram examinations,  most                  recent 03/30/2022. AS, Arrythmias:PVC; Risk Factors:Morbid                  obesity, Hypertension and Dyslipidemia.    Sonographer:    Samule Ohm RDCS  Referring Phys: [id number] CHRISTOPHER L SCHUMANN   IMPRESSIONS     1. Left ventricular ejection fraction, by estimation, is 60 to 65%. The  left ventricle has normal function. The left ventricle has no regional  wall motion abnormalities. There is moderate concentric left ventricular  hypertrophy. Left ventricular  diastolic parameters are consistent with Grade I diastolic dysfunction  (impaired relaxation).   2. Right ventricular systolic function is normal. The right ventricular  size is normal. There is normal pulmonary artery systolic pressure. The  estimated right ventricular systolic pressure is 28.6 mmHg.   3. Left atrial size was mild to moderately dilated.   4. The mitral valve is normal in structure. Trivial mitral valve  regurgitation. No evidence of mitral stenosis.   5. The aortic valve is tricuspid. There is severe calcifcation of the  aortic valve. Aortic valve regurgitation is not visualized. Severe aortic  valve stenosis. Aortic valve area, by VTI measures 0.84 cm. Aortic valve  mean gradient measures 47.0 mmHg.   6. The inferior vena cava is normal in size with greater than 50%  respiratory variability, suggesting right atrial pressure of 3 mmHg.   FINDINGS   Left Ventricle: Left ventricular ejection fraction, by estimation, is 60  to 65%. The left ventricle has normal function. The left ventricle has no  regional wall motion abnormalities. The left ventricular  internal cavity  size was normal in size. There is   moderate concentric left ventricular hypertrophy. Left ventricular  diastolic parameters are consistent with Grade I diastolic dysfunction  (impaired relaxation).   Right Ventricle: The right ventricular size is normal. No increase in  right ventricular wall thickness. Right  ventricular systolic function is  normal. There is normal pulmonary artery systolic pressure. The tricuspid  regurgitant velocity is 2.53 m/s, and   with an assumed right atrial pressure of 3 mmHg, the estimated right  ventricular systolic pressure is 28.6 mmHg.   Left Atrium: Left atrial size was mild to moderately dilated.   Right Atrium: Right atrial size was normal in size.   Pericardium: There is no evidence of pericardial effusion.   Mitral Valve: The mitral valve is normal in structure. Mild mitral annular  calcification. Trivial mitral valve regurgitation. No evidence of mitral  valve stenosis.   Tricuspid Valve: The tricuspid valve is normal in structure. Tricuspid  valve regurgitation is trivial.   Aortic Valve: The aortic valve is tricuspid. There is severe calcifcation  of the aortic valve. Aortic valve regurgitation is not visualized. Severe  aortic stenosis is present. Aortic valve mean gradient measures 47.0 mmHg.  Aortic valve peak gradient  measures 80.4 mmHg. Aortic valve area, by VTI measures 0.84 cm.   Pulmonic Valve: The pulmonic valve was normal in structure. Pulmonic valve  regurgitation is trivial.   Aorta: The aortic root is normal in size and structure.   Venous: The inferior vena cava is normal in size with greater than 50%  respiratory variability, suggesting right atrial pressure of 3 mmHg.   IAS/Shunts: No atrial level shunt detected by color flow Doppler.     LEFT VENTRICLE  PLAX 2D  LVIDd:         5.10 cm   Diastology  LVIDs:         3.00 cm   LV e' medial:    9.03 cm/s  LV PW:         1.70 cm   LV E/e' medial:  9.7  LV IVS:        1.60 cm   LV e' lateral:   16.20 cm/s  LVOT diam:     2.20 cm   LV E/e' lateral: 5.4  LV SV:         88  LV SV Index:   36  LVOT Area:     3.80 cm     RIGHT VENTRICLE             IVC  RV S prime:     17.20 cm/s  IVC diam: 1.80 cm  TAPSE (M-mode): 2.1 cm  RVSP:           28.6 mmHg   LEFT ATRIUM             Index        RIGHT ATRIUM           Index  LA diam:      5.00 cm  2.06 cm/m   RA Pressure: 3.00 mmHg  LA Vol (A2C): 80.9 ml  33.39 ml/m  RA Area:     17.10 cm  LA Vol (A4C): 108.0 ml 44.57 ml/m  RA Volume:   43.00 ml  17.75 ml/m   AORTIC VALVE  AV Area (Vmax):    0.86 cm  AV Area (Vmean):   0.86 cm  AV Area (VTI):     0.84 cm  AV  Vmax:           448.33 cm/s  AV Vmean:          315.333 cm/s  AV VTI:            1.040 m  AV Peak Grad:      80.4 mmHg  AV Mean Grad:      47.0 mmHg  LVOT Vmax:         102.00 cm/s  LVOT Vmean:        71.100 cm/s  LVOT VTI:          0.231 m  LVOT/AV VTI ratio: 0.22    AORTA  Ao Root diam: 3.20 cm  Ao Asc diam:  3.40 cm   MITRAL VALVE                TRICUSPID VALVE  MV Area (PHT): 2.74 cm     TR Peak grad:   25.6 mmHg  MV Decel Time: 277 msec     TR Vmax:        253.00 cm/s  MV E velocity: 88.00 cm/s   Estimated RAP:  3.00 mmHg  MV A velocity: 101.00 cm/s  RVSP:           28.6 mmHg  MV E/A ratio:  0.87                              SHUNTS                              Systemic VTI:  0.23 m                              Systemic Diam: 2.20 cm   Dalton McleanMD  Electronically signed by Wilfred Lacy  Signature Date/Time: 10/01/2022/2:49:29 PM        Final        icians   Panel Physicians Referring Physician Case Authorizing Physician  Kathleene Hazel, MD (Primary)        Procedures   RIGHT/LEFT HEART CATH AND CORONARY ANGIOGRAPHY    Conclusion       Mid LAD lesion is 20% stenosed.   Mild non-obstructive CAD Normal right and left heart pressures (RA 5, RV 30/5/9, PA 32/14 mean 22, PCWP 8) Severe aortic stenosis by echo (mean gradient by cath is 28.5 mmHg).    Recommendations: Continue planning for SAVR vs TAVR. Medical management of mild CAD   Indications   Severe aortic stenosis [I35.0 (ICD-10-CM)]    Procedural Details   Technical Details Indication: 68 yo male with severe aortic stenosis  Procedure: The  risks, benefits, complications, treatment options, and expected outcomes were discussed with the patient. The patient and/or family concurred with the proposed plan, giving informed consent. The patient was sedated with Versed and Fentanyl. The IV catheter present in the right antecubital vein was changed for a 5 [place] sheath. Right heart catheterization performed with a balloon tipped catheter. The right wrist was prepped and draped in a sterile fashion. 1% lidocaine was used for local anesthesia. Using the modified Seldinger access technique, a 5 French sheath was placed in the right radial artery. 3 mg Verapamil was given through the sheath. Weight based IV heparin was given. Standard diagnostic catheters were used to perform selective coronary angiography. I engaged the left main  with a 5 Fr EBU 3.5 guiding catheter. LV pressures measured with the JR4 catheter. All catheter exchanges were performed over an exchange length guidewire.   The sheath was removed from the right radial artery and a hemostasis band was applied at the arteriotomy site on the right wrist.      Estimated blood loss <50 mL.   During this procedure medications were administered to achieve and maintain moderate conscious sedation while the patient's heart rate, blood pressure, and oxygen saturation were continuously monitored and I was present face-to-face 100% of this time.    Medications (Filter: Administrations occurring from 1005 to 1118 on 10/19/22) fentaNYL (SUBLIMAZE) injection (mcg)  Total dose: 25 mcg Date/Time Rate/Dose/Volume Action    10/19/22 1029 25 mcg Given    midazolam (VERSED) injection (mg)  Total dose: 1 mg Date/Time Rate/Dose/Volume Action    10/19/22 1029 1 mg Given    Heparin (Porcine) in NaCl 1000-0.9 UT/500ML-% SOLN (mL)  Total volume: 1,000 mL Date/Time Rate/Dose/Volume Action    10/19/22 1029 500 mL Given    1029 500 mL Given    lidocaine (PF) (XYLOCAINE) 1 % injection (mL)  Total volume:  2 mL Date/Time Rate/Dose/Volume Action    10/19/22 1037 2 mL Given    heparin sodium (porcine) injection (Units)  Total dose: 6,000 Units Date/Time Rate/Dose/Volume Action    10/19/22 1051 6,000 Units Given    Radial Cocktail/Verapamil only (mL)  Total volume: 10 mL Date/Time Rate/Dose/Volume Action    10/19/22 1039 10 mL Given      Sedation Time   Sedation Time Physician-1: 33 minutes 57 seconds Radiation/Fluoro   Fluoro time: 6.5 (min) DAP: 40532 (mGycm2) Cumulative Air Kerma: 590 (mGy) Complications   Complications documented before study signed (10/19/2022 11:24 AM)    No complications were associated with this study.  Documented by Loma Messing B - 10/19/2022 11:13 AM      Coronary Findings   Diagnostic Dominance: Left Left Anterior Descending  Vessel is large.  Mid LAD lesion is 20% stenosed.    Left Circumflex  Vessel is large.    Right Coronary Artery  Vessel is moderate in size.    Intervention    No interventions have been documented.    Coronary Diagrams   Diagnostic Dominance: Left  Intervention    Implants    No implant documentation for this case.    Syngo Images    Show images for CARDIAC CATHETERIZATION Images on Long Term Storage    Show images for Humza, [place] to Procedure Log   Procedure Log    Hemo Data   Flowsheet Row Most Recent Value  Fick Cardiac Output 7.19 L/min  Fick Cardiac Output Index 2.97 (L/min)/BSA  Aortic Mean Gradient 28.54 mmHg  Aortic Peak Gradient 27.6 mmHg  Aortic Valve Area 1.56  Aortic Value Area Index 0.64 cm2/BSA  RA A Wave 7 mmHg  RA V Wave 6 mmHg  RA Mean 5 mmHg  RV Systolic Pressure 30 mmHg  RV Diastolic Pressure 5 mmHg  RV EDP 9 mmHg  PA Systolic Pressure 32 mmHg  PA Diastolic Pressure 14 mmHg  PA Mean 22 mmHg  PW A Wave 16 mmHg  PW V Wave 14 mmHg  PW Mean 8 mmHg  AO Systolic Pressure 128 mmHg  AO Diastolic Pressure 65 mmHg  AO Mean 89 mmHg  LV Systolic Pressure 164 mmHg   LV Diastolic Pressure -2 mmHg  LV EDP 9 mmHg  Arterial Occlusion Pressure Extended Systolic  Pressure 122 mmHg  Arterial Occlusion Pressure Extended Diastolic Pressure 60 mmHg  Arterial Occlusion Pressure Extended Mean Pressure 85 mmHg  Left Ventricular Apex Extended Systolic Pressure 147 mmHg  LVp Diastolic Pressure -10 mmHg  Left Ventricular Apex Extended EDP Pressure 8 mmHg  QP/QS 1  TPVR Index 7.42 HRUI  TSVR Index 30.01 HRUI  PVR SVR Ratio 0.17  TPVR/TSVR Ratio 0.25      ADDENDUM REPORT: 10/27/2022 15:00   CLINICAL DATA:  Aortic Valve pathology with assessment for TAVR   EXAM: Cardiac TAVR CT   TECHNIQUE: The patient was scanned on a Siemens Force 192 slice scanner. A 120 kV retrospective scan was triggered in the descending thoracic aorta at 111 HU's. Gantry rotation speed was 270 msecs and collimation was .9 mm. No beta blockade or nitro were given. The 3D data set was reconstructed in 5% intervals of the R-R cycle. Systolic and diastolic phases were analyzed on a dedicated work station using MPR, MIP and VRT modes. The patient received 100 cc of contrast.   FINDINGS: Aortic Valve: Severely thickened aortic valve, functionally bicuspid with a small left-right raphe with calcification and reduced excursion   Annular calcification: None   Aortic Valve Calcium Score: 1702   Presence of basal septal hypertrophy: No   Perimembranous septal diameter: 8 mm   Mitral Valve: No calcifications.   Aortic Annulus Measurements- 30% phase   Major annulus diameter: 28 mm   Minor annulus diameter: 23 mm   Annular perimeter: 80 mm   Annular area: 4.69 cm2   Aortic Root Measurements- 74%   Sinotubular Junction: 31 mm   Ascending Thoracic Aorta: 34 mm   Aortic Arch: 32 mm   Descending Thoracic Aorta: 26 mm   Aortic atherosclerosis.   Sinus of Valsalva Measurements:   Right coronary cusp width: 29 mm   Left coronary cusp width: 30 mm   Non coronary cusp  width: 31 mm   Coronary Artery Height above Annulus:   Left Main: 14 mm   Left SoV height: 22 mm   Right Coronary: 15 mm   Right SoV height: 22 mm   Optimum Fluoroscopic Angle for Delivery: LAO 7, CAU 6   Cusp overlay view angle: RAO 0, CAU 15   Valves for structural team consideration: 26 mm Sapien vs a 29 mm Evolut Valve   Non TAVR Valve Findings:   Coronary Arteries: Normal coronary origin- very short left main. Study not completed with nitroglycerin.   Coronary Calcium Score:   Left main: 0   Left anterior descending artery: 112   Left circumflex artery: 222   Right coronary artery:0   Total: 334   Percentile: 74th for age, sex, and race matched control.   Systemic veins: Normal anatomy   Main Pulmonary artery: Normal caliber   Pulmonary veins: Normal variant anatomy, right middle pulmonary vein   Left atrial appendage: Patent   Interatrial septum: No clear communications   Left ventricle: Normal size   Right ventricle: Normal size   Left atrium: Dilated   Right atrium: Dilated   Pericardium: No calcifications   Extra Cardiac Findings as per separate reporting.   Notable artifacts: Mild body attenuation, systolic phase motion artifact   IMPRESSION: 1. Moderate to severe aortic stenosis. Findings pertinent to TAVR procedure are detailed above.   RECOMMENDATIONS:   The proposed cut-off value of 1,651 AU yielded a 93 % sensitivity and 75 % specificity in grading AS severity in patients with classical low-flow, low-gradient AS.  Proposed different cut-off values to define severe AS for men and women as 2,065 AU and 1,274 AU, respectively. The joint European and American recommendations for the assessment of AS consider the aortic valve calcium score as a continuum - a very high calcium score suggests severe AS and a low calcium score suggests severe AS is unlikely.   Sunday Shams, et al. 2017 ESC/EACTS Guidelines for the  management of valvular heart disease. Eur Heart J [phone]   Coronary artery calcium (CAC) score is a strong predictor of incident coronary heart disease (CHD) and provides predictive information beyond traditional risk factors. CAC scoring is reasonable to use in the decision to withhold, postpone, or initiate statin therapy in intermediate-risk or selected borderline-risk asymptomatic adults (age 8-75 years and LDL-C >=70 to <190 mg/dL) who do not have diabetes or established atherosclerotic cardiovascular disease (ASCVD).* In intermediate-risk (10-year ASCVD risk >=7.5% to <20%) adults or selected borderline-risk (10-year ASCVD risk >=5% to <7.5%) adults in whom a CAC score is measured for the purpose of making a treatment decision the following recommendations have been made:   If CAC = 0, it is reasonable to withhold statin therapy and reassess in 5 to 10 years, as long as higher risk conditions are absent (diabetes mellitus, family history of premature CHD in first degree relatives (males <55 years; females <65 years), cigarette smoking, LDL >=190 mg/dL or other independent risk factors).   If CAC is 1 to 99, it is reasonable to initiate statin therapy for patients >=37 years of age.   If CAC is >=100 or >=75th percentile, it is reasonable to initiate statin therapy at any age.   Cardiology referral should be considered for patients with CAC scores >=400 or >=75th percentile.   *2018 AHA/ACC/AACVPR/AAPA/ABC/ACPM/ADA/AGS/APhA/ASPC/NLA/PCNA Guideline on the Management of Blood Cholesterol: A Report of the American College of Cardiology/American Heart Association Task Force on Clinical Practice Guidelines. J Am Coll Cardiol. 2019;73(24):3168-3209.   Mahesh  Chandrasekhar     Electronically Signed   By: Riley Lam M.D.   On: 10/27/2022 15:00     Impression:   This 68 year old gentleman has stage D, severe, symptomatic aortic stenosis with NYHA  class II symptoms of exertional shortness of breath consistent with chronic diastolic congestive heart failure.  I have personally reviewed his 2D echocardiogram, cardiac catheterization, and gated cardiac CTA.  His echo shows a heavily calcified and thickened aortic valve with restricted leaflet mobility.  The mean gradient is 47 mmHg with a valve area of 0.8 cm consistent with severe aortic stenosis.  Left ventricular systolic function is normal.  His catheterization shows mild nonobstructive coronary disease with normal right heart pressures.  His gated cardiac CTA shows a functionally bicuspid aortic valve that is severely thickened with restricted leaflet mobility.  The calcium score was 1702 consistent with severe aortic stenosis.  I agree that aortic valve replacement is indicated in this patient for relief of his symptoms and to prevent progressive left ventricular dysfunction.  Given his age of 43 with a heavily calcified bicuspid aortic valve I think that open surgical aortic valve replacement would be the best treatment option for him.  I reviewed the echo, cath, and CT images with him and his wife and answered all their questions.  We discussed the pros and cons of mechanical and bioprosthetic prostheses and my recommendation for a bovine pericardial bioprosthesis.  He is in agreement with that. I discussed the operative procedure with the patient and his  wife including alternatives, benefits and risks; including but not limited to bleeding, blood transfusion, infection, stroke, myocardial infarction, graft failure, heart block requiring a permanent pacemaker, organ dysfunction, and death.  [name] understands and agrees to proceed.     Plan:   Aortic valve replacement using a bioprosthetic valve.    Alleen Borne, MD [facility] [phone]

## 2023-05-11 ENCOUNTER — Inpatient Hospital Stay (HOSPITAL_COMMUNITY): Payer: Medicare Other

## 2023-05-11 ENCOUNTER — Encounter (HOSPITAL_COMMUNITY)
Admission: RE | Disposition: A | Discharge status: Home Health | Payer: Self-pay | Source: Home / Self Care | Attending: Surgery

## 2023-05-11 ENCOUNTER — Other Ambulatory Visit: Payer: Self-pay

## 2023-05-11 ENCOUNTER — Encounter (HOSPITAL_COMMUNITY): Payer: Self-pay | Admitting: Surgery

## 2023-05-11 ENCOUNTER — Inpatient Hospital Stay (HOSPITAL_COMMUNITY)
Admission: RE | Admit: 2023-05-11 | Discharge: 2023-05-16 | DRG: 220 | Disposition: A | Discharge status: Home Health | Payer: Medicare Other | Attending: Surgery | Admitting: Surgery

## 2023-05-11 ENCOUNTER — Inpatient Hospital Stay (HOSPITAL_COMMUNITY): Payer: Self-pay | Admitting: Physician Assistant

## 2023-05-11 ENCOUNTER — Inpatient Hospital Stay (HOSPITAL_COMMUNITY): Payer: Self-pay

## 2023-05-11 DIAGNOSIS — I2510 Atherosclerotic heart disease of native coronary artery without angina pectoris: Secondary | ICD-10-CM | POA: Diagnosis present

## 2023-05-11 DIAGNOSIS — Z8249 Family history of ischemic heart disease and other diseases of the circulatory system: Secondary | ICD-10-CM | POA: Diagnosis not present

## 2023-05-11 DIAGNOSIS — Z8546 Personal history of malignant neoplasm of prostate: Secondary | ICD-10-CM

## 2023-05-11 DIAGNOSIS — E669 Obesity, unspecified: Secondary | ICD-10-CM | POA: Diagnosis present

## 2023-05-11 DIAGNOSIS — Z880 Allergy status to penicillin: Secondary | ICD-10-CM | POA: Diagnosis not present

## 2023-05-11 DIAGNOSIS — I110 Hypertensive heart disease with heart failure: Secondary | ICD-10-CM | POA: Diagnosis present

## 2023-05-11 DIAGNOSIS — Z86011 Personal history of benign neoplasm of the brain: Secondary | ICD-10-CM

## 2023-05-11 DIAGNOSIS — I5032 Chronic diastolic (congestive) heart failure: Secondary | ICD-10-CM | POA: Diagnosis present

## 2023-05-11 DIAGNOSIS — F419 Anxiety disorder, unspecified: Secondary | ICD-10-CM | POA: Diagnosis present

## 2023-05-11 DIAGNOSIS — Z9079 Acquired absence of other genital organ(s): Secondary | ICD-10-CM

## 2023-05-11 DIAGNOSIS — Z7982 Long term (current) use of aspirin: Secondary | ICD-10-CM

## 2023-05-11 DIAGNOSIS — E785 Hyperlipidemia, unspecified: Secondary | ICD-10-CM | POA: Diagnosis present

## 2023-05-11 DIAGNOSIS — Z818 Family history of other mental and behavioral disorders: Secondary | ICD-10-CM | POA: Diagnosis not present

## 2023-05-11 DIAGNOSIS — Z83438 Family history of other disorder of lipoprotein metabolism and other lipidemia: Secondary | ICD-10-CM

## 2023-05-11 DIAGNOSIS — I10 Essential (primary) hypertension: Secondary | ICD-10-CM

## 2023-05-11 DIAGNOSIS — K589 Irritable bowel syndrome without diarrhea: Secondary | ICD-10-CM | POA: Diagnosis present

## 2023-05-11 DIAGNOSIS — Z6838 Body mass index (BMI) 38.0-38.9, adult: Secondary | ICD-10-CM

## 2023-05-11 DIAGNOSIS — Z952 Presence of prosthetic heart valve: Secondary | ICD-10-CM

## 2023-05-11 DIAGNOSIS — I350 Nonrheumatic aortic (valve) stenosis: Secondary | ICD-10-CM | POA: Diagnosis present

## 2023-05-11 DIAGNOSIS — E559 Vitamin D deficiency, unspecified: Secondary | ICD-10-CM | POA: Diagnosis present

## 2023-05-11 DIAGNOSIS — Z79899 Other long term (current) drug therapy: Secondary | ICD-10-CM | POA: Diagnosis not present

## 2023-05-11 DIAGNOSIS — Z888 Allergy status to other drugs, medicaments and biological substances status: Secondary | ICD-10-CM

## 2023-05-11 DIAGNOSIS — D6959 Other secondary thrombocytopenia: Secondary | ICD-10-CM | POA: Diagnosis not present

## 2023-05-11 DIAGNOSIS — Q2381 Bicuspid aortic valve: Secondary | ICD-10-CM

## 2023-05-11 DIAGNOSIS — M1990 Unspecified osteoarthritis, unspecified site: Secondary | ICD-10-CM | POA: Diagnosis present

## 2023-05-11 DIAGNOSIS — R569 Unspecified convulsions: Secondary | ICD-10-CM | POA: Diagnosis present

## 2023-05-11 HISTORY — PX: AORTIC VALVE REPLACEMENT: SHX41

## 2023-05-11 HISTORY — PX: TEE WITHOUT CARDIOVERSION: SHX5443

## 2023-05-11 LAB — POCT I-STAT, CHEM 8
BUN: 21 mg/dL (ref 8–23)
BUN: 20 mg/dL (ref 8–23)
BUN: 20 mg/dL (ref 8–23)
BUN: 20 mg/dL (ref 8–23)
BUN: 21 mg/dL (ref 8–23)
BUN: 23 mg/dL (ref 8–23)
Calcium, Ion: 1.29 mmol/L (ref 1.15–1.40)
Calcium, Ion: 1.25 mmol/L (ref 1.15–1.40)
Calcium, Ion: 1.16 mmol/L (ref 1.15–1.40)
Calcium, Ion: 1.13 mmol/L — ABNORMAL LOW (ref 1.15–1.40)
Calcium, Ion: 1.15 mmol/L (ref 1.15–1.40)
Calcium, Ion: 1.18 mmol/L (ref 1.15–1.40)
Chloride: 102 mmol/L (ref 98–111)
Chloride: 102 mmol/L (ref 98–111)
Chloride: 103 mmol/L (ref 98–111)
Chloride: 104 mmol/L (ref 98–111)
Chloride: 101 mmol/L (ref 98–111)
Chloride: 102 mmol/L (ref 98–111)
Creatinine, Ser: 0.8 mg/dL (ref 0.61–1.24)
Creatinine, Ser: 0.9 mg/dL (ref 0.61–1.24)
Creatinine, Ser: 1 mg/dL (ref 0.61–1.24)
Creatinine, Ser: 0.9 mg/dL (ref 0.61–1.24)
Creatinine, Ser: 1 mg/dL (ref 0.61–1.24)
Creatinine, Ser: 1 mg/dL (ref 0.61–1.24)
Glucose, Bld: 119 mg/dL — ABNORMAL HIGH (ref 70–99)
Glucose, Bld: 118 mg/dL — ABNORMAL HIGH (ref 70–99)
Glucose, Bld: 144 mg/dL — ABNORMAL HIGH (ref 70–99)
Glucose, Bld: 157 mg/dL — ABNORMAL HIGH (ref 70–99)
Glucose, Bld: 143 mg/dL — ABNORMAL HIGH (ref 70–99)
Glucose, Bld: 145 mg/dL — ABNORMAL HIGH (ref 70–99)
HCT: 42 % (ref 39.0–52.0)
HCT: 39 % (ref 39.0–52.0)
HCT: 33 % — ABNORMAL LOW (ref 39.0–52.0)
HCT: 34 % — ABNORMAL LOW (ref 39.0–52.0)
HCT: 33 % — ABNORMAL LOW (ref 39.0–52.0)
HCT: 33 % — ABNORMAL LOW (ref 39.0–52.0)
Hemoglobin: 14.3 g/dL (ref 13.0–17.0)
Hemoglobin: 13.3 g/dL (ref 13.0–17.0)
Hemoglobin: 11.2 g/dL — ABNORMAL LOW (ref 13.0–17.0)
Hemoglobin: 11.6 g/dL — ABNORMAL LOW (ref 13.0–17.0)
Hemoglobin: 11.2 g/dL — ABNORMAL LOW (ref 13.0–17.0)
Hemoglobin: 11.2 g/dL — ABNORMAL LOW (ref 13.0–17.0)
Potassium: 3.8 mmol/L (ref 3.5–5.1)
Potassium: 4 mmol/L (ref 3.5–5.1)
Potassium: 4.9 mmol/L (ref 3.5–5.1)
Potassium: 5 mmol/L (ref 3.5–5.1)
Potassium: 4.3 mmol/L (ref 3.5–5.1)
Potassium: 4.9 mmol/L (ref 3.5–5.1)
Sodium: 138 mmol/L (ref 135–145)
Sodium: 138 mmol/L (ref 135–145)
Sodium: 137 mmol/L (ref 135–145)
Sodium: 137 mmol/L (ref 135–145)
Sodium: 137 mmol/L (ref 135–145)
Sodium: 139 mmol/L (ref 135–145)
TCO2: 23 mmol/L (ref 22–32)
TCO2: 24 mmol/L (ref 22–32)
TCO2: 26 mmol/L (ref 22–32)
TCO2: 26 mmol/L (ref 22–32)
TCO2: 23 mmol/L (ref 22–32)
TCO2: 25 mmol/L (ref 22–32)

## 2023-05-11 LAB — POCT I-STAT 7, (LYTES, BLD GAS, ICA,H+H)
Acid-Base Excess: 0 mmol/L (ref 0.0–2.0)
Acid-Base Excess: 0 mmol/L (ref 0.0–2.0)
Acid-base deficit: 1 mmol/L (ref 0.0–2.0)
Acid-base deficit: 1 mmol/L (ref 0.0–2.0)
Acid-base deficit: 2 mmol/L (ref 0.0–2.0)
Acid-base deficit: 3 mmol/L — ABNORMAL HIGH (ref 0.0–2.0)
Acid-base deficit: 3 mmol/L — ABNORMAL HIGH (ref 0.0–2.0)
Acid-base deficit: 2 mmol/L (ref 0.0–2.0)
Bicarbonate: 25 mmol/L (ref 20.0–28.0)
Bicarbonate: 24.6 mmol/L (ref 20.0–28.0)
Bicarbonate: 24.7 mmol/L (ref 20.0–28.0)
Bicarbonate: 23.8 mmol/L (ref 20.0–28.0)
Bicarbonate: 24.6 mmol/L (ref 20.0–28.0)
Bicarbonate: 23.3 mmol/L (ref 20.0–28.0)
Bicarbonate: 23.5 mmol/L (ref 20.0–28.0)
Bicarbonate: 23.6 mmol/L (ref 20.0–28.0)
Calcium, Ion: 1.3 mmol/L (ref 1.15–1.40)
Calcium, Ion: 1.08 mmol/L — ABNORMAL LOW (ref 1.15–1.40)
Calcium, Ion: 1.18 mmol/L (ref 1.15–1.40)
Calcium, Ion: 1.16 mmol/L (ref 1.15–1.40)
Calcium, Ion: 1.18 mmol/L (ref 1.15–1.40)
Calcium, Ion: 1.16 mmol/L (ref 1.15–1.40)
Calcium, Ion: 1.17 mmol/L (ref 1.15–1.40)
Calcium, Ion: 1.14 mmol/L — ABNORMAL LOW (ref 1.15–1.40)
HCT: 42 % (ref 39.0–52.0)
HCT: 31 % — ABNORMAL LOW (ref 39.0–52.0)
HCT: 34 % — ABNORMAL LOW (ref 39.0–52.0)
HCT: 33 % — ABNORMAL LOW (ref 39.0–52.0)
HCT: 33 % — ABNORMAL LOW (ref 39.0–52.0)
HCT: 34 % — ABNORMAL LOW (ref 39.0–52.0)
HCT: 30 % — ABNORMAL LOW (ref 39.0–52.0)
HCT: 30 % — ABNORMAL LOW (ref 39.0–52.0)
Hemoglobin: 14.3 g/dL (ref 13.0–17.0)
Hemoglobin: 10.5 g/dL — ABNORMAL LOW (ref 13.0–17.0)
Hemoglobin: 11.6 g/dL — ABNORMAL LOW (ref 13.0–17.0)
Hemoglobin: 11.2 g/dL — ABNORMAL LOW (ref 13.0–17.0)
Hemoglobin: 11.2 g/dL — ABNORMAL LOW (ref 13.0–17.0)
Hemoglobin: 11.6 g/dL — ABNORMAL LOW (ref 13.0–17.0)
Hemoglobin: 10.2 g/dL — ABNORMAL LOW (ref 13.0–17.0)
Hemoglobin: 10.2 g/dL — ABNORMAL LOW (ref 13.0–17.0)
O2 Saturation: 99 %
O2 Saturation: 100 %
O2 Saturation: 100 %
O2 Saturation: 100 %
O2 Saturation: 91 %
O2 Saturation: 96 %
O2 Saturation: 96 %
O2 Saturation: 94 %
Patient temperature: 37.1
Patient temperature: 37.1
Patient temperature: 37
Potassium: 3.8 mmol/L (ref 3.5–5.1)
Potassium: 4.3 mmol/L (ref 3.5–5.1)
Potassium: 4.6 mmol/L (ref 3.5–5.1)
Potassium: 4.3 mmol/L (ref 3.5–5.1)
Potassium: 4.9 mmol/L (ref 3.5–5.1)
Potassium: 4.7 mmol/L (ref 3.5–5.1)
Potassium: 4.5 mmol/L (ref 3.5–5.1)
Potassium: 4.5 mmol/L (ref 3.5–5.1)
Sodium: 139 mmol/L (ref 135–145)
Sodium: 139 mmol/L (ref 135–145)
Sodium: 138 mmol/L (ref 135–145)
Sodium: 137 mmol/L (ref 135–145)
Sodium: 138 mmol/L (ref 135–145)
Sodium: 139 mmol/L (ref 135–145)
Sodium: 139 mmol/L (ref 135–145)
Sodium: 141 mmol/L (ref 135–145)
TCO2: 26 mmol/L (ref 22–32)
TCO2: 26 mmol/L (ref 22–32)
TCO2: 26 mmol/L (ref 22–32)
TCO2: 25 mmol/L (ref 22–32)
TCO2: 26 mmol/L (ref 22–32)
TCO2: 25 mmol/L (ref 22–32)
TCO2: 25 mmol/L (ref 22–32)
TCO2: 25 mmol/L (ref 22–32)
pCO2 arterial: 44 mmHg (ref 32–48)
pCO2 arterial: 39.2 mmHg (ref 32–48)
pCO2 arterial: 43 mmHg (ref 32–48)
pCO2 arterial: 40.6 mmHg (ref 32–48)
pCO2 arterial: 43.3 mmHg (ref 32–48)
pCO2 arterial: 45.7 mmHg (ref 32–48)
pCO2 arterial: 46.3 mmHg (ref 32–48)
pCO2 arterial: 44.8 mmHg (ref 32–48)
pH, Arterial: 7.362 (ref 7.35–7.45)
pH, Arterial: 7.406 (ref 7.35–7.45)
pH, Arterial: 7.366 (ref 7.35–7.45)
pH, Arterial: 7.348 — ABNORMAL LOW (ref 7.35–7.45)
pH, Arterial: 7.391 (ref 7.35–7.45)
pH, Arterial: 7.316 — ABNORMAL LOW (ref 7.35–7.45)
pH, Arterial: 7.314 — ABNORMAL LOW (ref 7.35–7.45)
pH, Arterial: 7.33 — ABNORMAL LOW (ref 7.35–7.45)
pO2, Arterial: 135 mmHg — ABNORMAL HIGH (ref 83–108)
pO2, Arterial: 316 mmHg — ABNORMAL HIGH (ref 83–108)
pO2, Arterial: 304 mmHg — ABNORMAL HIGH (ref 83–108)
pO2, Arterial: 250 mmHg — ABNORMAL HIGH (ref 83–108)
pO2, Arterial: 65 mmHg — ABNORMAL LOW (ref 83–108)
pO2, Arterial: 87 mmHg (ref 83–108)
pO2, Arterial: 88 mmHg (ref 83–108)
pO2, Arterial: 76 mmHg — ABNORMAL LOW (ref 83–108)

## 2023-05-11 LAB — PROTIME-INR
INR: 1.4 — ABNORMAL HIGH (ref 0.8–1.2)
Prothrombin Time: 17.6 s — ABNORMAL HIGH (ref 11.4–15.2)

## 2023-05-11 LAB — BASIC METABOLIC PANEL
Anion gap: 8 (ref 5–15)
BUN: 19 mg/dL (ref 8–23)
CO2: 23 mmol/L (ref 22–32)
Calcium: 8.1 mg/dL — ABNORMAL LOW (ref 8.9–10.3)
Chloride: 107 mmol/L (ref 98–111)
Creatinine, Ser: 1.06 mg/dL (ref 0.61–1.24)
GFR, Estimated: >60 mL/min (ref >60)
Glucose, Bld: 164 mg/dL — ABNORMAL HIGH (ref 70–99)
Potassium: 4.2 mmol/L (ref 3.5–5.1)
Sodium: 138 mmol/L (ref 135–145)

## 2023-05-11 LAB — PLATELET COUNT: Platelets: 114 10*3/uL — ABNORMAL LOW (ref 150–400)

## 2023-05-11 LAB — POCT I-STAT EG7
Acid-base deficit: 1 mmol/L (ref 0.0–2.0)
Bicarbonate: 24.1 mmol/L (ref 20.0–28.0)
Calcium, Ion: 1.11 mmol/L — ABNORMAL LOW (ref 1.15–1.40)
HCT: 32 % — ABNORMAL LOW (ref 39.0–52.0)
Hemoglobin: 10.9 g/dL — ABNORMAL LOW (ref 13.0–17.0)
O2 Saturation: 76 %
Potassium: 4.5 mmol/L (ref 3.5–5.1)
Sodium: 138 mmol/L (ref 135–145)
TCO2: 25 mmol/L (ref 22–32)
pCO2, Ven: 43.1 mmHg — ABNORMAL LOW (ref 44–60)
pH, Ven: 7.356 (ref 7.25–7.43)
pO2, Ven: 43 mmHg (ref 32–45)

## 2023-05-11 LAB — CBC
HCT: 35.9 % — ABNORMAL LOW (ref 39.0–52.0)
HCT: 30.3 % — ABNORMAL LOW (ref 39.0–52.0)
Hemoglobin: 12.2 g/dL — ABNORMAL LOW (ref 13.0–17.0)
Hemoglobin: 10.2 g/dL — ABNORMAL LOW (ref 13.0–17.0)
MCH: 30 pg (ref 26.0–34.0)
MCH: 29.9 pg (ref 26.0–34.0)
MCHC: 34 g/dL (ref 30.0–36.0)
MCHC: 33.7 g/dL (ref 30.0–36.0)
MCV: 88.2 fL (ref 80.0–100.0)
MCV: 88.9 fL (ref 80.0–100.0)
Platelets: 113 10*3/uL — ABNORMAL LOW (ref 150–400)
Platelets: 98 10*3/uL — ABNORMAL LOW (ref 150–400)
RBC: 4.07 MIL/uL — ABNORMAL LOW (ref 4.22–5.81)
RBC: 3.41 MIL/uL — ABNORMAL LOW (ref 4.22–5.81)
RDW: 13.2 % (ref 11.5–15.5)
RDW: 13.3 % (ref 11.5–15.5)
WBC: 8.3 10*3/uL (ref 4.0–10.5)
WBC: 8.3 10*3/uL (ref 4.0–10.5)
nRBC: 0 % (ref 0.0–0.2)
nRBC: 0 % (ref 0.0–0.2)

## 2023-05-11 LAB — GLUCOSE, CAPILLARY
Glucose-Capillary: 121 mg/dL — ABNORMAL HIGH (ref 70–99)
Glucose-Capillary: 145 mg/dL — ABNORMAL HIGH (ref 70–99)
Glucose-Capillary: 132 mg/dL — ABNORMAL HIGH (ref 70–99)
Glucose-Capillary: 137 mg/dL — ABNORMAL HIGH (ref 70–99)
Glucose-Capillary: 161 mg/dL — ABNORMAL HIGH (ref 70–99)
Glucose-Capillary: 134 mg/dL — ABNORMAL HIGH (ref 70–99)
Glucose-Capillary: 139 mg/dL — ABNORMAL HIGH (ref 70–99)

## 2023-05-11 LAB — HEMOGLOBIN AND HEMATOCRIT, BLOOD
HCT: 34.5 % — ABNORMAL LOW (ref 39.0–52.0)
Hemoglobin: 11.9 g/dL — ABNORMAL LOW (ref 13.0–17.0)

## 2023-05-11 LAB — FIBRINOGEN: Fibrinogen: 212 mg/dL (ref 210–475)

## 2023-05-11 LAB — MAGNESIUM: Magnesium: 3 mg/dL — ABNORMAL HIGH (ref 1.7–2.4)

## 2023-05-11 LAB — ECHO INTRAOPERATIVE TEE
Height: 71 in
Weight: 4448 [oz_av]

## 2023-05-11 LAB — APTT: aPTT: 42 s — ABNORMAL HIGH (ref 24–36)

## 2023-05-11 SURGERY — REPLACEMENT, AORTIC VALVE, OPEN
Anesthesia: General | Site: Chest

## 2023-05-11 MED ORDER — CHLORHEXIDINE GLUCONATE 4 % EX SOLN: 30.0000 mL | CUTANEOUS | Status: DC

## 2023-05-11 MED ORDER — SODIUM CHLORIDE 0.9 % IV SOLN: INTRAVENOUS | Status: DC | PRN

## 2023-05-11 MED ORDER — SODIUM CHLORIDE 0.9 % IV SOLN: 250.0000 mL | INTRAVENOUS | Status: AC

## 2023-05-11 MED ORDER — PROPOFOL 500 MG/50ML IV EMUL
INTRAVENOUS | Status: DC | PRN
  Administered 2023-05-11: 50 ug/kg/min via INTRAVENOUS

## 2023-05-11 MED ORDER — PROPOFOL 10 MG/ML IV BOLUS
INTRAVENOUS | Status: AC
  Filled 2023-05-11: qty 20

## 2023-05-11 MED ORDER — METHADONE HCL IV SYRINGE 10 MG/ML FOR CABG
20.0000 mg | Freq: Once | INTRAMUSCULAR | Status: AC
  Administered 2023-05-11: 20 mg via INTRAVENOUS
  Filled 2023-05-11: qty 2

## 2023-05-11 MED ORDER — ASPIRIN 325 MG PO TBEC: 325.0000 mg | DELAYED_RELEASE_TABLET | Freq: Every day | ORAL | Status: DC

## 2023-05-11 MED ORDER — ROCURONIUM BROMIDE 10 MG/ML (PF) SYRINGE
PREFILLED_SYRINGE | INTRAVENOUS | Status: AC
  Filled 2023-05-11: qty 10

## 2023-05-11 MED ORDER — ROCURONIUM BROMIDE 10 MG/ML (PF) SYRINGE
PREFILLED_SYRINGE | INTRAVENOUS | Status: DC | PRN
  Administered 2023-05-11: 60 mg via INTRAVENOUS
  Administered 2023-05-11: 100 mg via INTRAVENOUS
  Administered 2023-05-11: 40 mg via INTRAVENOUS
  Administered 2023-05-11: 10 mg via INTRAVENOUS
  Administered 2023-05-11: 100 mg via INTRAVENOUS

## 2023-05-11 MED ORDER — HEPARIN SODIUM (PORCINE) 1000 UNIT/ML IJ SOLN
INTRAMUSCULAR | Status: DC | PRN
  Administered 2023-05-11: 44000 [IU] via INTRAVENOUS

## 2023-05-11 MED ORDER — METOPROLOL TARTRATE 12.5 MG HALF TABLET: 12.5000 mg | ORAL_TABLET | Freq: Two times a day (BID) | ORAL | Status: DC

## 2023-05-11 MED ORDER — SODIUM CHLORIDE 0.9% FLUSH: 3.0000 mL | INTRAVENOUS | Status: DC | PRN

## 2023-05-11 MED ORDER — LACTATED RINGERS IV SOLN: INTRAVENOUS | Status: AC

## 2023-05-11 MED ORDER — SUGAMMADEX SODIUM 200 MG/2ML IV SOLN
INTRAVENOUS | Status: DC | PRN
  Administered 2023-05-11: 350 mg via INTRAVENOUS

## 2023-05-11 MED ORDER — SODIUM CHLORIDE 0.9% FLUSH
3.0000 mL | Freq: Two times a day (BID) | INTRAVENOUS | Status: DC
  Administered 2023-05-12 – 2023-05-16 (×8): 3 mL via INTRAVENOUS

## 2023-05-11 MED ORDER — MIDAZOLAM HCL (PF) 5 MG/ML IJ SOLN
INTRAMUSCULAR | Status: DC | PRN
  Administered 2023-05-11: 1 mg via INTRAVENOUS
  Administered 2023-05-11 (×2): 2 mg via INTRAVENOUS
  Administered 2023-05-11: 1 mg via INTRAVENOUS

## 2023-05-11 MED ORDER — PROTAMINE SULFATE 10 MG/ML IV SOLN
INTRAVENOUS | Status: AC
  Filled 2023-05-11: qty 25

## 2023-05-11 MED ORDER — ACETAMINOPHEN 160 MG/5ML PO SOLN
650.0000 mg | Freq: Once | ORAL | Status: AC
  Administered 2023-05-11: 650 mg
  Filled 2023-05-11: qty 20.3

## 2023-05-11 MED ORDER — METOCLOPRAMIDE HCL 5 MG/ML IJ SOLN
10.0000 mg | Freq: Four times a day (QID) | INTRAMUSCULAR | Status: AC
  Administered 2023-05-11 – 2023-05-12 (×5): 10 mg via INTRAVENOUS
  Filled 2023-05-11 (×5): qty 2

## 2023-05-11 MED ORDER — DEXTROSE 50 % IV SOLN: 0.0000 mL | INTRAVENOUS | Status: DC | PRN

## 2023-05-11 MED ORDER — MIDAZOLAM HCL (PF) 10 MG/2ML IJ SOLN
INTRAMUSCULAR | Status: AC
  Filled 2023-05-11: qty 2

## 2023-05-11 MED ORDER — PANTOPRAZOLE SODIUM 40 MG PO TBEC
40.0000 mg | DELAYED_RELEASE_TABLET | Freq: Every day | ORAL | Status: DC
  Administered 2023-05-13 – 2023-05-16 (×4): 40 mg via ORAL
  Filled 2023-05-11 (×4): qty 1

## 2023-05-11 MED ORDER — DEXMEDETOMIDINE HCL IN NACL 400 MCG/100ML IV SOLN
0.0000 ug/kg/h | INTRAVENOUS | Status: DC
  Administered 2023-05-11: 0.7 ug/kg/h via INTRAVENOUS
  Filled 2023-05-11: qty 100

## 2023-05-11 MED ORDER — SODIUM CHLORIDE 0.45 % IV SOLN: INTRAVENOUS | Status: DC | PRN

## 2023-05-11 MED ORDER — ORAL CARE MOUTH RINSE: 15.0000 mL | OROMUCOSAL | Status: DC | PRN

## 2023-05-11 MED ORDER — LACTATED RINGERS IV SOLN: INTRAVENOUS | Status: DC | PRN

## 2023-05-11 MED ORDER — CHLORHEXIDINE GLUCONATE 0.12 % MT SOLN
15.0000 mL | Freq: Once | OROMUCOSAL | Status: AC
  Administered 2023-05-11: 15 mL via OROMUCOSAL
  Filled 2023-05-11: qty 15

## 2023-05-11 MED ORDER — PHENYLEPHRINE HCL-NACL 20-0.9 MG/250ML-% IV SOLN: 0.0000 ug/min | INTRAVENOUS | Status: DC

## 2023-05-11 MED ORDER — POTASSIUM CHLORIDE 10 MEQ/50ML IV SOLN: 10.0000 meq | INTRAVENOUS | Status: AC

## 2023-05-11 MED ORDER — PHENYLEPHRINE 80 MCG/ML (10ML) SYRINGE FOR IV PUSH (FOR BLOOD PRESSURE SUPPORT)
PREFILLED_SYRINGE | INTRAVENOUS | Status: AC
  Filled 2023-05-11: qty 10

## 2023-05-11 MED ORDER — VORTIOXETINE HBR 20 MG PO TABS
20.0000 mg | ORAL_TABLET | Freq: Every day | ORAL | Status: DC
Start: 2023-05-12 — End: 2023-05-16
  Administered 2023-05-12 – 2023-05-16 (×5): 20 mg via ORAL
  Filled 2023-05-11 (×5): qty 1

## 2023-05-11 MED ORDER — 0.9 % SODIUM CHLORIDE (POUR BTL) OPTIME
TOPICAL | Status: DC | PRN
Start: 2023-05-11 — End: 2023-05-11
  Administered 2023-05-11: 1000 mL

## 2023-05-11 MED ORDER — FLUTICASONE PROPIONATE 50 MCG/ACT NA SUSP
1.0000 | Freq: Every day | NASAL | Status: DC
  Filled 2023-05-11: qty 16

## 2023-05-11 MED ORDER — EPHEDRINE 5 MG/ML INJ
INTRAVENOUS | Status: AC
  Filled 2023-05-11: qty 5

## 2023-05-11 MED ORDER — SODIUM CHLORIDE 0.9% IV SOLUTION: Freq: Once | INTRAVENOUS | Status: DC

## 2023-05-11 MED ORDER — ACETAMINOPHEN 500 MG PO TABS
1000.0000 mg | ORAL_TABLET | Freq: Four times a day (QID) | ORAL | Status: DC
  Administered 2023-05-11 – 2023-05-16 (×17): 1000 mg via ORAL
  Filled 2023-05-11 (×17): qty 2

## 2023-05-11 MED ORDER — VANCOMYCIN HCL IN DEXTROSE 1-5 GM/200ML-% IV SOLN
1000.0000 mg | Freq: Once | INTRAVENOUS | Status: AC
  Administered 2023-05-11: 1000 mg via INTRAVENOUS
  Filled 2023-05-11: qty 200

## 2023-05-11 MED ORDER — CHLORHEXIDINE GLUCONATE CLOTH 2 % EX PADS
6.0000 | MEDICATED_PAD | Freq: Every day | CUTANEOUS | Status: DC
  Administered 2023-05-12 – 2023-05-16 (×4): 6 via TOPICAL

## 2023-05-11 MED ORDER — SODIUM CHLORIDE 0.9 % IV SOLN
20.0000 ug | Freq: Once | INTRAVENOUS | Status: AC
  Administered 2023-05-11: 20 ug via INTRAVENOUS
  Filled 2023-05-11: qty 5

## 2023-05-11 MED ORDER — ALBUMIN HUMAN 5 % IV SOLN: INTRAVENOUS | Status: DC | PRN

## 2023-05-11 MED ORDER — THROMBIN 20000 UNITS EX SOLR: CUTANEOUS | Status: DC | PRN

## 2023-05-11 MED ORDER — BISACODYL 5 MG PO TBEC
10.0000 mg | DELAYED_RELEASE_TABLET | Freq: Every day | ORAL | Status: DC
  Administered 2023-05-12 – 2023-05-15 (×4): 10 mg via ORAL
  Filled 2023-05-11 (×4): qty 2

## 2023-05-11 MED ORDER — PROTAMINE SULFATE 10 MG/ML IV SOLN
INTRAVENOUS | Status: DC | PRN
  Administered 2023-05-11: 400 mg via INTRAVENOUS

## 2023-05-11 MED ORDER — PANTOPRAZOLE SODIUM 40 MG IV SOLR
40.0000 mg | Freq: Every day | INTRAVENOUS | Status: AC
  Administered 2023-05-11 – 2023-05-12 (×2): 40 mg via INTRAVENOUS
  Filled 2023-05-11 (×2): qty 10

## 2023-05-11 MED ORDER — MIDAZOLAM HCL 2 MG/2ML IJ SOLN: 2.0000 mg | INTRAMUSCULAR | Status: DC | PRN

## 2023-05-11 MED ORDER — ONDANSETRON HCL 4 MG/2ML IJ SOLN
INTRAMUSCULAR | Status: DC | PRN
Start: 2023-05-11 — End: 2023-05-11
  Administered 2023-05-11: 4 mg via INTRAVENOUS

## 2023-05-11 MED ORDER — HEMOSTATIC AGENTS (NO CHARGE) OPTIME
TOPICAL | Status: DC | PRN
  Administered 2023-05-11: 1 via TOPICAL

## 2023-05-11 MED ORDER — EPHEDRINE SULFATE-NACL 50-0.9 MG/10ML-% IV SOSY
PREFILLED_SYRINGE | INTRAVENOUS | Status: DC | PRN
  Administered 2023-05-11 (×2): 2.5 mg via INTRAVENOUS
  Administered 2023-05-11 (×2): 5 mg via INTRAVENOUS

## 2023-05-11 MED ORDER — INSULIN REGULAR(HUMAN) IN NACL 100-0.9 UT/100ML-% IV SOLN: INTRAVENOUS | Status: DC

## 2023-05-11 MED ORDER — DOCUSATE SODIUM 100 MG PO CAPS
200.0000 mg | ORAL_CAPSULE | Freq: Every day | ORAL | Status: DC
  Administered 2023-05-12 – 2023-05-16 (×5): 200 mg via ORAL
  Filled 2023-05-11 (×5): qty 2

## 2023-05-11 MED ORDER — HEPARIN SODIUM (PORCINE) 1000 UNIT/ML IJ SOLN
INTRAMUSCULAR | Status: AC
  Filled 2023-05-11: qty 1

## 2023-05-11 MED ORDER — ROSUVASTATIN CALCIUM 20 MG PO TABS
40.0000 mg | ORAL_TABLET | Freq: Every day | ORAL | Status: DC
  Administered 2023-05-12 – 2023-05-16 (×5): 40 mg via ORAL
  Filled 2023-05-11 (×5): qty 2

## 2023-05-11 MED ORDER — EZETIMIBE 10 MG PO TABS
10.0000 mg | ORAL_TABLET | Freq: Every day | ORAL | Status: DC
  Administered 2023-05-12 – 2023-05-16 (×5): 10 mg via ORAL
  Filled 2023-05-11 (×5): qty 1

## 2023-05-11 MED ORDER — ASPIRIN 81 MG PO CHEW: 324.0000 mg | CHEWABLE_TABLET | Freq: Once | ORAL | Status: DC

## 2023-05-11 MED ORDER — TRAMADOL HCL 50 MG PO TABS
50.0000 mg | ORAL_TABLET | ORAL | Status: DC | PRN
  Administered 2023-05-11 – 2023-05-12 (×2): 100 mg via ORAL
  Administered 2023-05-13: 50 mg via ORAL
  Filled 2023-05-11 (×2): qty 2
  Filled 2023-05-11: qty 1

## 2023-05-11 MED ORDER — METOPROLOL TARTRATE 12.5 MG HALF TABLET
12.5000 mg | ORAL_TABLET | Freq: Once | ORAL | Status: AC
  Administered 2023-05-11: 12.5 mg via ORAL
  Filled 2023-05-11: qty 1

## 2023-05-11 MED ORDER — SODIUM CHLORIDE 0.9 % IV SOLN: INTRAVENOUS | Status: DC

## 2023-05-11 MED ORDER — ALBUMIN HUMAN 5 % IV SOLN
250.0000 mL | INTRAVENOUS | Status: DC | PRN
  Administered 2023-05-11 (×3): 12.5 g via INTRAVENOUS
  Filled 2023-05-11: qty 250

## 2023-05-11 MED ORDER — LIDOCAINE 2% (20 MG/ML) 5 ML SYRINGE
INTRAMUSCULAR | Status: AC
  Filled 2023-05-11: qty 5

## 2023-05-11 MED ORDER — OXYCODONE HCL 5 MG PO TABS
5.0000 mg | ORAL_TABLET | ORAL | Status: DC | PRN
  Administered 2023-05-11: 5 mg via ORAL
  Administered 2023-05-12: 10 mg via ORAL
  Filled 2023-05-11: qty 2
  Filled 2023-05-11: qty 1

## 2023-05-11 MED ORDER — MAGNESIUM SULFATE 4 GM/100ML IV SOLN
4.0000 g | Freq: Once | INTRAVENOUS | Status: AC
  Administered 2023-05-11: 4 g via INTRAVENOUS
  Filled 2023-05-11: qty 100

## 2023-05-11 MED ORDER — ~~LOC~~ CARDIAC SURGERY, PATIENT & FAMILY EDUCATION
Freq: Once | Status: DC
Start: 2023-05-11 — End: 2023-05-11
  Filled 2023-05-11: qty 1

## 2023-05-11 MED ORDER — ASPIRIN 81 MG PO CHEW: 324.0000 mg | CHEWABLE_TABLET | Freq: Every day | ORAL | Status: DC

## 2023-05-11 MED ORDER — FENTANYL CITRATE (PF) 250 MCG/5ML IJ SOLN
INTRAMUSCULAR | Status: AC
  Filled 2023-05-11: qty 5

## 2023-05-11 MED ORDER — PHENYLEPHRINE 80 MCG/ML (10ML) SYRINGE FOR IV PUSH (FOR BLOOD PRESSURE SUPPORT)
PREFILLED_SYRINGE | INTRAVENOUS | Status: DC | PRN
  Administered 2023-05-11 (×2): 160 ug via INTRAVENOUS

## 2023-05-11 MED ORDER — CHLORHEXIDINE GLUCONATE 0.12 % MT SOLN
15.0000 mL | OROMUCOSAL | Status: AC
  Administered 2023-05-11: 15 mL via OROMUCOSAL
  Filled 2023-05-11: qty 15

## 2023-05-11 MED ORDER — BISACODYL 10 MG RE SUPP: 10.0000 mg | Freq: Every day | RECTAL | Status: DC

## 2023-05-11 MED ORDER — FENTANYL CITRATE (PF) 250 MCG/5ML IJ SOLN
INTRAMUSCULAR | Status: DC | PRN
Start: 2023-05-11 — End: 2023-05-11
  Administered 2023-05-11 (×2): 100 ug via INTRAVENOUS
  Administered 2023-05-11: 150 ug via INTRAVENOUS
  Administered 2023-05-11 (×3): 50 ug via INTRAVENOUS
  Administered 2023-05-11: 250 ug via INTRAVENOUS

## 2023-05-11 MED ORDER — PROPOFOL 10 MG/ML IV BOLUS
INTRAVENOUS | Status: DC | PRN
  Administered 2023-05-11: 20 mg via INTRAVENOUS
  Administered 2023-05-11: 60 mg via INTRAVENOUS

## 2023-05-11 MED ORDER — METOPROLOL TARTRATE 5 MG/5ML IV SOLN: 2.5000 mg | INTRAVENOUS | Status: DC | PRN

## 2023-05-11 MED ORDER — METOPROLOL TARTRATE 25 MG/10 ML ORAL SUSPENSION: 12.5000 mg | Freq: Two times a day (BID) | ORAL | Status: DC

## 2023-05-11 MED ORDER — ONDANSETRON HCL 4 MG/2ML IJ SOLN: 4.0000 mg | Freq: Four times a day (QID) | INTRAMUSCULAR | Status: DC | PRN

## 2023-05-11 MED ORDER — MORPHINE SULFATE (PF) 2 MG/ML IV SOLN
1.0000 mg | INTRAVENOUS | Status: DC | PRN
  Administered 2023-05-11 – 2023-05-12 (×3): 2 mg via INTRAVENOUS
  Administered 2023-05-12: 4 mg via INTRAVENOUS
  Filled 2023-05-11 (×3): qty 1
  Filled 2023-05-11: qty 2

## 2023-05-11 MED ORDER — PLASMA-LYTE A IV SOLN: INTRAVENOUS | Status: DC | PRN

## 2023-05-11 MED ORDER — SODIUM BICARBONATE 8.4 % IV SOLN
50.0000 meq | Freq: Once | INTRAVENOUS | Status: AC
  Administered 2023-05-11: 50 meq via INTRAVENOUS

## 2023-05-11 MED ORDER — ACETAMINOPHEN 160 MG/5ML PO SOLN: 1000.0000 mg | Freq: Four times a day (QID) | ORAL | Status: DC

## 2023-05-11 MED ORDER — THROMBIN (RECOMBINANT) 20000 UNITS EX SOLR
CUTANEOUS | Status: AC
  Filled 2023-05-11: qty 20000

## 2023-05-11 SURGICAL SUPPLY — 76 items
ADAPTER CARDIO PERF ANTE/RETRO (ADAPTER) ×3 IMPLANT
BAG DECANTER FOR FLEXI CONT (MISCELLANEOUS) ×3 IMPLANT
BLADE CLIPPER SURG (BLADE) ×3 IMPLANT
BLADE STERNUM SYSTEM 6 (BLADE) ×3 IMPLANT
BLADE SURG 15 STRL LF DISP TIS (BLADE) ×3 IMPLANT
CANISTER SUCT 3000ML PPV (MISCELLANEOUS) ×3 IMPLANT
CANNULA ARTERIAL NVNT 3/8 22FR (MISCELLANEOUS) IMPLANT
CANNULA GUNDRY RCSP 15FR (MISCELLANEOUS) ×3 IMPLANT
CANNULA MC2 2 STG 36/46 CONN (CANNULA) IMPLANT
CATH HEART VENT LEFT (CATHETERS) ×3 IMPLANT
CATH ROBINSON RED A/P 18FR (CATHETERS) ×9 IMPLANT
CATH THORACIC 36FR (CATHETERS) ×3 IMPLANT
CATH THORACIC 36FR RT ANG (CATHETERS) ×3 IMPLANT
CNTNR URN SCR LID CUP LEK RST (MISCELLANEOUS) ×3 IMPLANT
CONTAINER PROTECT SURGISLUSH (MISCELLANEOUS) ×6 IMPLANT
COVER SURGICAL LIGHT HANDLE (MISCELLANEOUS) ×3 IMPLANT
DEVICE SUT CK QUICK LOAD MINI (Prosthesis & Implant Heart) IMPLANT
DRAPE SRG 135X102X78XABS (DRAPES) ×3 IMPLANT
DRAPE WARM FLUID 44X44 (DRAPES) ×3 IMPLANT
DRSG COVADERM 4X14 (GAUZE/BANDAGES/DRESSINGS) ×3 IMPLANT
ELECT CAUTERY BLADE 6.4 (BLADE) ×3 IMPLANT
ELECT REM PT RETURN 9FT ADLT (ELECTROSURGICAL) ×4 IMPLANT
ELECTRODE REM PT RTRN 9FT ADLT (ELECTROSURGICAL) ×6 IMPLANT
FELT TEFLON 1X6 (MISCELLANEOUS) ×6 IMPLANT
GAUZE 4X4 16PLY ~~LOC~~+RFID DBL (SPONGE) ×3 IMPLANT
GAUZE SPONGE 4X4 12PLY STRL (GAUZE/BANDAGES/DRESSINGS) ×3 IMPLANT
GAUZE SPONGE 4X4 12PLY STRL LF (GAUZE/BANDAGES/DRESSINGS) IMPLANT
GLOVE BIO SURGEON STRL SZ 6 (GLOVE) IMPLANT
GLOVE BIO SURGEON STRL SZ 6.5 (GLOVE) IMPLANT
GLOVE BIO SURGEON STRL SZ7 (GLOVE) IMPLANT
GLOVE BIO SURGEON STRL SZ7.5 (GLOVE) IMPLANT
GLOVE SURG MICRO LTX SZ7 (GLOVE) ×6 IMPLANT
GOWN STRL REUS W/ TWL LRG LVL3 (GOWN DISPOSABLE) ×12 IMPLANT
GOWN STRL REUS W/ TWL XL LVL3 (GOWN DISPOSABLE) ×3 IMPLANT
HEMOSTAT POWDER SURGIFOAM 1G (HEMOSTASIS) ×9 IMPLANT
HEMOSTAT SURGICEL 2X14 (HEMOSTASIS) ×3 IMPLANT
KIT BASIN OR (CUSTOM PROCEDURE TRAY) ×3 IMPLANT
KIT CATH CPB BARTLE (MISCELLANEOUS) ×3 IMPLANT
KIT SUCTION CATH 14FR (SUCTIONS) ×3 IMPLANT
KIT SUT CK MINI COMBO 4X17 (Prosthesis & Implant Heart) IMPLANT
KIT TURNOVER KIT B (KITS) ×3 IMPLANT
LINE VENT (MISCELLANEOUS) IMPLANT
NS IRRIG 1000ML POUR BTL (IV SOLUTION) ×18 IMPLANT
PACK E OPEN HEART (SUTURE) ×3 IMPLANT
PACK OPEN HEART (CUSTOM PROCEDURE TRAY) ×3 IMPLANT
PAD ARMBOARD 7.5X6 YLW CONV (MISCELLANEOUS) ×6 IMPLANT
POSITIONER HEAD DONUT 9IN (MISCELLANEOUS) ×3 IMPLANT
SET MPS 3-ND DEL (MISCELLANEOUS) IMPLANT
SPONGE T-LAP 18X18 ~~LOC~~+RFID (SPONGE) ×12 IMPLANT
SPONGE T-LAP 4X18 ~~LOC~~+RFID (SPONGE) ×3 IMPLANT
SUT BONE WAX W31G (SUTURE) ×3 IMPLANT
SUT EB EXC GRN/WHT 2-0 V-5 (SUTURE) ×6 IMPLANT
SUT ETHIBON EXCEL 2-0 V-5 (SUTURE) IMPLANT
SUT ETHIBOND 2 0 SH 36X2 (SUTURE) IMPLANT
SUT ETHIBOND V-5 VALVE (SUTURE) IMPLANT
SUT PROLENE 3 0 SH DA (SUTURE) IMPLANT
SUT PROLENE 3 0 SH1 36 (SUTURE) ×3 IMPLANT
SUT PROLENE 4-0 RB1 .5 CRCL 36 (SUTURE) ×9 IMPLANT
SUT SILK 2 0 SH CR/8 (SUTURE) IMPLANT
SUT STEEL 6MS V (SUTURE) IMPLANT
SUT STEEL STERNAL CCS#1 18IN (SUTURE) IMPLANT
SUT STEEL SZ 6 DBL 3X14 BALL (SUTURE) IMPLANT
SUT VIC AB 1 CTX36XBRD ANBCTR (SUTURE) ×6 IMPLANT
SUT VIC AB 2-0 CT2 27 (SUTURE) IMPLANT
SUT VIC AB 2-0 CTX 36 (SUTURE) IMPLANT
SUT VIC AB 3-0 X1 27 (SUTURE) IMPLANT
SYSTEM SAHARA CHEST DRAIN ATS (WOUND CARE) ×3 IMPLANT
TAPE CLOTH SURG 4X10 WHT LF (GAUZE/BANDAGES/DRESSINGS) IMPLANT
TAPE PAPER 2X10 WHT MICROPORE (GAUZE/BANDAGES/DRESSINGS) IMPLANT
TOWEL GREEN STERILE (TOWEL DISPOSABLE) ×3 IMPLANT
TOWEL GREEN STERILE FF (TOWEL DISPOSABLE) ×3 IMPLANT
TRAY FOLEY SLVR 16FR TEMP STAT (SET/KITS/TRAYS/PACK) ×3 IMPLANT
UNDERPAD 30X36 HEAVY ABSORB (UNDERPADS AND DIAPERS) ×3 IMPLANT
VALVE AORTIC SZ23 INSP/RESIL (Prosthesis & Implant Heart) IMPLANT
VENT LEFT HEART 12002 (CATHETERS) ×2 IMPLANT
WATER STERILE IRR 1000ML POUR (IV SOLUTION) ×6 IMPLANT

## 2023-05-11 NOTE — Procedures (Signed)
 Extubation Procedure Note  Patient Details:   Name: Thomas [name] DOB: October 18, 1955 MRN: [id number]   Airway Documentation:    Vent end date: 05/11/23 Vent end time: 1822   Evaluation  O2 sats: stable throughout Complications: No apparent complications Patient did tolerate procedure well. Bilateral Breath Sounds: Clear, Diminished, Rhonchi   Yes  Pt extubated per rapid wean protocol. Pt NIF -26, VC , positive cuff leak. Upon extubation, pt placed on 3L nasal cannula. Pt able to speak name, give a good cough and no stridor heard at this time. RT will continue to monitor and be available as needed.   Derinda Late 05/11/2023, 6:27 PM

## 2023-05-11 NOTE — Transfer of Care (Signed)
 Immediate Anesthesia Transfer of Care Note  Patient: REINHARDT LICAUSI  Procedure(s) Performed: AORTIC VALVE REPLACEMENT USING INSPIRIS RESILIA AORTIC VALVE SIZE 23 MM (Chest) TRANSESOPHAGEAL ECHOCARDIOGRAM (TEE)  Patient Location: SICU  Anesthesia Type:General  Level of Consciousness: sedated and Patient remains intubated per anesthesia plan  Airway & Oxygen Therapy: Patient remains intubated per anesthesia plan and Patient placed on Ventilator (see vital sign flow sheet for setting)  Post-op Assessment: Report given to RN and Post -op Vital signs reviewed and stable  Post vital signs: Reviewed and stable  Last Vitals:  Vitals Value Taken Time  BP 81/57 05/11/23 1443  Temp 37 C 05/11/23 1444  Pulse 64 05/11/23 1444  Resp 18 05/11/23 1444  SpO2 96 % 05/11/23 1444  Vitals shown include unfiled device data.  Last Pain:  Vitals:   05/11/23 0615  TempSrc:   PainSc: 0-No pain         Complications: No notable events documented.

## 2023-05-11 NOTE — Hospital Course (Addendum)
 PCP is Emilio Aspen, MD Referring Provider is Little Ishikawa  History of Present Illness:  Thomas [name] is a 68 year old gentleman with history of hypertension, hyperlipidemia, prostate cancer status post robotic assisted radical prostatectomy in 2016, and aortic stenosis followed by Dr. Bjorn Pippin.  His most recent echocardiogram on 10/01/2022 showed a trileaflet aortic valve with severe calcification and thickening.  The mean gradient had increased to 47 mmHg with a peak of 80 mmHg and a valve area by VTI of 0.84 cm.  Dimensionless index was 0.22.  Left ventricular ejection fraction was 60 to 65% with moderate concentric LVH and grade 1 diastolic dysfunction.  He was referred to Dr. Clifton James for valve clinic evaluation and underwent cardiac catheterization on 10/19/2022.  This showed mild nonobstructive disease in the LAD and otherwise normal coronary arteries.  Right heart pressures were normal with a PA pressure of 32/14 and a wedge pressure of 8.  The mean gradient across the aortic valve at cath was measured at 28.5 mmHg.  He underwent a gated cardiac CTA showing an aortic valve calcium score of 1702.  The valve was severely thickened and functionally bicuspid with a raphae between the left and right cusps.  There was severe calcification and reduced leaflet excursion.  There was no ascending aortic aneurysm.  He was seen by Dr. Bjorn Pippin on 03/02/2023 and reported onset of shortness of breath with exertion.  He is also reported some vague left-sided chest pressure which he says is present most of the time.  He denies any dizziness or syncope.  He has had no peripheral edema.  He denies orthopnea and PND.  He was referred to [facility] and evaluated by Dr. Laneta Simmers who was in agreement the patient would benefit from Aortic Valve Replacement.  The risks and benefits of the procedure was explained to the patient and he was agreeable to proceed.  Hospital Course:  Mr.  Nephew presented to [facility] on [facility]/11/2023.  He was taken to the operating room and underwent Aortic Valve Replacement with a 23mm Inspiris Resilia bioprosthetic valve.  Following the procedure he separated from cardiopulmonary bypass without dificulty and required no inotropes. He was transferred to the ICU in stable condition. He remained stable and was weaned from the mechanical ventilator and extubated by 6:30pm on the day of surgery.  The monitoring lines were removed on post-op day 1 and diuresis was begun. He was mobilized. Lovenox was held due to moderate thrombocytopenia. He had an accelerated junctional rhythm that resolved with time. His epicardial pacing wires were removed on 3/1. He was felt stable for transfer to the progressive unit.  His platelet count recovered without intervention.  He continues to maintain NSR.  He is ambulating independently.  His surgical incisions are healing without evidence of infection.  He is felt stable for discharge home today.

## 2023-05-11 NOTE — Anesthesia Procedure Notes (Signed)
 Procedure Name: Intubation Date/Time: 05/11/2023 7:51 AM  Performed by: April Holding, CRNAPre-anesthesia Checklist: Patient identified, Emergency Drugs available, Suction available and Patient being monitored Patient Re-evaluated:Patient Re-evaluated prior to induction Oxygen Delivery Method: Circle System Utilized Preoxygenation: Pre-oxygenation with 100% oxygen Induction Type: IV induction Ventilation: Mask ventilation without difficulty Grade View: Grade II Tube type: Oral Tube size: 8.0 mm Number of attempts: 1 Airway Equipment and Method: Stylet, Oral airway and Bite block Placement Confirmation: ETT inserted through vocal cords under direct vision, positive ETCO2 and breath sounds checked- equal and bilateral Secured at: 24 cm Tube secured with: Tape Dental Injury: Teeth and Oropharynx as per pre-operative assessment

## 2023-05-11 NOTE — Anesthesia Postprocedure Evaluation (Signed)
 Anesthesia Post Note  Patient: Thomas [name]  Procedure(s) Performed: AORTIC VALVE REPLACEMENT USING INSPIRIS RESILIA AORTIC VALVE SIZE 23 MM (Chest) TRANSESOPHAGEAL ECHOCARDIOGRAM (TEE)     Patient location during evaluation: SICU Anesthesia Type: General Level of consciousness: sedated Vital Signs Assessment: post-procedure vital signs reviewed and stable Respiratory status: respiratory function stable and patient remains intubated per anesthesia plan Cardiovascular status: stable Anesthetic complications: no   No notable events documented.  Last Vitals:  Vitals:   05/11/23 0723 05/11/23 1430  BP:  126/68  Pulse: (!) 51 82  Resp: (!) 9 16  Temp:    SpO2: 98% 97%    Last Pain:  Vitals:   05/11/23 0615  TempSrc:   PainSc: 0-No pain                 [place] Nation

## 2023-05-11 NOTE — Op Note (Signed)
 CARDIOVASCULAR SURGERY OPERATIVE NOTE  05/11/2023 Thomas [name] [id number]  Surgeon:  Alleen Borne, MD  First Assistant: Jayme Cloud, MD, [facility]   Preoperative Diagnosis:  Severe aortic stenosis   Postoperative Diagnosis:  Same   Procedure:  Median Sternotomy Extracorporeal circulation 3.   Aortic valve replacement using a 23 mm INSPIRIS RESILIA pericardial valve.  Anesthesia:  General Endotracheal   Clinical History/Surgical Indication:  This 68 year old gentleman has stage D, severe, symptomatic aortic stenosis with NYHA class II symptoms of exertional shortness of breath consistent with chronic diastolic congestive heart failure.  I have personally reviewed his 2D echocardiogram, cardiac catheterization, and gated cardiac CTA.  His echo shows a heavily calcified and thickened aortic valve with restricted leaflet mobility.  The mean gradient is 47 mmHg with a valve area of 0.8 cm consistent with severe aortic stenosis.  Left ventricular systolic function is normal.  His catheterization shows mild nonobstructive coronary disease with normal right heart pressures.  His gated cardiac CTA shows a functionally bicuspid aortic valve that is severely thickened with restricted leaflet mobility.  The calcium score was 1702 consistent with severe aortic stenosis.  I agree that aortic valve replacement is indicated in this patient for relief of his symptoms and to prevent progressive left ventricular dysfunction.  Given his age of 68 with a heavily calcified bicuspid aortic valve I think that open surgical aortic valve replacement would be the best treatment option for him.  I reviewed the echo, cath, and CT images with him and his wife and answered all their questions.  We discussed the pros and cons of mechanical and bioprosthetic prostheses and my recommendation for a bovine pericardial bioprosthesis.  He is in agreement with that. I discussed the operative procedure with the  patient and his wife including alternatives, benefits and risks; including but not limited to bleeding, blood transfusion, infection, stroke, myocardial infarction, graft failure, heart block requiring a permanent pacemaker, organ dysfunction, and death.  [name] understands and agrees to proceed.     Preparation:  The patient was seen in the preoperative holding area and the correct patient, correct operation were confirmed with the patient after reviewing the medical record and catheterization. The consent was signed by me. Preoperative antibiotics were given. A pulmonary arterial line and radial arterial line were placed by the anesthesia team. The patient was taken back to the operating room and positioned supine on the operating room table. After being placed under general endotracheal anesthesia by the anesthesia team a foley catheter was placed. The neck, chest, abdomen, and both legs were prepped with betadine soap and solution and draped in the usual sterile manner. A surgical time-out was taken and the correct patient and operative procedure were confirmed with the nursing and anesthesia staff.   TEE:   Complete TEE assessment was performed by Dr. Charlynn Grimes.   *INTRAOPERATIVE TRANSESOPHAGEAL REPORT *      Patient Name:   Thomas [name] Date of Exam: 05/11/2023  Medical Rec #:  [id number]       Height:       71.0 in  Accession #:    [id number]      Weight:       278.0 lb  Date of Birth:  [birth year]        BSA:          2.43 m  Patient Age:    68 years        BP:  Patient Gender: M  HR:  Exam Location:  Anesthesiology   Transesophogeal exam was perform intraoperatively during surgical  procedure.  Patient was closely monitored under general anesthesia during the entirety  of  examination.   Indications:    AVR  Performing Phys: Anice Paganini   Complications: No known complications during this procedure.  POST-OP IMPRESSIONS  _ Left Ventricle: The left ventriclar  function is normal following CPB.  _ Right Ventricle: The right ventriclar function is normal following CPB.  _ Aorta: The aorta appears unchanged from pre-bypass. There is no  dissection.  _ Aortic Valve: There is a 23mm Inspiris valve in the aortic position. The  valve  is well seated with no paravalvular leak. Mean PG .  _ Mitral Valve: The mitral valve appears unchanged from pre-bypass. Trace  MR.  There is chordal SAM.  _ Tricuspid Valve: The tricuspid valve appears unchanged from pre-bypass.  Mild  TR.  _ Pulmonic Valve: The pulmonic valve appears unchanged from pre-bypass.  _ Pericardium: The pericardium appears unchanged from pre-bypass.   PRE-OP FINDINGS   Left Ventricle: The left ventricle has normal systolic function, with an  ejection fraction of 55-60%. The cavity size was normal. There is moderate  left ventricular hypertrophy.    Right Ventricle: The right ventricle has normal systolic function. The  cavity was normal. There is no increase in right ventricular wall  thickness.   Left Atrium: No left atrial/left atrial appendage thrombus was detected.   Interatrial Septum: No atrial level shunt detected by color flow Doppler.  There is no evidence of a patent foramen ovale.   Pericardium: There is no evidence of pericardial effusion. There is no  pleural effusion.   Mitral Valve: The mitral valve is normal in structure. Mitral valve  regurgitation is trivial by color flow Doppler. There is chordal SAM.   Tricuspid Valve: The tricuspid valve was normal in structure. Tricuspid  valve regurgitation is trivial by color flow Doppler.   Aortic Valve: The aortic valve is tricuspid. The leaflets are heavily  calcified with reduced excursion. There is severe aortic stenosis. AVA  0.96cm^2. There is trace aortic insufficiency.    Pulmonic Valve: The pulmonic valve was normal in structure.  Pulmonic valve regurgitation is trivial by color flow Doppler.     Aorta: The aortic root, ascending aorta and aortic arch are normal in size  and structure. There is no dissect.     Anice Paganini  Electronically signed by Anice Paganini  Signature Date/Time: 05/11/2023/3:41:11 PM        Final        Cardiopulmonary Bypass:  A median sternotomy was performed. The pericardium was opened in the midline. Right ventricular function appeared normal. The ascending aorta was of normal size and had no palpable plaque. There were no contraindications to aortic cannulation or cross-clamping. The patient was fully systemically heparinized and the ACT was maintained > 400 sec. The proximal aortic arch was cannulated with a 22 F aortic cannula for arterial inflow. Venous cannulation was performed via the right atrial appendage using a two-staged venous cannula. An antegrade cardioplegia/vent cannula was inserted into the mid-ascending aorta. A left ventricular vent was placed via the right superior pulmonary vein. A retrograde cardioplegia cannnula was placed into the coronary sinus via the right atrium. Aortic occlusion was performed with a single cross-clamp. Systemic cooling to 32 degrees Centigrade and topical cooling of the heart with iced saline were used. Cold  antegrade KBC cardioplegia was used to induce diastolic arrest  and then cold blood retrograde cardioplegia was given at about 60 minute intervals throughout the period of arrest to maintain myocardial temperature at or below 10 degrees centigrade. A temperature probe was inserted into the interventricular septum and an insulating pad was placed in the pericardium. Carbon dioxide was insufflated into the pericardium at 5L/min throughout the procedure to minimize intracardiac air.   Aortic Valve Replacement:   A transverse aortotomy was performed 1 cm above the take-off of the right coronary artery. The native valve was tricuspid with severely calcified leaflets and moderate annular calcification. The ostia of the  coronary arteries were in normal position and were not obstructed. The native valve leaflets were excised and the annulus was decalcified with rongeurs. Care was taken to remove all particulate debris. The left ventricle was directly inspected for debris and then irrigated with ice saline solution. The annulus was sized and a size 23 mm INSPIRIS RESILIA pericardial valve was chosen. The model number was [id number] and the serial number was [id number]. While the valve was being prepared 2-0 Ethibond pledgeted horizontal mattress sutures were placed around the annulus with the pledgets in a sub-annular position. The sutures were placed through the sewing ring and the valve lowered into place. The sutures were tied using CorKnots. The valve seated nicely and the coronary ostia were not obstructed. The prosthetic valve leaflets moved normally and there was no sub-valvular obstruction. The aortotomy was closed using 4-0 Prolene suture in 2 layers with felt strips to reinforce the closure.  Completion:  The patient was rewarmed to 37 degrees Centigrade. De-airing maneuvers were performed and the head placed in trendelenburg position. The crossclamp was removed with a time of 148 minutes. There was spontaneous return of sinus rhythm. The aortotomy was checked for hemostasis. Two temporary epicardial pacing wires were placed on the right atrium and two on the right ventricle. The left ventricular vent and retrograde cardioplegia cannulas were removed. The patient was weaned from CPB without difficulty on no inotropes. CPB time was 190 minutes. Cardiac output was 5 LPM. Heparin was fully reversed with protamine and the aortic and venous cannulas removed. Hemostasis was achieved. Mediastinal drainage tubes were placed. The sternum was closed with double #6 stainless steel wires. The fascia was closed with continuous # 1 vicryl suture. The subcutaneous tissue was closed with 2-0 vicryl continuous suture. The skin was closed with  3-0 vicryl subcuticular suture. All sponge, needle, and instrument counts were reported correct at the end of the case. Dry sterile dressings were placed over the incisions and around the chest tubes which were connected to pleurevac suction. The patient was then transported to the surgical intensive care unit in stable condition.

## 2023-05-11 NOTE — Brief Op Note (Signed)
 05/11/2023  2:44 PM  PATIENT:  Thomas [name]  68 y.o. male  PRE-OPERATIVE DIAGNOSIS:  SEVERE AORTIC STENOSIS  POST-OPERATIVE DIAGNOSIS:  SEVERE AORTIC STENOSIS  PROCEDURE:  Procedure(s): AORTIC VALVE REPLACEMENT USING INSPIRIS RESILIA AORTIC VALVE SIZE 23 MM (N/A) TRANSESOPHAGEAL ECHOCARDIOGRAM (TEE) (N/A)  SURGEON:  Surgeons and Role:    * Alleen Borne, MD - Primary  PHYSICIAN ASSISTANT: none  ASSISTANTS: Alfonso Patten, MD  ANESTHESIA:   general  EBL:  1380 mL   BLOOD ADMINISTERED:none  DRAINS:  two Chest Tube(s) in the mediastinuim    LOCAL MEDICATIONS USED:  NONE  SPECIMEN:  Source of Specimen:  aortic valve  DISPOSITION OF SPECIMEN:  PATHOLOGY  COUNTS:  YES  TOURNIQUET:  * No tourniquets in log *  DICTATION: .Note written in EPIC  PLAN OF CARE: Admit to inpatient   PATIENT DISPOSITION:  ICU - intubated and hemodynamically stable.   Delay start of Pharmacological VTE agent (>24hrs) due to surgical blood loss or risk of bleeding: yes

## 2023-05-11 NOTE — Anesthesia Procedure Notes (Signed)
 Central Venous Catheter Insertion Performed by: Brocket Nation, MD, anesthesiologist Start/End2/25/2025 6:55 AM, 05/11/2023 7:05 AM Patient location: Pre-op. Preanesthetic checklist: patient identified, IV checked, site marked, risks and benefits discussed, surgical consent, monitors and equipment checked, pre-op evaluation, timeout performed and anesthesia consent Position: Trendelenburg Lidocaine 1% used for infiltration and patient sedated Hand hygiene performed , maximum sterile barriers used  and Seldinger technique used Catheter size: 8.5 Fr Total catheter length 16. Central line was placed.Double lumen Swan type:thermodilution Procedure performed using ultrasound guided technique. Ultrasound Notes:anatomy identified, needle tip was noted to be adjacent to the nerve/plexus identified, no ultrasound evidence of intravascular and/or intraneural injection and image(s) printed for medical record Attempts: 1 Following insertion, dressing applied, line sutured and Biopatch. Post procedure assessment: blood return through all ports  Patient tolerated the procedure well with no immediate complications.

## 2023-05-11 NOTE — Anesthesia Procedure Notes (Addendum)
 Arterial Line Insertion Start/End2/25/2025 6:43 AM, 05/11/2023 6:52 AM Performed by: [place], MD, April Holding, CRNA, CRNA  Patient location: Pre-op. Preanesthetic checklist: patient identified, IV checked, site marked, risks and benefits discussed, surgical consent, monitors and equipment checked, pre-op evaluation, timeout performed and anesthesia consent Lidocaine 1% used for infiltration Left, radial was placed Catheter size: 20 G Hand hygiene performed  and maximum sterile barriers used   Attempts: 1 Procedure performed using ultrasound guided technique. Ultrasound Notes:anatomy identified, needle tip was noted to be adjacent to the nerve/plexus identified and no ultrasound evidence of intravascular and/or intraneural injection Following insertion, dressing applied. Post procedure assessment: normal and unchanged  Patient tolerated the procedure well with no immediate complications.

## 2023-05-11 NOTE — Progress Notes (Signed)
 Echocardiogram Echocardiogram Transesophageal has been performed.  Thomas [name] 05/11/2023, 6:33 PM

## 2023-05-11 NOTE — Interval H&P Note (Signed)
 History and Physical Interval Note:  05/11/2023 6:32 AM  [name]  has presented today for surgery, with the diagnosis of SEVERE AS.  The various methods of treatment have been discussed with the patient and family. After consideration of risks, benefits and other options for treatment, the patient has consented to  Procedure(s): AORTIC VALVE REPLACEMENT (AVR) (N/A) TRANSESOPHAGEAL ECHOCARDIOGRAM (TEE) (N/A) as a surgical intervention.  The patient's history has been reviewed, patient examined, no change in status, stable for surgery.  I have reviewed the patient's chart and labs.  Questions were answered to the patient's satisfaction.     Alleen Borne

## 2023-05-11 NOTE — Progress Notes (Signed)
      [address]       [place] [address]             [phone]      S/p AVR  Extubated  BP 108/68   Pulse 79   Temp 98.6 F (37 C) (Core)   Resp 13   Ht 5\' 11"  (1.803 m)   Wt 126.1 kg   SpO2 94%   BMI 38.77 kg/m  3L [place] 94% sat  Intake/Output Summary (Last 24 hours) at 05/11/2023 1954 Last data filed at 05/11/2023 1900 Gross per 24 hour  Intake 6292.69 ml  Output 3035 ml  Net 3257.69 ml   CT 600 ml since OR  K 4.5, Hct 30  Doing well early postop  Viviann Spare C. Dorris Fetch, MD [facility] [phone]

## 2023-05-12 ENCOUNTER — Other Ambulatory Visit: Payer: Self-pay | Admitting: Cardiology

## 2023-05-12 ENCOUNTER — Encounter (HOSPITAL_COMMUNITY): Payer: Self-pay | Admitting: Surgery

## 2023-05-12 ENCOUNTER — Inpatient Hospital Stay (HOSPITAL_COMMUNITY): Payer: Medicare Other

## 2023-05-12 DIAGNOSIS — Z952 Presence of prosthetic heart valve: Secondary | ICD-10-CM

## 2023-05-12 LAB — TYPE AND SCREEN
ABO/RH(D): O POS
Antibody Screen: NEGATIVE

## 2023-05-12 LAB — MAGNESIUM
Magnesium: 2.6 mg/dL — ABNORMAL HIGH (ref 1.7–2.4)
Magnesium: 2.3 mg/dL (ref 1.7–2.4)

## 2023-05-12 LAB — BASIC METABOLIC PANEL
Anion gap: 6 (ref 5–15)
Anion gap: 4 — ABNORMAL LOW (ref 5–15)
BUN: 19 mg/dL (ref 8–23)
BUN: 17 mg/dL (ref 8–23)
CO2: 26 mmol/L (ref 22–32)
CO2: 28 mmol/L (ref 22–32)
Calcium: 8.1 mg/dL — ABNORMAL LOW (ref 8.9–10.3)
Calcium: 8.1 mg/dL — ABNORMAL LOW (ref 8.9–10.3)
Chloride: 106 mmol/L (ref 98–111)
Chloride: 103 mmol/L (ref 98–111)
Creatinine, Ser: 0.98 mg/dL (ref 0.61–1.24)
Creatinine, Ser: 1 mg/dL (ref 0.61–1.24)
GFR, Estimated: >60 mL/min (ref >60)
GFR, Estimated: >60 mL/min (ref >60)
Glucose, Bld: 131 mg/dL — ABNORMAL HIGH (ref 70–99)
Glucose, Bld: 130 mg/dL — ABNORMAL HIGH (ref 70–99)
Potassium: 4.3 mmol/L (ref 3.5–5.1)
Potassium: 3.7 mmol/L (ref 3.5–5.1)
Sodium: 138 mmol/L (ref 135–145)
Sodium: 135 mmol/L (ref 135–145)

## 2023-05-12 LAB — PREPARE FRESH FROZEN PLASMA: Unit division: 0

## 2023-05-12 LAB — CBC
HCT: 28.6 % — ABNORMAL LOW (ref 39.0–52.0)
HCT: 29.5 % — ABNORMAL LOW (ref 39.0–52.0)
Hemoglobin: 9.6 g/dL — ABNORMAL LOW (ref 13.0–17.0)
Hemoglobin: 9.9 g/dL — ABNORMAL LOW (ref 13.0–17.0)
MCH: 30.3 pg (ref 26.0–34.0)
MCH: 30.5 pg (ref 26.0–34.0)
MCHC: 33.6 g/dL (ref 30.0–36.0)
MCHC: 33.6 g/dL (ref 30.0–36.0)
MCV: 90.2 fL (ref 80.0–100.0)
MCV: 90.8 fL (ref 80.0–100.0)
Platelets: 86 10*3/uL — ABNORMAL LOW (ref 150–400)
Platelets: 96 10*3/uL — ABNORMAL LOW (ref 150–400)
RBC: 3.17 MIL/uL — ABNORMAL LOW (ref 4.22–5.81)
RBC: 3.25 MIL/uL — ABNORMAL LOW (ref 4.22–5.81)
RDW: 13.6 % (ref 11.5–15.5)
RDW: 13.8 % (ref 11.5–15.5)
WBC: 6.6 10*3/uL (ref 4.0–10.5)
WBC: 8.2 10*3/uL (ref 4.0–10.5)
nRBC: 0 % (ref 0.0–0.2)
nRBC: 0 % (ref 0.0–0.2)

## 2023-05-12 LAB — BPAM FFP
Blood Product Expiration Date: 202503022359
Blood Product Expiration Date: 202503022359
ISSUE DATE / TIME: 202502251724
ISSUE DATE / TIME: 202502251724
Unit Type and Rh: 5100
Unit Type and Rh: 5100

## 2023-05-12 LAB — GLUCOSE, CAPILLARY
Glucose-Capillary: 100 mg/dL — ABNORMAL HIGH (ref 70–99)
Glucose-Capillary: 119 mg/dL — ABNORMAL HIGH (ref 70–99)
Glucose-Capillary: 122 mg/dL — ABNORMAL HIGH (ref 70–99)
Glucose-Capillary: 123 mg/dL — ABNORMAL HIGH (ref 70–99)
Glucose-Capillary: 127 mg/dL — ABNORMAL HIGH (ref 70–99)
Glucose-Capillary: 127 mg/dL — ABNORMAL HIGH (ref 70–99)
Glucose-Capillary: 128 mg/dL — ABNORMAL HIGH (ref 70–99)
Glucose-Capillary: 134 mg/dL — ABNORMAL HIGH (ref 70–99)
Glucose-Capillary: 137 mg/dL — ABNORMAL HIGH (ref 70–99)
Glucose-Capillary: 151 mg/dL — ABNORMAL HIGH (ref 70–99)

## 2023-05-12 LAB — SURGICAL PATHOLOGY

## 2023-05-12 MED ORDER — KETOROLAC TROMETHAMINE 15 MG/ML IJ SOLN
15.0000 mg | Freq: Four times a day (QID) | INTRAMUSCULAR | Status: DC | PRN
  Administered 2023-05-12 (×2): 15 mg via INTRAVENOUS
  Filled 2023-05-12 (×2): qty 1

## 2023-05-12 MED ORDER — FE FUM-VIT C-VIT B12-FA 460-60-0.01-1 MG PO CAPS
1.0000 | ORAL_CAPSULE | Freq: Every day | ORAL | Status: DC
  Administered 2023-05-12 – 2023-05-16 (×5): 1 via ORAL
  Filled 2023-05-12 (×5): qty 1

## 2023-05-12 MED ORDER — INSULIN GLARGINE 100 UNIT/ML ~~LOC~~ SOLN
10.0000 [IU] | Freq: Every day | SUBCUTANEOUS | Status: DC
  Administered 2023-05-12 – 2023-05-13 (×2): 10 [IU] via SUBCUTANEOUS
  Filled 2023-05-12 (×3): qty 0.1

## 2023-05-12 MED ORDER — INSULIN GLARGINE-YFGN 100 UNIT/ML ~~LOC~~ SOLN
10.0000 [IU] | Freq: Every day | SUBCUTANEOUS | Status: DC
  Filled 2023-05-12: qty 0.1

## 2023-05-12 MED ORDER — ASPIRIN 81 MG PO TBEC
81.0000 mg | DELAYED_RELEASE_TABLET | Freq: Every day | ORAL | Status: DC
  Administered 2023-05-13 – 2023-05-16 (×4): 81 mg via ORAL
  Filled 2023-05-12 (×4): qty 1

## 2023-05-12 MED ORDER — INSULIN ASPART 100 UNIT/ML IJ SOLN
0.0000 [IU] | INTRAMUSCULAR | Status: DC
  Administered 2023-05-12 – 2023-05-13 (×5): 2 [IU] via SUBCUTANEOUS

## 2023-05-12 MED ORDER — FUROSEMIDE 10 MG/ML IJ SOLN
40.0000 mg | Freq: Two times a day (BID) | INTRAMUSCULAR | Status: AC
  Administered 2023-05-12 (×2): 40 mg via INTRAVENOUS
  Filled 2023-05-12 (×2): qty 4

## 2023-05-12 MED ORDER — POTASSIUM CHLORIDE CRYS ER 20 MEQ PO TBCR
20.0000 meq | EXTENDED_RELEASE_TABLET | ORAL | Status: AC
  Administered 2023-05-12 – 2023-05-13 (×3): 20 meq via ORAL
  Filled 2023-05-12 (×3): qty 1

## 2023-05-12 MED ORDER — POTASSIUM CHLORIDE CRYS ER 20 MEQ PO TBCR
20.0000 meq | EXTENDED_RELEASE_TABLET | Freq: Two times a day (BID) | ORAL | Status: AC
  Administered 2023-05-12 (×2): 20 meq via ORAL
  Filled 2023-05-12 (×2): qty 1

## 2023-05-12 MED FILL — Thrombin (Recombinant) For Soln 20000 Unit: CUTANEOUS | Qty: 1 | Status: AC

## 2023-05-12 MED FILL — Electrolyte-R (PH 7.4) Solution: INTRAVENOUS | Qty: 5000 | Status: AC

## 2023-05-12 MED FILL — Lidocaine HCl Local Preservative Free (PF) Inj 2%: INTRAMUSCULAR | Qty: 14 | Status: AC

## 2023-05-12 MED FILL — Heparin Sodium (Porcine) Inj 1000 Unit/ML: Qty: 1000 | Status: AC

## 2023-05-12 MED FILL — Heparin Sodium (Porcine) Inj 1000 Unit/ML: INTRAMUSCULAR | Qty: 10 | Status: AC

## 2023-05-12 MED FILL — Sodium Chloride IV Soln 0.9%: INTRAVENOUS | Qty: 3000 | Status: AC

## 2023-05-12 MED FILL — Potassium Chloride Inj 2 mEq/ML: INTRAVENOUS | Qty: 40 | Status: AC

## 2023-05-12 MED FILL — Mannitol IV Soln 20%: INTRAVENOUS | Qty: 500 | Status: AC

## 2023-05-12 NOTE — Plan of Care (Signed)
  Problem: Activity: Goal: Risk for activity intolerance will decrease Outcome: Progressing   Problem: Nutrition: Goal: Adequate nutrition will be maintained Outcome: Progressing   Problem: Coping: Goal: Level of anxiety will decrease Outcome: Progressing   Problem: Elimination: Goal: Will not experience complications related to urinary retention Outcome: Progressing   Problem: Pain Managment: Goal: General experience of comfort will improve and/or be controlled Outcome: Progressing

## 2023-05-12 NOTE — Progress Notes (Signed)
   05/12/23 2142  BiPAP/CPAP/SIPAP  $ Non-Invasive Home Ventilator  Initial  BiPAP/CPAP/SIPAP Pt Type Adult  BiPAP/CPAP/SIPAP Resmed  Mask Type Nasal mask (from home)  EPAP 5 cmH2O  Flow Rate 4 lpm

## 2023-05-12 NOTE — Progress Notes (Signed)
 Patient ID: Thomas [name], male   DOB: 1956/02/12, 68 y.o.   MRN: [id number]  TCTS Evening Rounds:  Hemodynamically stable in sinus rhythm 60's.   UO good  CT output low.  Ambulated short distance.   BMET    Component Value Date/Time   NA 135 05/12/2023 1431   NA 137 10/14/2022 1058   K 3.7 05/12/2023 1431   CL 103 05/12/2023 1431   CO2 28 05/12/2023 1431   GLUCOSE 130 (H) 05/12/2023 1431   BUN 17 05/12/2023 1431   BUN 22 10/14/2022 1058   CREATININE 1.00 05/12/2023 1431   CALCIUM 8.1 (L) 05/12/2023 1431   EGFR 94 10/14/2022 1058   GFRNONAA >60 05/12/2023 1431   CBC    Component Value Date/Time   WBC 8.2 05/12/2023 1431   RBC 3.25 (L) 05/12/2023 1431   HGB 9.9 (L) 05/12/2023 1431   HGB 15.7 10/14/2022 1058   HCT 29.5 (L) 05/12/2023 1431   HCT 47.8 10/14/2022 1058   PLT 96 (L) 05/12/2023 1431   PLT 167 10/14/2022 1058   MCV 90.8 05/12/2023 1431   MCV 91 10/14/2022 1058   MCH 30.5 05/12/2023 1431   MCHC 33.6 05/12/2023 1431   RDW 13.8 05/12/2023 1431   RDW 12.9 10/14/2022 1058   LYMPHSABS 1.8 06/10/2021 1529   MONOABS 0.9 05/05/2009 1341   EOSABS 0.1 06/10/2021 1529   BASOSABS 0.1 06/10/2021 1529

## 2023-05-12 NOTE — Progress Notes (Signed)
 Post op echo ordered

## 2023-05-12 NOTE — Discharge Summary (Signed)
 Physician Discharge Summary  Patient ID: Thomas [name] MRN: [id number] DOB/AGE: [birth year] 67 y.o.  Admit date: 05/11/2023 Discharge date: 05/16/2023  Admission Diagnoses:  Severe aortic stenosis Hypertension Dyslipidemia History of prostate cancer History of anxiety Arthritis Obesity  Discharge Diagnoses:   Severe aortic stenosis Hypertension Dyslipidemia History of prostate cancer History of anxiety Arthritis Obesity Post-operative thrombocytopenia Expected acute blood loss anemia  Discharged Condition: good  PCP is Emilio Aspen, MD Referring Provider is Little Ishikawa  History of Present Illness:  Thomas [name] is a 68 year old gentleman with history of hypertension, hyperlipidemia, prostate cancer status post robotic assisted radical prostatectomy in 2016, and aortic stenosis followed by Dr. Bjorn Pippin.  His most recent echocardiogram on 10/01/2022 showed a trileaflet aortic valve with severe calcification and thickening.  The mean gradient had increased to 47 mmHg with a peak of 80 mmHg and a valve area by VTI of 0.84 cm.  Dimensionless index was 0.22.  Left ventricular ejection fraction was 60 to 65% with moderate concentric LVH and grade 1 diastolic dysfunction.  He was referred to Dr. Clifton James for valve clinic evaluation and underwent cardiac catheterization on 10/19/2022.  This showed mild nonobstructive disease in the LAD and otherwise normal coronary arteries.  Right heart pressures were normal with a PA pressure of 32/14 and a wedge pressure of 8.  The mean gradient across the aortic valve at cath was measured at 28.5 mmHg.  He underwent a gated cardiac CTA showing an aortic valve calcium score of 1702.  The valve was severely thickened and functionally bicuspid with a raphae between the left and right cusps.  There was severe calcification and reduced leaflet excursion.  There was no ascending aortic aneurysm.  He was seen by Dr. Bjorn Pippin on 03/02/2023  and reported onset of shortness of breath with exertion.  He is also reported some vague left-sided chest pressure which he says is present most of the time.  He denies any dizziness or syncope.  He has had no peripheral edema.  He denies orthopnea and PND.  He was referred to [facility] and evaluated by Dr. Laneta Simmers who was in agreement the patient would benefit from Aortic Valve Replacement.  The risks and benefits of the procedure was explained to the patient and he was agreeable to proceed.  Hospital Course:  [name] presented to [facility] on 05/11/2023.  He was taken to the operating room and underwent Aortic Valve Replacement with a 23mm Inspiris Resilia bioprosthetic valve.  Following the procedure he separated from cardiopulmonary bypass without dificulty and required no inotropes. He was transferred to the ICU in stable condition. He remained stable and was weaned from the mechanical ventilator and extubated by 6:30pm on the day of surgery.  The monitoring lines were removed on post-op day 1 and diuresis was begun. He was mobilized. Lovenox was held due to moderate thrombocytopenia. He had an accelerated junctional rhythm that resolved with time. His epicardial pacing wires were removed on 3/1. He was felt stable for transfer to the progressive unit.  His platelet count recovered without intervention.  He continues to maintain NSR.  He is ambulating independently.  His surgical incisions are healing without evidence of infection.  He is felt stable for discharge home today.  Consults: None  Significant Diagnostic Studies: cardiac graphics:   Echocardiogram:    IMPRESSIONS    1. Left ventricular ejection fraction, by estimation, is 60 to 65%. The  left ventricle has normal function.  The left ventricle has no regional  wall motion abnormalities. There is moderate concentric left ventricular  hypertrophy. Left ventricular  diastolic parameters are  consistent with Grade I diastolic dysfunction  (impaired relaxation).   2. Right ventricular systolic function is normal. The right ventricular  size is normal. There is normal pulmonary artery systolic pressure. The  estimated right ventricular systolic pressure is 28.6 mmHg.   3. Left atrial size was mild to moderately dilated.   4. The mitral valve is normal in structure. Trivial mitral valve  regurgitation. No evidence of mitral stenosis.   5. The aortic valve is tricuspid. There is severe calcifcation of the  aortic valve. Aortic valve regurgitation is not visualized. Severe aortic  valve stenosis. Aortic valve area, by VTI measures 0.84 cm. Aortic valve  mean gradient measures 47.0 mmHg.   6. The inferior vena cava is normal in size with greater than 50%  respiratory variability, suggesting right atrial pressure of 3 mmHg.    Treatments:  CARDIOVASCULAR SURGERY OPERATIVE NOTE   05/11/2023 CLARA SMOLEN [id number]   Surgeon:  Alleen Borne, MD   First Assistant: Jayme Cloud, MD, [facility]     Preoperative Diagnosis:  Severe aortic stenosis     Postoperative Diagnosis:  Same     Procedure:   Median Sternotomy Extracorporeal circulation 3.   Aortic valve replacement using a 23 mm INSPIRIS RESILIA pericardial valve.   Anesthesia:  General Endotracheal  Discharge Exam: by Dr. Cliffton Asters Blood pressure (!) 147/88, pulse 80, temperature 97.7 F (36.5 C), temperature source Oral, resp. rate 15, height 5\' 11"  (1.803 m), weight 123 kg, SpO2 92%.  Neuro: pain controlled CV: stable.  Will remove pacing wires Pulm: IS, ambulation Renal: creat stable.  Back to baseline weight GI: on diet Heme: stable ID: afebrile Endo: SSI Dispo: home today  Disposition:   Discharge Instructions     Amb Referral to Cardiac Rehabilitation   Complete by: As directed    Diagnosis: Valve Replacement   Valve: Aortic   After initial evaluation and assessments completed:  Virtual Based Care may be provided alone or in conjunction with Phase 2 Cardiac Rehab based on patient barriers.: Yes   Intensive Cardiac Rehabilitation (ICR) [facility] location only OR Traditional Cardiac Rehabilitation (TCR) *If criteria for ICR are not met will enroll in TCR [facility] only): Yes      Allergies as of 05/16/2023       Reactions   Amoxicillin-pot Clavulanate Other (See Comments), Diarrhea        Medication List     STOP taking these medications    amLODipine 10 MG tablet Commonly known as: NORVASC   hydrALAZINE 10 MG tablet Commonly known as: APRESOLINE   hydrochlorothiazide 25 MG tablet Commonly known as: HYDRODIURIL       TAKE these medications    acetaminophen 500 MG tablet Commonly known as: TYLENOL Take 1-2 tablets (500-1,000 mg total) by mouth every 6 (six) hours as needed.   aspirin EC 81 MG tablet Take 81 mg by mouth daily. Swallow whole.   Biotin 1000 MCG tablet Take 1,000 mcg by mouth daily.   Co Q 10 100 MG Caps Take 100 mg by mouth daily.   ezetimibe 10 MG tablet Commonly known as: ZETIA TAKE ONE TABLET BY MOUTH ONE TIME DAILY   fluticasone 50 MCG/ACT nasal spray Commonly known as: FLONASE Place 1 spray into both nostrils at bedtime.   Garlic 1000 MG Caps Take 1,000 mg by mouth daily.  GLUCOSAMINE COMPLEX -BOSWELLIA PO Take 1,500 mg by mouth daily.   metoprolol tartrate 25 MG tablet Commonly known as: LOPRESSOR Take 0.5 tablets (12.5 mg total) by mouth 2 (two) times daily.   multivitamin with minerals tablet Take 1 tablet by mouth daily. Men   olmesartan 20 MG tablet Commonly known as: BENICAR Take 1 tablet (20 mg total) by mouth daily.   OMEGA 3 PO Take 1,500 mg by mouth daily.   rosuvastatin 40 MG tablet Commonly known as: CRESTOR TAKE ONE TABLET BY MOUTH ONE TIME DAILY   traMADol 50 MG tablet Commonly known as: ULTRAM Take 1 tablet (50 mg total) by mouth every 6 (six) hours as needed for moderate pain (pain score  4-6).   Vitamin D-1000 Max St 25 MCG (1000 UT) tablet Generic drug: Cholecalciferol Take 1,000 Units by mouth daily.   vortioxetine HBr 20 MG Tabs tablet Commonly known as: Trintellix Take 1 tablet (20 mg total) by mouth daily.        Follow-up Information     [facility] [place]. Go on 06/09/2023.   Specialty: Cardiothoracic Surgery Why: Your appointment is at 3pm Please obtain a chest x-ray 1 hour before the appointment at [facility] located at [address] Contact information: [address] [place], [address] [place] [address] [phone]        Little Ishikawa, MD. Go on 06/16/2023.   Specialties: Cardiology, Radiology Why: Your follow up appontment is at 9am. Contact information: [address] [place] [address] [phone]         [facility]. Go on 05/26/2023.   Specialty: Cardiothoracic Surgery Why: Your appointment for suture removal is 11:00am. Contact information: [address] [place], [address] [place] [address] [phone]        [facility] Follow up.   Why: Home health agency will call to arrange visit Contact information: [phone]        [facility] Follow up on 06/09/2023.   Why: To get a CXR at 2:00PM prior to your appointment Contact information: [address] [place] [address]        [facility] Follow up on 06/22/2023.   Specialty: Cardiology Why: To get echocardiogram at 9:30AM Contact information: [address] [place] [address] [phone]                The patient has been discharged on:   1.Beta Blocker:  Yes [  x ]                              No   [   ]                              If No, reason:  2.Ace Inhibitor/ARB: Yes [ x  ]                                     No  [     ]  If No, reason:  3.Statin:   Yes [x  ]                  No  [   ]                  If No, reason:  4.Ecasa:  Yes  [ x ]                  No   [   ]                  If No, reason:  5. ACS on Admission? No  P2Y12 Inhibitor:  Yes  [   ]                                No  [x ]    Signed: Koi Zangara, PA-C 05/16/2023, 12:10 PM

## 2023-05-12 NOTE — TOC Initial Note (Signed)
 Transition of Care [facility]) - Initial/Assessment Note    Patient Details  Name: Thomas [name] MRN: [id number] Date of Birth: 1955-10-19  Transition of Care [facility]) CM/SW Contact:    Lawerance Sabal, RN Phone Number: 05/12/2023, 12:06 PM  Clinical Narrative:                  Patient admitted from home, lives w wife.  CABG done yesterday, continues recovery in ICU. Notified by [facility] liaison that they are following patient with TCTS office protocol referral prearranged for any [facility] needs. TOC will continue to follow, MD please place [facility] orders with face to face as needed.    Barriers to Discharge: Continued Medical Work up   Patient Goals and CMS Choice            Expected Discharge Plan and Services In-house Referral: Clinical Social Work Discharge Planning Services: CM Consult   Living arrangements for the past 2 months: Single Family Home                                      Prior Living Arrangements/Services Living arrangements for the past 2 months: Single Family Home Lives with:: Spouse                   Activities of Daily Living   ADL Screening (condition at time of admission) Independently performs ADLs?: Yes (appropriate for developmental age) Is the patient deaf or have difficulty hearing?: No Does the patient have difficulty seeing, even when wearing glasses/contacts?: No Does the patient have difficulty concentrating, remembering, or making decisions?: No  Permission Sought/Granted                  Emotional Assessment              Admission diagnosis:  Aortic stenosis [I35.0] S/P AVR (aortic valve replacement) [Z95.2] Patient Active Problem List   Diagnosis Date Noted   Aortic stenosis 05/11/2023   S/P AVR (aortic valve replacement) 05/11/2023   Moderate aortic stenosis 07/04/2021   Mood disorder (HCC) 06/10/2021   Fatigue 08/26/2020   Snoring 08/26/2020   PVC (premature ventricular contraction) 08/26/2020   Morbid obesity  (HCC) 08/26/2020   Frequent PVCs 06/26/2020   Other fatigue 06/26/2020   Hypertension    Hyperlipemia    Palpitations 05/29/2020   Adjustment disorder 05/21/2020   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   Altered bowel function 05/21/2020   Benign prostatic hyperplasia without lower urinary tract symptoms 05/21/2020   Cellulitis and abscess of neck 05/21/2020   Chest pain at rest 05/21/2020   Elevated PSA 05/21/2020   Encounter for general adult medical examination with abnormal findings 05/21/2020   Frequency of micturition 05/21/2020   Hearing loss 05/21/2020   Myalgia 05/21/2020   Obesity 05/21/2020   Other long term (current) drug therapy 05/21/2020   Pure hypercholesterolemia 05/21/2020   Sciatica 05/21/2020   Sebaceous cyst 05/21/2020   Systolic murmur 05/21/2020   Tinnitus 05/21/2020   Vitamin D deficiency 05/21/2020   Urgency of urination 05/21/2020   Aortic valve stenosis 05/21/2020   Obesity (BMI 30-39.9) 05/21/2020   Nonrheumatic aortic valve stenosis 06/08/2019   Coronary artery disease involving native coronary artery of native heart without angina pectoris 06/08/2019   Pneumonia    Lower urinary tract symptoms (LUTS)    Essential hypertension    Mixed hyperlipidemia    History of anxiety  GERD (gastroesophageal reflux disease)    Arthritis    Anxiety    Anemia    Ventral hernia without obstruction or gangrene 03/27/2016   Prostate cancer (HCC) 05/17/2014   Malignant neoplasm of prostate (HCC) 04/17/2014   PCP:  Emilio Aspen, MD Pharmacy:   [facility] [phone] - [place], [place] - [address] AT CORNER OF [address] [place] [address] Phone: [phone] Fax: [phone]  [facility] [address] [place], [place] - [address] [place] [facility] [place]. [place] [address] Phone: [phone] Fax: [phone]     Social Drivers of Health  (SDOH) Social History: SDOH Screenings   Food Insecurity: No Food Insecurity (05/11/2023)  Housing: Patient Unable To Answer (05/11/2023)  Transportation Needs: Patient Unable To Answer (05/11/2023)  Utilities: Patient Unable To Answer (05/11/2023)  Depression (PHQ2-9): High Risk (06/10/2021)  Social Connections: Patient Unable To Answer (05/11/2023)  Tobacco Use: Low Risk  (05/11/2023)   SDOH Interventions:     Readmission Risk Interventions     No data to display

## 2023-05-12 NOTE — Progress Notes (Signed)
 1 Day Post-Op Procedure(s) (LRB): AORTIC VALVE REPLACEMENT USING INSPIRIS RESILIA AORTIC VALVE SIZE 23 MM (N/A) TRANSESOPHAGEAL ECHOCARDIOGRAM (TEE) (N/A) Subjective: No specific complaints.  Objective: Vital signs in last 24 hours: Temp:  [98.2 F (36.8 C)-99.7 F (37.6 C)] 98.4 F (36.9 C) (02/26 0630) Pulse Rate:  [45-83] 79 (02/26 0630) Cardiac Rhythm: Atrial paced (02/26 0000) Resp:  [8-29] 17 (02/26 0630) BP: (80-130)/(57-83) 115/66 (02/26 0600) SpO2:  [85 %-100 %] 92 % (02/26 0630) Arterial Line BP: (70-139)/(39-77) 129/55 (02/26 0615) FiO2 (%):  [40 %-50 %] 40 % (02/25 1750) Weight:  [132.5 kg] 132.5 kg (02/26 0630)  Hemodynamic parameters for last 24 hours: PAP: (16-52)/(2-30) 37/18 CVP:  [15 mmHg-16 mmHg] 16 mmHg CO:  [3.4 L/min-9.5 L/min] 9.5 L/min CI:  [1.4 L/min/m2-3.9 L/min/m2] 3.9 L/min/m2  Intake/Output from previous day: 02/25 0701 - 02/26 0700 In: 7135.4 [P.O.:480; I.V.:3431; Blood:1240.7; IV Piggyback:1983.7] Out: 3870 [Urine:1600; Blood:1380; Chest Tube:890] Intake/Output this shift: Total I/O In: 842.7 [P.O.:480; Blood:362.7] Out: 835 [Urine:545; Chest Tube:290]  General appearance: alert and cooperative Neurologic: intact Heart: regular rate and rhythm Lungs: clear to auscultation bilaterally Extremities: edema moderate Wound: dressing dry  Lab Results: Recent Labs    05/11/23 2033 05/12/23 0426  WBC 8.3 6.6  HGB 10.2* 9.6*  HCT 30.3* 28.6*  PLT 98* 86*   BMET:  Recent Labs    05/11/23 2033 05/12/23 0426  NA 138 138  K 4.2 4.3  CL 107 106  CO2 23 26  GLUCOSE 164* 131*  BUN 19 19  CREATININE 1.06 0.98  CALCIUM 8.1* 8.1*    PT/INR:  Recent Labs    05/11/23 1445  LABPROT 17.6*  INR 1.4*   ABG    Component Value Date/Time   PHART 7.330 (L) 05/11/2023 1944   HCO3 23.6 05/11/2023 1944   TCO2 25 05/11/2023 1944   ACIDBASEDEF 2.0 05/11/2023 1944   O2SAT 94 05/11/2023 1944   CBG (last 3)  Recent Labs    05/12/23 0222  05/12/23 0426 05/12/23 0645  GLUCAP 127* 127* 128*   CXR: clear  ECG pending Assessment/Plan: S/P Procedure(s) (LRB): AORTIC VALVE REPLACEMENT USING INSPIRIS RESILIA AORTIC VALVE SIZE 23 MM (N/A) TRANSESOPHAGEAL ECHOCARDIOGRAM (TEE) (N/A)  POD 1 Hemodynamically stable in sinus rhythm 60's. Will DC Lopressor for now and atrial pace as needed.   Wt is 14 lbs over preop. Start diuresis.  Keep chest tubes in today.  DC swan, arterial line.  Thrombocytopenia: No lovenox.  Glucose under good control. Transition to [facility] and SSI. Preop Hgb A1c was 5.7  IS, OOB.  Add Toradol for pain control.   LOS: 1 day    Alleen Borne 05/12/2023

## 2023-05-12 NOTE — Discharge Instructions (Signed)
 Discharge Instructions:  1. You may shower, please wash incisions daily with soap and water and keep dry.  If you wish to cover wounds with dressing you may do so but please keep clean and change daily.  No tub baths or swimming until incisions have completely healed.  If your incisions become red or develop any drainage please call our office at [phone]  2. No Driving until cleared by Dr. Sharee Pimple office and you are no longer using narcotic pain medications  3. Monitor your weight daily.. Please use the same scale and weigh at same time... If you gain 5-10 lbs in 48 hours with associated lower extremity swelling, please contact our office at [phone]  4. Fever of 101.5 for at least 24 hours with no source, please contact our office at [phone]  5. Activity- up as tolerated, please walk at least 3 times per day.  Avoid strenuous activity, no lifting, pushing, or pulling with your arms over 8-10 lbs for a minimum of 6 weeks  6. If any questions or concerns arise, please do not hesitate to contact our office at [phone]

## 2023-05-12 NOTE — Plan of Care (Signed)
   Problem: Clinical Measurements: Goal: Respiratory complications will improve Outcome: Progressing Goal: Cardiovascular complication will be avoided Outcome: Progressing   Problem: Activity: Goal: Risk for activity intolerance will decrease Outcome: Progressing   Problem: Nutrition: Goal: Adequate nutrition will be maintained Outcome: Progressing

## 2023-05-13 ENCOUNTER — Inpatient Hospital Stay (HOSPITAL_COMMUNITY): Payer: Medicare Other

## 2023-05-13 LAB — CBC
HCT: 28.2 % — ABNORMAL LOW (ref 39.0–52.0)
Hemoglobin: 9.3 g/dL — ABNORMAL LOW (ref 13.0–17.0)
MCH: 29.9 pg (ref 26.0–34.0)
MCHC: 33 g/dL (ref 30.0–36.0)
MCV: 90.7 fL (ref 80.0–100.0)
Platelets: 78 10*3/uL — ABNORMAL LOW (ref 150–400)
RBC: 3.11 MIL/uL — ABNORMAL LOW (ref 4.22–5.81)
RDW: 13.9 % (ref 11.5–15.5)
WBC: 5.9 10*3/uL (ref 4.0–10.5)
nRBC: 0 % (ref 0.0–0.2)

## 2023-05-13 LAB — GLUCOSE, CAPILLARY
Glucose-Capillary: 118 mg/dL — ABNORMAL HIGH (ref 70–99)
Glucose-Capillary: 121 mg/dL — ABNORMAL HIGH (ref 70–99)
Glucose-Capillary: 123 mg/dL — ABNORMAL HIGH (ref 70–99)
Glucose-Capillary: 127 mg/dL — ABNORMAL HIGH (ref 70–99)
Glucose-Capillary: 130 mg/dL — ABNORMAL HIGH (ref 70–99)
Glucose-Capillary: 159 mg/dL — ABNORMAL HIGH (ref 70–99)

## 2023-05-13 LAB — BASIC METABOLIC PANEL
Anion gap: 9 (ref 5–15)
BUN: 21 mg/dL (ref 8–23)
CO2: 25 mmol/L (ref 22–32)
Calcium: 8.5 mg/dL — ABNORMAL LOW (ref 8.9–10.3)
Chloride: 100 mmol/L (ref 98–111)
Creatinine, Ser: 0.97 mg/dL (ref 0.61–1.24)
GFR, Estimated: >60 mL/min (ref >60)
Glucose, Bld: 117 mg/dL — ABNORMAL HIGH (ref 70–99)
Potassium: 4.3 mmol/L (ref 3.5–5.1)
Sodium: 134 mmol/L — ABNORMAL LOW (ref 135–145)

## 2023-05-13 MED ORDER — POTASSIUM CHLORIDE CRYS ER 20 MEQ PO TBCR
20.0000 meq | EXTENDED_RELEASE_TABLET | Freq: Two times a day (BID) | ORAL | Status: AC
  Administered 2023-05-13 (×2): 20 meq via ORAL
  Filled 2023-05-13 (×2): qty 1

## 2023-05-13 MED ORDER — INSULIN ASPART 100 UNIT/ML IJ SOLN
0.0000 [IU] | Freq: Three times a day (TID) | INTRAMUSCULAR | Status: DC
  Administered 2023-05-13 (×2): 2 [IU] via SUBCUTANEOUS

## 2023-05-13 MED ORDER — FUROSEMIDE 10 MG/ML IJ SOLN
40.0000 mg | Freq: Two times a day (BID) | INTRAMUSCULAR | Status: AC
  Administered 2023-05-13 (×2): 40 mg via INTRAVENOUS
  Filled 2023-05-13 (×2): qty 4

## 2023-05-13 NOTE — TOC Progression Note (Signed)
 Transition of Care [facility]) - Progression Note    Patient Details  Name: Thomas [name] MRN: [id number] Date of Birth: [birth year]  Transition [facility]) CM/SW Contact  Elliot Cousin, RN Phone Number: [phone] 05/13/2023, 5:23 PM  Clinical Narrative:     TOC CM spoke to pt's son at bedside. Pt lives at home with spouse. Pt was independent pta. HH preoperatively arranged with [facility]. Medicare.gov list placed on chart and provided to pt. Will continue to follow for [facility] and DME needs.     Expected Discharge Plan: Home w Home Health Services Barriers to Discharge: Continued Medical Work up  Expected Discharge Plan and Services In-house Referral: Clinical Social Work Discharge Planning Services: CM Consult Post Acute Care Choice: Home Health Living arrangements for the past 2 months: Single Family Home                           HH Arranged: RN HH Agency: [facility]         Social Determinants of Health (SDOH) Interventions SDOH Screenings   Food Insecurity: No Food Insecurity (05/11/2023)  Housing: Patient Unable To Answer (05/11/2023)  Transportation Needs: Patient Unable To Answer (05/11/2023)  Utilities: Patient Unable To Answer (05/11/2023)  Depression (PHQ2-9): High Risk (06/10/2021)  Social Connections: Patient Unable To Answer (05/11/2023)  Tobacco Use: Low Risk  (05/11/2023)    Readmission Risk Interventions     No data to display

## 2023-05-13 NOTE — Progress Notes (Signed)
 2 Days Post-Op Procedure(s) (LRB): AORTIC VALVE REPLACEMENT USING INSPIRIS RESILIA AORTIC VALVE SIZE 23 MM (N/A) TRANSESOPHAGEAL ECHOCARDIOGRAM (TEE) (N/A) Subjective: No complaints. Ambulated some last night but not yet this am.  Still sleepy from pain meds.  Objective: Vital signs in last 24 hours: Temp:  [98 F (36.7 C)-98.9 F (37.2 C)] 98.6 F (37 C) (02/27 0400) Pulse Rate:  [55-80] 61 (02/27 0700) Cardiac Rhythm: Sinus bradycardia (02/27 0400) Resp:  [13-19] 17 (02/27 0700) BP: (104-136)/(60-92) 104/71 (02/27 0700) SpO2:  [90 %-99 %] 93 % (02/27 0700) Arterial Line BP: (132)/(52) 132/52 (02/26 0800) Weight:  [131.8 kg] 131.8 kg (02/27 0411)  Hemodynamic parameters for last 24 hours:    Intake/Output from previous day: 02/26 0701 - 02/27 0700 In: 1072.9 [P.O.:480; I.V.:592.9] Out: 2875 [Urine:2725; Chest Tube:150] Intake/Output this shift: No intake/output data recorded.  General appearance: alert and cooperative Neurologic: intact Heart: regular rate and rhythm Lungs: clear to auscultation bilaterally Extremities: edema moderate Wound: dressing dry  Lab Results: Recent Labs    05/12/23 1431 05/13/23 0357  WBC 8.2 5.9  HGB 9.9* 9.3*  HCT 29.5* 28.2*  PLT 96* 78*   BMET:  Recent Labs    05/12/23 1431 05/13/23 0357  NA 135 134*  K 3.7 4.3  CL 103 100  CO2 28 25  GLUCOSE 130* 117*  BUN 17 21  CREATININE 1.00 0.97  CALCIUM 8.1* 8.5*    PT/INR:  Recent Labs    05/11/23 1445  LABPROT 17.6*  INR 1.4*   ABG    Component Value Date/Time   PHART 7.330 (L) 05/11/2023 1944   HCO3 23.6 05/11/2023 1944   TCO2 25 05/11/2023 1944   ACIDBASEDEF 2.0 05/11/2023 1944   O2SAT 94 05/11/2023 1944   CBG (last 3)  Recent Labs    05/12/23 2012 05/13/23 0013 05/13/23 0408  GLUCAP 151* 130* 127*   CXR: ok  ECG: accelerated junctional 66.  Assessment/Plan: S/P Procedure(s) (LRB): AORTIC VALVE REPLACEMENT USING INSPIRIS RESILIA AORTIC VALVE SIZE 23  MM (N/A) TRANSESOPHAGEAL ECHOCARDIOGRAM (TEE) (N/A)  POD 2  Hemodynamically stable. Rhythm is sinus at times but some loss of p-waves with accelerated junctional. Will hold off on beta blocker and continue AAI pacing to maximize hemodynamics while diuresing. This should resolve with time.  -1800 cc yesterday. Wt still 12 lbs over preop. Continue diuresis and KCL.  DC chest tubes, sleeve.  Glucose ok on SEMGLEE 10 and SSI. Preop Hgb A1c was 5.7 on no meds. Should be able to stop this in a couple days.  Sleepy with narcotics. Continue to use Toradol for pain preferentially.  Continue IS, ambulation.    LOS: 2 days    Alleen Borne 05/13/2023

## 2023-05-14 ENCOUNTER — Inpatient Hospital Stay (HOSPITAL_COMMUNITY): Payer: Medicare Other

## 2023-05-14 LAB — CBC
HCT: 29.3 % — ABNORMAL LOW (ref 39.0–52.0)
Hemoglobin: 9.9 g/dL — ABNORMAL LOW (ref 13.0–17.0)
MCH: 30.3 pg (ref 26.0–34.0)
MCHC: 33.8 g/dL (ref 30.0–36.0)
MCV: 89.6 fL (ref 80.0–100.0)
Platelets: 112 10*3/uL — ABNORMAL LOW (ref 150–400)
RBC: 3.27 MIL/uL — ABNORMAL LOW (ref 4.22–5.81)
RDW: 13.4 % (ref 11.5–15.5)
WBC: 7.7 10*3/uL (ref 4.0–10.5)
nRBC: 0 % (ref 0.0–0.2)

## 2023-05-14 LAB — BASIC METABOLIC PANEL
Anion gap: 3 — ABNORMAL LOW (ref 5–15)
BUN: 19 mg/dL (ref 8–23)
CO2: 32 mmol/L (ref 22–32)
Calcium: 8.9 mg/dL (ref 8.9–10.3)
Chloride: 103 mmol/L (ref 98–111)
Creatinine, Ser: 0.96 mg/dL (ref 0.61–1.24)
GFR, Estimated: >60 mL/min (ref >60)
Glucose, Bld: 118 mg/dL — ABNORMAL HIGH (ref 70–99)
Potassium: 4.4 mmol/L (ref 3.5–5.1)
Sodium: 138 mmol/L (ref 135–145)

## 2023-05-14 LAB — GLUCOSE, CAPILLARY: Glucose-Capillary: 106 mg/dL — ABNORMAL HIGH (ref 70–99)

## 2023-05-14 MED ORDER — METOPROLOL TARTRATE 12.5 MG HALF TABLET
12.5000 mg | ORAL_TABLET | Freq: Two times a day (BID) | ORAL | Status: DC
  Administered 2023-05-14 – 2023-05-16 (×5): 12.5 mg via ORAL
  Filled 2023-05-14 (×5): qty 1

## 2023-05-14 MED ORDER — POTASSIUM CHLORIDE CRYS ER 20 MEQ PO TBCR
20.0000 meq | EXTENDED_RELEASE_TABLET | Freq: Three times a day (TID) | ORAL | Status: AC
  Administered 2023-05-14 (×3): 20 meq via ORAL
  Filled 2023-05-14 (×3): qty 1

## 2023-05-14 MED ORDER — POTASSIUM CHLORIDE CRYS ER 20 MEQ PO TBCR
20.0000 meq | EXTENDED_RELEASE_TABLET | Freq: Every day | ORAL | Status: DC
  Administered 2023-05-15 – 2023-05-16 (×2): 20 meq via ORAL
  Filled 2023-05-14 (×2): qty 1

## 2023-05-14 MED ORDER — METOLAZONE 2.5 MG PO TABS
2.5000 mg | ORAL_TABLET | Freq: Once | ORAL | Status: AC
  Administered 2023-05-14: 2.5 mg via ORAL
  Filled 2023-05-14: qty 1

## 2023-05-14 MED ORDER — FUROSEMIDE 40 MG PO TABS
40.0000 mg | ORAL_TABLET | Freq: Every day | ORAL | Status: DC
  Administered 2023-05-14: 40 mg via ORAL
  Filled 2023-05-14: qty 1

## 2023-05-14 NOTE — Progress Notes (Signed)
 3 Days Post-Op Procedure(s) (LRB): AORTIC VALVE REPLACEMENT USING INSPIRIS RESILIA AORTIC VALVE SIZE 23 MM (N/A) TRANSESOPHAGEAL ECHOCARDIOGRAM (TEE) (N/A) Subjective: No complaints. Ambulated around ICU 2 laps. Passing gas but no BM yet.  Objective: Vital signs in last 24 hours: Temp:  [97.8 F (36.6 C)-99.2 F (37.3 C)] 97.8 F (36.6 C) (02/28 0300) Pulse Rate:  [65-86] 80 (02/28 0700) Cardiac Rhythm: Atrial paced (02/27 2100) Resp:  [13-32] 17 (02/28 0700) BP: (120-153)/(67-96) 127/74 (02/28 0700) SpO2:  [87 %-99 %] 95 % (02/28 0700) Weight:  [128.7 kg] 128.7 kg (02/28 0500)  Hemodynamic parameters for last 24 hours:    Intake/Output from previous day: 02/27 0701 - 02/28 0700 In: 360 [P.O.:360] Out: 5230 [Urine:5200; Chest Tube:30] Intake/Output this shift: No intake/output data recorded.  General appearance: alert and cooperative Neurologic: intact Heart: regular rate and rhythm, S1, S2 normal, no murmur Lungs: clear to auscultation bilaterally Extremities: edema mild Wound: dressing dry  Lab Results: Recent Labs    05/13/23 0357 05/14/23 0312  WBC 5.9 7.7  HGB 9.3* 9.9*  HCT 28.2* 29.3*  PLT 78* 112*   BMET:  Recent Labs    05/13/23 0357 05/14/23 0312  NA 134* 138  K 4.3 4.4  CL 100 103  CO2 25 32  GLUCOSE 117* 118*  BUN 21 19  CREATININE 0.97 0.96  CALCIUM 8.5* 8.9    PT/INR:  Recent Labs    05/11/23 1445  LABPROT 17.6*  INR 1.4*   ABG    Component Value Date/Time   PHART 7.330 (L) 05/11/2023 1944   HCO3 23.6 05/11/2023 1944   TCO2 25 05/11/2023 1944   ACIDBASEDEF 2.0 05/11/2023 1944   O2SAT 94 05/11/2023 1944   CBG (last 3)  Recent Labs    05/13/23 1614 05/13/23 2158 05/14/23 0618  GLUCAP 123* 118* 106*   CXR: clear  ECG: sinus 70's, no acute changes.  Assessment/Plan: S/P Procedure(s) (LRB): AORTIC VALVE REPLACEMENT USING INSPIRIS RESILIA AORTIC VALVE SIZE 23 MM (N/A) TRANSESOPHAGEAL ECHOCARDIOGRAM (TEE) (N/A)  POD  3  Hemodynamically stable. Rhythm is sinus 70's now. Will roll and tape pacer wires and plan to remove tomorrow if maintains sinus rhythm.   -5L yesterday and wt now only 6 lbs over preop. Continue oral lasix and KCL.  Glucose under good control and no hx of DM with normal Hgb A1c. DC SEMGLEE and SSI.  DC foley.  Transfer to 4E and continue IS, ambulation.   LOS: 3 days    Alleen Borne 05/14/2023

## 2023-05-15 NOTE — Plan of Care (Signed)
  Problem: Education: Goal: Knowledge of General Education information will improve Description: Including pain rating scale, medication(s)/side effects and non-pharmacologic comfort measures Outcome: Progressing   Problem: Health Behavior/Discharge Planning: Goal: Ability to manage health-related needs will improve Outcome: Progressing   Problem: Clinical Measurements: Goal: Ability to maintain clinical measurements within normal limits will improve Outcome: Progressing Goal: Will remain free from infection Outcome: Progressing Goal: Diagnostic test results will improve Outcome: Progressing Goal: Respiratory complications will improve Outcome: Progressing Goal: Cardiovascular complication will be avoided Outcome: Progressing   Problem: Nutrition: Goal: Adequate nutrition will be maintained Outcome: Progressing   Problem: Coping: Goal: Level of anxiety will decrease Outcome: Progressing   Problem: Elimination: Goal: Will not experience complications related to bowel motility Outcome: Progressing Goal: Will not experience complications related to urinary retention Outcome: Progressing   Problem: Pain Managment: Goal: General experience of comfort will improve and/or be controlled Outcome: Progressing   Problem: Safety: Goal: Ability to remain free from injury will improve Outcome: Progressing   Problem: Skin Integrity: Goal: Risk for impaired skin integrity will decrease Outcome: Progressing   Problem: Education: Goal: Will demonstrate proper wound care and an understanding of methods to prevent future damage Outcome: Progressing Goal: Knowledge of disease or condition will improve Outcome: Progressing Goal: Knowledge of the prescribed therapeutic regimen will improve Outcome: Progressing Goal: Individualized Educational Video(s) Outcome: Progressing   Problem: Clinical Measurements: Goal: Postoperative complications will be avoided or minimized Outcome:  Progressing   Problem: Respiratory: Goal: Respiratory status will improve Outcome: Progressing   Problem: Skin Integrity: Goal: Wound healing without signs and symptoms of infection Outcome: Progressing Goal: Risk for impaired skin integrity will decrease Outcome: Progressing   Problem: Urinary Elimination: Goal: Ability to achieve and maintain adequate renal perfusion and functioning will improve Outcome: Progressing

## 2023-05-15 NOTE — Progress Notes (Signed)
      [address]       [place] [address]             [phone]                 4 Days Post-Op Procedure(s) (LRB): AORTIC VALVE REPLACEMENT USING INSPIRIS RESILIA AORTIC VALVE SIZE 23 MM (N/A) TRANSESOPHAGEAL ECHOCARDIOGRAM (TEE) (N/A)   Events: No event _______________________________________________________________ Vitals: BP 120/75   Pulse 63   Temp 99 F (37.2 C) (Oral)   Resp 17   Ht 5\' 11"  (1.803 m)   Wt 126.1 kg   SpO2 90%   BMI 38.77 kg/m  Filed Weights   05/13/23 0411 05/14/23 0500 05/15/23 0500  Weight: 131.8 kg 128.7 kg 126.1 kg     - Neuro: alert NAD  - Cardiovascular: sinus   Drips: none.      - Pulm: EWOB    ABG    Component Value Date/Time   PHART 7.330 (L) 05/11/2023 1944   PCO2ART 44.8 05/11/2023 1944   PO2ART 76 (L) 05/11/2023 1944   HCO3 23.6 05/11/2023 1944   TCO2 25 05/11/2023 1944   ACIDBASEDEF 2.0 05/11/2023 1944   O2SAT 94 05/11/2023 1944    - Abd: ND - Extremity: warm  .Intake/Output      02/28 0701 03/01 0700 03/01 0701 03/02 0700   P.O. 360    Total Intake(mL/kg) 360 (2.9)    Urine (mL/kg/hr) 4035 (1.3)    Chest Tube     Total Output 4035    Net -3675            _______________________________________________________________ Labs:    Latest Ref Rng & Units 05/14/2023    3:12 AM 05/13/2023    3:57 AM 05/12/2023    2:31 PM  CBC  WBC 4.0 - 10.5 K/uL 7.7  5.9  8.2   Hemoglobin 13.0 - 17.0 g/dL 9.9  9.3  9.9   Hematocrit 39.0 - 52.0 % 29.3  28.2  29.5   Platelets 150 - 400 K/uL 112  78  96       Latest Ref Rng & Units 05/14/2023    3:12 AM 05/13/2023    3:57 AM 05/12/2023    2:31 PM  CMP  Glucose 70 - 99 mg/dL 914  782  956   BUN 8 - 23 mg/dL 19  21  17    Creatinine 0.61 - 1.24 mg/dL 2.13  0.86  5.78   Sodium 135 - 145 mmol/L 138  134  135   Potassium 3.5 - 5.1 mmol/L 4.4  4.3  3.7   Chloride 98 - 111 mmol/L 103  100  103   CO2 22 - 32 mmol/L 32  25  28   Calcium 8.9 - 10.3 mg/dL 8.9   8.5  8.1     CXR: -  _______________________________________________________________  Assessment and Plan: POD 4 s/p AVR  Neuro: pain controlled CV: stable.  Will remove pacing wires Pulm: IS, ambulation Renal: creat stable.  Back to baseline weight GI: on diet Heme: stable ID: afebrile Endo: SSI Dispo: home ready, but some anxiety about moving at home.  Will walk more today, possible home tomorrow   Corliss Skains 05/15/2023 9:07 AM

## 2023-05-15 NOTE — Progress Notes (Addendum)
 Cardiac rehab educated pt on sternal precautions, restrictions, sternal incision wound care, IS use, heart healthy diet, and exercise guidelines. Pt receptive. CRP II referral placed for [facility].   [id number] Guss Bunde, RRT, BSRT 05/15/2023 10:50 AM

## 2023-05-16 MED ORDER — METOPROLOL TARTRATE 25 MG PO TABS: 12.5000 mg | ORAL_TABLET | Freq: Two times a day (BID) | ORAL | 3 refills | Status: DC

## 2023-05-16 MED ORDER — TRAMADOL HCL 50 MG PO TABS: 50.0000 mg | ORAL_TABLET | Freq: Four times a day (QID) | ORAL | 0 refills | Status: DC | PRN

## 2023-05-16 MED ORDER — ACETAMINOPHEN 500 MG PO TABS: 500.0000 mg | ORAL_TABLET | Freq: Four times a day (QID) | ORAL | Status: AC | PRN

## 2023-05-16 NOTE — Progress Notes (Signed)
 Discharge instructions reviewed with patient and his son. They verbalize understanding. VSS. Removed from monitor.

## 2023-05-16 NOTE — Progress Notes (Signed)
      [address]       [place] [address]             [phone]                 5 Days Post-Op Procedure(s) (LRB): AORTIC VALVE REPLACEMENT USING INSPIRIS RESILIA AORTIC VALVE SIZE 23 MM (N/A) TRANSESOPHAGEAL ECHOCARDIOGRAM (TEE) (N/A)   Events: No event _______________________________________________________________ Vitals: BP (!) 147/88   Pulse 80   Temp 98.9 F (37.2 C) (Oral)   Resp 15   Ht 5\' 11"  (1.803 m)   Wt 123 kg   SpO2 92%   BMI 37.82 kg/m  Filed Weights   05/14/23 0500 05/15/23 0500 05/16/23 0500  Weight: 128.7 kg 126.1 kg 123 kg     - Neuro: alert NAD  - Cardiovascular: sinus   Drips: none.      - Pulm: EWOB    ABG    Component Value Date/Time   PHART 7.330 (L) 05/11/2023 1944   PCO2ART 44.8 05/11/2023 1944   PO2ART 76 (L) 05/11/2023 1944   HCO3 23.6 05/11/2023 1944   TCO2 25 05/11/2023 1944   ACIDBASEDEF 2.0 05/11/2023 1944   O2SAT 94 05/11/2023 1944    - Abd: ND - Extremity: warm  .Intake/Output      03/01 0701 03/02 0700 03/02 0701 03/03 0700   P.O.     Total Intake(mL/kg)     Urine (mL/kg/hr)     Total Output     Net          Urine Occurrence 6 x    Stool Occurrence 1 x       _______________________________________________________________ Labs:    Latest Ref Rng & Units 05/14/2023    3:12 AM 05/13/2023    3:57 AM 05/12/2023    2:31 PM  CBC  WBC 4.0 - 10.5 K/uL 7.7  5.9  8.2   Hemoglobin 13.0 - 17.0 g/dL 9.9  9.3  9.9   Hematocrit 39.0 - 52.0 % 29.3  28.2  29.5   Platelets 150 - 400 K/uL 112  78  96       Latest Ref Rng & Units 05/14/2023    3:12 AM 05/13/2023    3:57 AM 05/12/2023    2:31 PM  CMP  Glucose 70 - 99 mg/dL 244  010  272   BUN 8 - 23 mg/dL 19  21  17    Creatinine 0.61 - 1.24 mg/dL 5.36  6.44  0.34   Sodium 135 - 145 mmol/L 138  134  135   Potassium 3.5 - 5.1 mmol/L 4.4  4.3  3.7   Chloride 98 - 111 mmol/L 103  100  103   CO2 22 - 32 mmol/L 32  25  28   Calcium 8.9 - 10.3 mg/dL  8.9  8.5  8.1     CXR: -  _______________________________________________________________  Assessment and Plan: POD 5 s/p AVR  Neuro: pain controlled CV: stable.  Will remove pacing wires Pulm: IS, ambulation Renal: creat stable.  Back to baseline weight GI: on diet Heme: stable ID: afebrile Endo: SSI Dispo: home today    Corliss Skains 05/16/2023 11:41 AM

## 2023-05-26 ENCOUNTER — Ambulatory Visit: Payer: Medicare Other

## 2023-05-26 DIAGNOSIS — Z4802 Encounter for removal of sutures: Secondary | ICD-10-CM

## 2023-05-26 NOTE — Progress Notes (Deleted)
 [address]       [name] [address]             [phone]       HPI: Thomas [name] is a 68 year old male with a past medical history of hypertension, hyperlipidemia, prostate cancer status post robotic assisted radical prostatectomy in 2016, and aortic stenosis. The patient returns for routine postoperative follow-up having undergone aortic valve replacement utilizing a bioprosthetic valve by Dr. Sherene Dilling on 05/11/23. The patient's early postoperative recovery while in the hospital was notable for thrombocytopenia that improved with time and an accelerated junctional rhythm that resolved prior to discharge. Since hospital discharge the patient reports ***.   Current Outpatient Medications  Medication Sig Dispense Refill   acetaminophen  (TYLENOL ) 500 MG tablet Take 1-2 tablets (500-1,000 mg total) by mouth every 6 (six) hours as needed.     aspirin  EC 81 MG tablet Take 81 mg by mouth daily. Swallow whole.     Biotin 1000 MCG tablet Take 1,000 mcg by mouth daily.     Boswellia-Glucosamine-Vit D (GLUCOSAMINE COMPLEX -BOSWELLIA PO) Take 1,500 mg by mouth daily.     Cholecalciferol (VITAMIN D -1000 MAX ST) 25 MCG (1000 UT) tablet Take 1,000 Units by mouth daily.     Coenzyme Q10 (CO Q 10) 100 MG CAPS Take 100 mg by mouth daily.     ezetimibe  (ZETIA ) 10 MG tablet TAKE ONE TABLET BY MOUTH ONE TIME DAILY 90 tablet 2   fluticasone  (FLONASE ) 50 MCG/ACT nasal spray Place 1 spray into both nostrils at bedtime.     Garlic 1000 MG CAPS Take 1,000 mg by mouth daily.     metoprolol  tartrate (LOPRESSOR ) 25 MG tablet Take 0.5 tablets (12.5 mg total) by mouth 2 (two) times daily. 30 tablet 3   Multiple Vitamins-Minerals (MULTIVITAMIN WITH MINERALS) tablet Take 1 tablet by mouth daily. Men     olmesartan  (BENICAR ) 20 MG tablet Take 1 tablet (20 mg total) by mouth daily. 90 tablet 3   Omega-3 Fatty Acids (OMEGA 3 PO) Take 1,500 mg by mouth daily.     rosuvastatin  (CRESTOR ) 40 MG  tablet TAKE ONE TABLET BY MOUTH ONE TIME DAILY 90 tablet 2   traMADol  (ULTRAM ) 50 MG tablet Take 1 tablet (50 mg total) by mouth every 6 (six) hours as needed for moderate pain (pain score 4-6). 28 tablet 0   vortioxetine  HBr (TRINTELLIX ) 20 MG TABS tablet Take 1 tablet (20 mg total) by mouth daily. 30 tablet 0   No current facility-administered medications for this visit.   Vitals:  Physical Exam: *** General Neuro CV Pulm GI Extremities Wound  Diagnostic Tests: ***  Impression/Plan: *** S/P AVR:  Continue to avoid any heavy lifting or strenuous use of your arms or shoulders for at least a total of three months from the time of surgery.  After three months you may gradually increase how much you lift or otherwise use your arms or chest as tolerated, with limits based upon whether or not activities lead to the return of significant discomfort.  You may return to driving an automobile as long as you are no longer requiring oral narcotic pain relievers during the daytime.  It would be wise to start driving only short distances during the daylight and gradually increase from there as you feel comfortable.  You are encouraged to enroll and participate in the outpatient cardiac rehab program beginning as soon as practical.  Endocarditis is a potentially  serious infection of heart valves or inside lining of the heart.  It occurs more commonly in patients with diseased heart valves (such as patient's with aortic or mitral valve disease) and in patients who have undergone heart valve repair or replacement.  Certain surgical and dental procedures may put you at risk, such as dental cleaning, other dental procedures, or any surgery involving the respiratory, urinary, gastrointestinal tract, gallbladder or prostate gland.   To minimize your chances for develooping endocarditis, maintain good oral health and seek prompt medical attention for any infections involving the mouth, teeth, gums,  skin or urinary tract.    Always notify your doctor or dentist about your underlying heart valve condition before having any invasive procedures. You will need to take antibiotics before certain procedures, including all routine dental cleanings or other dental procedures.  Your cardiologist or dentist should prescribe these antibiotics for you to be taken ahead of time.  Randa Burton, PA-C [facility] [phone]

## 2023-05-26 NOTE — Progress Notes (Signed)
 Patient arrived for nurse visit to remove sutures post-AVR 2/25.  Two sutures removed with no signs or symptoms of infection noted.  Patient tolerated suture removal well.  Incisions well approximated. Patient and family instructed to keep the incision site clean and dry. Patient and family acknowledged instructions given.  All questions answered.

## 2023-05-27 ENCOUNTER — Telehealth (HOSPITAL_COMMUNITY): Payer: Self-pay

## 2023-05-27 NOTE — Telephone Encounter (Signed)
 Attempted to call patient in regards to Cardiac Rehab - LM on VM

## 2023-05-27 NOTE — Telephone Encounter (Signed)
 Pt insurance is active and benefits verified through Medicare A/B Co-pay $0, DED $257/$257 met, out of pocket $0/$0 met, co-insurance 20%. No pre-authorization required. Passport, 3/13 @ 12:26, REF# [id number]   How many CR sessions are covered? (36 visits for TCR, 72 visits for ICR)72 Is this a lifetime maximum or an annual maximum? Lifetime Has the member used any of these services to date? No Is there a time limit (weeks/months) on start of program and/or program completion? No   2ndary insurance is active and benefits verified through [employer]. Co-pay $0, DED $257/$0 met, out of pocket $0/$0 met, co-insurance 0%. No pre-authorization required. Passport, 3/13 @ 12:32, REF# [id number]      Will contact patient to see if he is interested in the Cardiac Rehab Program. If interested, patient will need to complete follow up appt. Once completed, patient will be contacted for scheduling upon review by the RN Navigator.

## 2023-05-28 ENCOUNTER — Telehealth: Payer: Self-pay

## 2023-05-28 NOTE — Telephone Encounter (Signed)
 [employer]/ FMLA paperwork complete per patient's request. Patient did not complete patient portion. Patient to sign. Office to fax and make copy. Patient to take original. DOS 05/11/23. Approx RTW date 08/09/23.

## 2023-05-31 ENCOUNTER — Other Ambulatory Visit: Payer: Self-pay | Admitting: *Deleted

## 2023-05-31 ENCOUNTER — Telehealth (HOSPITAL_COMMUNITY): Payer: Self-pay | Admitting: *Deleted

## 2023-05-31 DIAGNOSIS — Z952 Presence of prosthetic heart valve: Secondary | ICD-10-CM

## 2023-05-31 NOTE — Telephone Encounter (Signed)
 Returned pt call from department voicemail for cardiac rehab.  Left message with general information regarding the cardiac rehab referral process and the nature of the previous call made.  Contact information provided. Alanson Aly, BSN Cardiac and Emergency planning/management officer

## 2023-06-01 NOTE — Progress Notes (Unsigned)
      [address]       [place] [address]             [phone]       HPI: Patient returns for routine postoperative follow-up having undergone S/P AVR on 05/11/2023 with Dr. Laneta Simmers. The patient's early postoperative recovery while in the hospital was notable for some brief junctional rhythm post operatively.  He otherwise progressed without difficulty.  He was scheduled for follow up on 3/26, however he contact our office for a sooner follow up due to a "popping" in his sternum.  He presents today for evaluation.  Current Outpatient Medications  Medication Sig Dispense Refill   acetaminophen (TYLENOL) 500 MG tablet Take 1-2 tablets (500-1,000 mg total) by mouth every 6 (six) hours as needed.     aspirin EC 81 MG tablet Take 81 mg by mouth daily. Swallow whole.     Biotin 1000 MCG tablet Take 1,000 mcg by mouth daily.     Boswellia-Glucosamine-Vit D (GLUCOSAMINE COMPLEX -BOSWELLIA PO) Take 1,500 mg by mouth daily.     Cholecalciferol (VITAMIN D-1000 MAX ST) 25 MCG (1000 UT) tablet Take 1,000 Units by mouth daily.     Coenzyme Q10 (CO Q 10) 100 MG CAPS Take 100 mg by mouth daily.     ezetimibe (ZETIA) 10 MG tablet TAKE ONE TABLET BY MOUTH ONE TIME DAILY 90 tablet 2   fluticasone (FLONASE) 50 MCG/ACT nasal spray Place 1 spray into both nostrils at bedtime.     Garlic 1000 MG CAPS Take 1,000 mg by mouth daily.     metoprolol tartrate (LOPRESSOR) 25 MG tablet Take 0.5 tablets (12.5 mg total) by mouth 2 (two) times daily. 30 tablet 3   Multiple Vitamins-Minerals (MULTIVITAMIN WITH MINERALS) tablet Take 1 tablet by mouth daily. Men     olmesartan (BENICAR) 20 MG tablet Take 1 tablet (20 mg total) by mouth daily. 90 tablet 3   Omega-3 Fatty Acids (OMEGA 3 PO) Take 1,500 mg by mouth daily.     rosuvastatin (CRESTOR) 40 MG tablet TAKE ONE TABLET BY MOUTH ONE TIME DAILY 90 tablet 2   traMADol (ULTRAM) 50 MG tablet Take 1 tablet (50 mg total) by mouth every 6 (six) hours as  needed for moderate pain (pain score 4-6). 28 tablet 0   vortioxetine HBr (TRINTELLIX) 20 MG TABS tablet Take 1 tablet (20 mg total) by mouth daily. 30 tablet 0   No current facility-administered medications for this visit.    Physical Exam: ***  Diagnostic Tests: ***  A/P:  S/P AVR   Lowella Dandy, PA-C [facility] [phone]

## 2023-06-01 NOTE — Patient Instructions (Signed)
Patient is counseled regarding the importance of long term risk factor modification as they pertain to the presence of ischemic heart disease including avoiding the use of all tobacco products, dietary modifications and medical therapy for diabetes, cholesterol and lipid management, and regular exercise.     You are encouraged to enroll and participate in the outpatient cardiac rehab program beginning as soon as practical.  Make every effort to maintain a "heart-healthy" lifestyle with regular physical exercise and adherence to a low-fat, low-carbohydrate diet.  Continue to seek regular follow-up appointments with your primary care physician and/or cardiologist.

## 2023-06-03 ENCOUNTER — Ambulatory Visit (INDEPENDENT_AMBULATORY_CARE_PROVIDER_SITE_OTHER): Payer: Self-pay

## 2023-06-03 ENCOUNTER — Ambulatory Visit
Admission: RE | Admit: 2023-06-03 | Discharge: 2023-06-03 | Disposition: A | Discharge status: Home / Self Care | Source: Ambulatory Visit | Attending: Surgery | Admitting: Surgery

## 2023-06-03 VITALS — BP 127/79 | HR 67 | Resp 20 | Wt 273.0 lb

## 2023-06-03 DIAGNOSIS — Z952 Presence of prosthetic heart valve: Secondary | ICD-10-CM

## 2023-06-09 ENCOUNTER — Ambulatory Visit: Payer: Medicare Other

## 2023-06-15 NOTE — Progress Notes (Unsigned)
 Cardiology Office Note:    Date:  06/16/2023   ID:  Thomas [name], DOB September 12, 1955, MRN [id number]  PCP:  Emilio Aspen, MD  Cardiologist:  Little Ishikawa, MD  Electrophysiologist:  None   Referring MD: Emilio Aspen, *   Chief Complaint  Patient presents with   aortic valve replacement    History of Present Illness:    Thomas [name] is a 68 y.o. male with a hx of CAD, hypertension, hyperlipidemia, aortic stenosis, obesity who presents for follow-up.  Echocardiogram 01/27/2019 showed EF 55 to 60%, moderate LVH, grade 1 diastolic dysfunction, normal RV function, mild to moderate aortic stenosis.  Coronary CTA on 02/23/2019 showed nonobstructive CAD, calcium score 113 (87th percentile).  Zio patch x6 days on 06/12/2020 showed 1 episode of NSVT lasting 4 beats, frequent PVCs (6.3% of beats).  Echocardiogram 05/28/2021 showed EF 65 to 70%, moderate LVH, grade 1 diastolic dysfunction, normal RV function, severe left atrial enlargement, moderate aortic stenosis.  Echocardiogram 03/30/2022 showed EF 60 to 65%, grade 1 diastolic dysfunction, normal RV function, moderate to severe aortic stenosis.  Zio patch x 7 days 05/2022 showed frequent PVCs (7.7% of beats).  Echo 09/2022 showed normal biventricular function, severe aortic stenosis (mean gradient 47 mmHg, AVA 0.8 cm).  LHC/RHC 10/2022 showed 20% mid LAD stenosis, normal filling pressures.  He underwent AVR with 23 mm Inspiris Resilia valve on 05/11/2023 with Dr. Laneta Simmers.  Since last clinic visit, he reports he is doing well.  He is walking 15 minutes 3-4 times per day.  Reports some achiness in his chest that can occur at rest. Denies any dyspnea, lightheadedness, syncope, lower extremity edema, or palpitations.   BP Readings from Last 3 Encounters:  06/16/23 126/82  06/03/23 127/79  05/16/23 (!) 138/96    Wt Readings from Last 3 Encounters:  06/16/23 270 lb 12.8 oz (122.8 kg)  06/03/23 273 lb (123.8 kg)  05/16/23 271  lb 2.7 oz (123 kg)     Past Medical History:  Diagnosis Date   Anemia    hx of in 1992    Anxiety    Arthritis    lower back    Back pain    Blockage of coronary artery of heart (HCC)    Constipation    Dysrhythmia    PVCs   Generalized muscle weakness    Hearing loss    Heart murmur    History of anxiety    History of hiatal hernia    Hyperlipemia    Hypertension    IBS (irritable bowel syndrome)    Joint pain    Lower urinary tract symptoms (LUTS)    Malignant neoplasm of prostate (HCC) 04/17/2014   Other fatigue    Pneumonia    hx x2 of pneumonia at age 89 and bronchitis x3   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   Shortness of breath on exertion    Ventral hernia without obstruction or gangrene 03/27/2016   Vitamin D deficiency     Past Surgical History:  Procedure Laterality Date   AORTIC VALVE REPLACEMENT N/A 05/11/2023   Procedure: AORTIC VALVE REPLACEMENT USING INSPIRIS RESILIA AORTIC VALVE SIZE 23 MM;  Surgeon: Alleen Borne, MD;  Location: [facility] OR;  Service: Open Heart Surgery;  Laterality: N/A;   COLONOSCOPY  2021   CYST EXCISION Left    axillary   CYST EXCISION     back of neck   Epididymis Excision Left    Spermocele  x2   HEMORROIDECTOMY     HERNIA REPAIR Bilateral 1967 and 1986   x2 open inguinal hernia repairs   INCISION AND DRAINAGE OF WOUND  2017   Wound Infection Post OP   LASIK     1990s   LYMPHADENECTOMY Bilateral 05/17/2014   Procedure: PELVIC LYMPHADENECTOMY;  Surgeon: Heloise Purpura, MD;  Location: [facility] ORS;  Service: Urology;  Laterality: Bilateral;   PROSTATE BIOPSY  03/22/2014   Heloise Purpura   RIGHT/LEFT HEART CATH AND CORONARY ANGIOGRAPHY N/A 10/19/2022   Procedure: RIGHT/LEFT HEART CATH AND CORONARY ANGIOGRAPHY;  Surgeon: Kathleene Hazel, MD;  Location: [facility] INVASIVE CV LAB;  Service: Cardiovascular;  Laterality: N/A;   ROBOT ASSISTED LAPAROSCOPIC RADICAL PROSTATECTOMY N/A 05/17/2014   Procedure: ROBOTIC ASSISTED  LAPAROSCOPIC RADICAL PROSTATECTOMY LEVEL 2;  Surgeon: Heloise Purpura, MD;  Location: [facility] OR[facility];  Service: Urology;  Laterality: N/A;   SKIN TAG REMOVAL     30 tags removed over many years   TEE WITHOUT CARDIOVERSION N/A 05/11/2023   Procedure: TRANSESOPHAGEAL ECHOCARDIOGRAM (TEE);  Surgeon: Alleen Borne, MD;  Location: [facility] OR;  Service: Open Heart Surgery;  Laterality: N/A;   VASECTOMY     VENTRAL HERNIA REPAIR N/A 03/27/2016   Procedure: LAPAROSCOPIC VENTRAL HERNIA;  Surgeon: Ovidio Kin, MD;  Location: [facility] ORS;  Service: General;  Laterality: N/A;  WITH MESH    Current Medications: Current Meds  Medication Sig   aspirin EC 81 MG tablet Take 81 mg by mouth daily. Swallow whole.   Biotin 1000 MCG tablet Take 1,000 mcg by mouth daily.   Boswellia-Glucosamine-Vit D (GLUCOSAMINE COMPLEX -BOSWELLIA PO) Take 1,500 mg by mouth daily.   Cholecalciferol (VITAMIN D-1000 MAX ST) 25 MCG (1000 UT) tablet Take 1,000 Units by mouth daily.   Coenzyme Q10 (CO Q 10) 100 MG CAPS Take 100 mg by mouth daily.   ezetimibe (ZETIA) 10 MG tablet TAKE ONE TABLET BY MOUTH ONE TIME DAILY   fluticasone (FLONASE) 50 MCG/ACT nasal spray Place 1 spray into both nostrils at bedtime.   Garlic 1000 MG CAPS Take 1,000 mg by mouth daily.   metoprolol tartrate (LOPRESSOR) 25 MG tablet Take 0.5 tablets (12.5 mg total) by mouth 2 (two) times daily.   Multiple Vitamins-Minerals (MULTIVITAMIN WITH MINERALS) tablet Take 1 tablet by mouth daily. Men   olmesartan (BENICAR) 20 MG tablet Take 1 tablet (20 mg total) by mouth daily.   Omega-3 Fatty Acids (OMEGA 3 PO) Take 1,500 mg by mouth daily.   rosuvastatin (CRESTOR) 40 MG tablet TAKE ONE TABLET BY MOUTH ONE TIME DAILY   vortioxetine HBr (TRINTELLIX) 20 MG TABS tablet Take 1 tablet (20 mg total) by mouth daily.     Allergies:   Amoxicillin-pot clavulanate   Social History   Socioeconomic History   Marital status: Married    Spouse name: [name]   Number of children: 2   Years  of education: Not on file   Highest education level: Not on file  Occupational History   Occupation: Retired   Occupation: Retired from the car business but works part time at [employer]  Tobacco Use   Smoking status: Never   Smokeless tobacco: Never  Vaping Use   Vaping status: Never Used  Substance and Sexual Activity   Alcohol use: No    Alcohol/week: 0.0 standard drinks of alcohol   Drug use: No   Sexual activity: Yes    Partners: Male  Other Topics Concern   Not on file  Social  History Narrative   Not on file   Social Drivers of Health   Financial Resource Strain: Not on file  Food Insecurity: No Food Insecurity (05/11/2023)   Hunger Vital Sign    Worried About Running Out of Food in the Last Year: Never true    Ran Out of Food in the Last Year: Never true  Transportation Needs: Patient Unable To Answer (05/11/2023)   PRAPARE - Transportation    Lack of Transportation (Medical): Patient unable to answer    Lack of Transportation (Non-Medical): Patient unable to answer  Physical Activity: Not on file  Stress: Not on file  Social Connections: Patient Unable To Answer (05/11/2023)   Social Connection and Isolation Panel [NHANES]    Frequency of Communication with Friends and Family: Patient unable to answer    Frequency of Social Gatherings with Friends and Family: Patient unable to answer    Attends Religious Services: Patient unable to answer    Active Member of Clubs or Organizations: Patient unable to answer    Attends Banker Meetings: Patient unable to answer    Marital Status: Patient unable to answer     Family History: The patient's family history includes Alcohol abuse in his mother and another family member; Anxiety disorder in his mother and another family member; Bipolar disorder in his mother and another family member; COPD in his mother; Cancer in his father and mother; Depression in his mother and another family member; Drug abuse  in his father and another family member; Eating disorder in his mother and another family member; Heart attack in his mother and paternal grandfather; Heart disease in his mother and another family member; Heart failure in his mother; Hyperlipidemia in his mother; Hypertension in his mother; Kidney disease in his mother and another family member; Ovarian cancer in his mother.  ROS:   Please see the history of present illness.     All other systems reviewed and are negative.  EKGs/Labs/Other Studies Reviewed:    The following studies were reviewed today:   EKG:   01/08/22: Sinus rhythm, PVC, rate 64, no ST abnormalities 03/02/2023: Sinus rhythm, rate 60, nonspecific T wave flattening 06/16/23: Sinus rhythm, PVCs, rate 60, nonspecific T wave flattening, poor R wave progression  Recent Labs: 05/07/2023: ALT 50 05/12/2023: Magnesium 2.3 05/14/2023: BUN 19; Creatinine, Ser 0.96; Hemoglobin 9.9; Platelets 112; Potassium 4.4; Sodium 138  Recent Lipid Panel    Component Value Date/Time   CHOL 105 01/08/2022 1034   TRIG 83 01/08/2022 1034   HDL 40 01/08/2022 1034   CHOLHDL 2.6 01/08/2022 1034   LDLCALC 48 01/08/2022 1034    Physical Exam:    VS:  BP 126/82 (BP Location: Left Arm, Patient Position: Sitting, Cuff Size: Large)   Pulse (!) 57   Ht 5' 10.98" (1.803 m)   Wt 270 lb 12.8 oz (122.8 kg)   SpO2 95%   BMI 37.79 kg/m     Wt Readings from Last 3 Encounters:  06/16/23 270 lb 12.8 oz (122.8 kg)  06/03/23 273 lb (123.8 kg)  05/16/23 271 lb 2.7 oz (123 kg)     GEN:  Well nourished, well developed in no acute distress HEENT: Normal NECK: No JVD; No carotid bruits LYMPHATICS: No lymphadenopathy CARDIAC: irregular, normal rate, 3/6 systolic murmur RESPIRATORY:  Clear to auscultation without rales, wheezing or rhonchi  ABDOMEN: Soft, non-tender, non-distended MUSCULOSKELETAL:  No edema; No deformity  SKIN: Warm and dry NEUROLOGIC:  Alert and oriented x 3 PSYCHIATRIC:  Normal  affect   ASSESSMENT:    1. S/P AVR   2. CAD in native artery   3. PVC (premature ventricular contraction)   4. Essential hypertension   5. Hyperlipidemia, unspecified hyperlipidemia type      PLAN:    Aortic stenosis:  Echo 09/2022 showed normal biventricular function, severe aortic stenosis (mean gradient 47 mmHg, AVA 0.8 cm).  LHC/RHC 10/2022 showed 20% mid LAD stenosis, normal filling pressures.  He underwent AVR with 23 mm Inspiris Resilia valve on 05/11/2023 with Dr. Laneta Simmers -Follow-up echo scheduled for 4/8 -Planning to start cardiac rehab  CAD: Coronary CTA on 02/23/2019 showed nonobstructive CAD, calcium score 113 (87th percentile).  Echocardiogram 05/28/2021 showed EF 65 to 70%, moderate LVH, grade 1 diastolic dysfunction, normal RV function, severe left atrial enlargement, moderate aortic stenosis. -Continue rosuvastatin and Zetia  PVCs: Zio patch x 7 days 05/2022 showed frequent PVCs (7.7% of beats).  Has had low resting heart rate, not on AV nodal block.  Started on Lopressor 12.5 mg twice daily after his AVR, tolerating well  Hypertension: Was on valsartan-HCTZ 320-25 mg daily prior to AVR, now on Lopressor 12.5 mg twice daily and olmesartan 20 mg daily  Hyperlipidemia: On rosuvastatin 40 mg daily and Zetia 10 mg daily.  LDL 47 on 12/10/22.    OSA: severe on sleep study 01/2022, started CPAP.  Reports compliance   RTC in 4 months   Medication Adjustments/Labs and Tests Ordered: Current medicines are reviewed at length with the patient today.  Concerns regarding medicines are outlined above.  Orders Placed This Encounter  Procedures   AMB referral to cardiac rehabilitation   EKG 12-Lead   No orders of the defined types were placed in this encounter.   Patient Instructions  Medication Instructions:  Continue medication *If you need a refill on your cardiac medications before your next appointment, please call your pharmacy*  Lab Work: none If you have labs  (blood work) drawn today and your tests are completely normal, you will receive your results only by: MyChart Message (if you have MyChart) OR A paper copy in the mail If you have any lab test that is abnormal or we need to change your treatment, we will call you to review the results.  Testing/Procedures: none  Follow-Up: At [facility], you and your health needs are our priority.  As part of our continuing mission to provide you with exceptional heart care, our providers are all part of one team.  This team includes your primary Cardiologist (physician) and Advanced Practice Providers or APPs (Physician Assistants and Nurse Practitioners) who all work together to provide you with the care you need, when you need it.  Your next appointment:   4 month(s)  Provider:   Little Ishikawa, MD     We recommend signing up for the patient portal called "MyChart".  Sign up information is provided on this After Visit Summary.  MyChart is used to connect with patients for Virtual Visits (Telemedicine).  Patients are able to view lab/test results, encounter notes, upcoming appointments, etc.  Non-urgent messages can be sent to your provider as well.   To learn more about what you can do with MyChart, go to [email or web address].   Other Instructions Referral to Cardiac Rehabilatatin      1st Floor: - Lobby - Registration  - Pharmacy  - Lab - Cafe  2nd Floor: - PV Lab - Diagnostic Testing (echo, CT, nuclear med)  3rd Floor: - Vacant  4th Floor: - TCTS (cardiothoracic surgery) - AFib Clinic - Structural Heart Clinic - Vascular Surgery  - Vascular Ultrasound  5th Floor: - HeartCare Cardiology (general and EP) - Clinical Pharmacy for coumadin, hypertension, lipid, weight-loss medications, and med management appointments    Valet parking services will be available as well.      Signed, Little Ishikawa, MD  06/16/2023 10:05 AM    [facility]  HeartCare

## 2023-06-16 ENCOUNTER — Encounter: Payer: Self-pay | Admitting: Cardiology

## 2023-06-16 ENCOUNTER — Ambulatory Visit: Payer: Medicare Other | Attending: Cardiology | Admitting: Cardiology

## 2023-06-16 VITALS — BP 126/82 | HR 57 | Ht 70.98 in | Wt 270.8 lb

## 2023-06-16 DIAGNOSIS — I493 Ventricular premature depolarization: Secondary | ICD-10-CM | POA: Diagnosis present

## 2023-06-16 DIAGNOSIS — I2510 Atherosclerotic heart disease of native coronary artery without angina pectoris: Secondary | ICD-10-CM | POA: Insufficient documentation

## 2023-06-16 DIAGNOSIS — I10 Essential (primary) hypertension: Secondary | ICD-10-CM | POA: Diagnosis present

## 2023-06-16 DIAGNOSIS — E785 Hyperlipidemia, unspecified: Secondary | ICD-10-CM | POA: Diagnosis present

## 2023-06-16 DIAGNOSIS — Z952 Presence of prosthetic heart valve: Secondary | ICD-10-CM | POA: Insufficient documentation

## 2023-06-16 NOTE — Patient Instructions (Signed)
 Medication Instructions:  Continue medication *If you need a refill on your cardiac medications before your next appointment, please call your pharmacy*  Lab Work: none If you have labs (blood work) drawn today and your tests are completely normal, you will receive your results only by: MyChart Message (if you have MyChart) OR A paper copy in the mail If you have any lab test that is abnormal or we need to change your treatment, we will call you to review the results.  Testing/Procedures: none  Follow-Up: At [facility], you and your health needs are our priority.  As part of our continuing mission to provide you with exceptional heart care, our providers are all part of one team.  This team includes your primary Cardiologist (physician) and Advanced Practice Providers or APPs (Physician Assistants and Nurse Practitioners) who all work together to provide you with the care you need, when you need it.  Your next appointment:   4 month(s)  Provider:   Little Ishikawa, MD     We recommend signing up for the patient portal called "MyChart".  Sign up information is provided on this After Visit Summary.  MyChart is used to connect with patients for Virtual Visits (Telemedicine).  Patients are able to view lab/test results, encounter notes, upcoming appointments, etc.  Non-urgent messages can be sent to your provider as well.   To learn more about what you can do with MyChart, go to [email or web address].   Other Instructions Referral to Cardiac Rehabilatatin      1st Floor: - Lobby - Registration  - Pharmacy  - Lab - Cafe  2nd Floor: - PV Lab - Diagnostic Testing (echo, CT, nuclear med)  3rd Floor: - Vacant  4th Floor: - TCTS (cardiothoracic surgery) - AFib Clinic - Structural Heart Clinic - Vascular Surgery  - Vascular Ultrasound  5th Floor: - HeartCare Cardiology (general and EP) - Clinical Pharmacy for coumadin, hypertension, lipid, weight-loss  medications, and med management appointments    Valet parking services will be available as well.

## 2023-06-18 ENCOUNTER — Telehealth: Payer: Self-pay | Admitting: Cardiology

## 2023-06-18 ENCOUNTER — Telehealth: Payer: Self-pay | Admitting: *Deleted

## 2023-06-18 NOTE — Telephone Encounter (Signed)
 Patient contacted answering service last night for low HR. States he was evaluated by a neighboring physician who listened to his HR and advised it to be 36. States when he got home HR was back in the 60's. States HR is ranging between 48-62. Denies dizziness. Patient states on call MD advised he not take this night time or morning dose of lopressor and to call his Cardiologist this AM. Further emphasized MD's instructions. Patient states he will call Dr. Milus Mallick this morning to discuss. Nothing further at this time.

## 2023-06-18 NOTE — Telephone Encounter (Signed)
 Patient identification verified by 2 forms. Thomas Rail, RN    Called and spoke to patient  Patient states:   -having low heart rate   -last night heart rate was low for a few hours   -the lowest reading last night was 36  -Heart rate today is 48-62   -skipped last nights and this morning dose of metoprolol   -Typically takes Metoprolol 12.5mg  twice daily   -does not have a home BP machine at this time  Patient denies:   -Lightheadedness/dizziness   -SOB/difficulty breathing   -weakness  Informed patient message sent to Dr. Bjorn Pippin  Patient verbalized understanding, no questions at this time

## 2023-06-18 NOTE — Telephone Encounter (Signed)
 STAT if HR is under 50 or over 120 (normal HR is 60-100 beats per minute)  What is your heart rate? Yesterday it was 28, today it  is  ranging from 48 to 99- he says this is resting  Do you have a log of your heart rate readings (document readings)?    Do you have any other symptoms? No symptom

## 2023-06-20 NOTE — Telephone Encounter (Signed)
 Was low heart rate recorded while sleeping?  Would not worry about low heart rates while sleeping.  Sounds like not having any symptoms from low heart rate, would continue to monitor

## 2023-06-21 ENCOUNTER — Telehealth (HOSPITAL_COMMUNITY): Payer: Self-pay | Admitting: *Deleted

## 2023-06-21 NOTE — Telephone Encounter (Signed)
 Pt calling back to f/u on call from Friday. Please advise

## 2023-06-21 NOTE — Telephone Encounter (Signed)
 Returned call from message left on departmental voicemail regarding scheduling for Cardiac rehab.  Called and spoke to Thomas [name]. Advised him that based upon his last post follow up appt on 4/2  with Dr. Bjorn Pippin and his previous follow up with Lowella Dandy PA with Dr. Laneta Simmers on 3/20 he is cleared to proceed with scheduling cardiac rehab.  The support staff brought to my attention message you sent to Dr Milus Mallick about your heart rate being low and remaining low despite holding metoprolol. Contiued to review message from today and await response from Dr Milus Mallick.  Unclear what the next steps may be and the impact if any on cardiac rehab.  Will pause momentarily to see what the plan will be and proceed with scheduling if appropriate or continue to hold.  Thomas [name] verbalized understanding.  Encouraged pt to palpate his pulse for one full min verses 15 seconds due to "skip beats" for accurate reading.  Verbalized understanding. Alanson Aly, BSN Cardiac and Emergency planning/management officer

## 2023-06-21 NOTE — Telephone Encounter (Signed)
 Patient identification verified by 2 forms. Marilynn Rail, RN    Called and spoke to patient  Patient states:   -heart rate occurred while awake not while sleeping   -noticed heart rate only decreases at night while watching TV/resting   -continued to hold the Metoprolol over the weekend   -he was not having any symptoms from the low heart rate   -last night heart rate was 31-60 while at rest watching TV   -Historically his heart rate ranges 40-50   -it is new/concerning that it is decreasing to the 30s while he is awake  Informed patient message sent to Dr. Darryl Nestle for input  Patient verbalized understanding, no questions at this time

## 2023-06-22 ENCOUNTER — Encounter (HOSPITAL_COMMUNITY): Payer: Self-pay

## 2023-06-22 ENCOUNTER — Ambulatory Visit (HOSPITAL_COMMUNITY): Payer: Medicare Other | Attending: Cardiovascular Disease

## 2023-06-22 DIAGNOSIS — Z952 Presence of prosthetic heart valve: Secondary | ICD-10-CM | POA: Diagnosis present

## 2023-06-22 LAB — ECHOCARDIOGRAM COMPLETE
AR max vel: 1.36 cm2
AV Area VTI: 1.41 cm2
AV Area mean vel: 1.55 cm2
AV Mean grad: 17 mmHg
AV Peak grad: 28.5 mmHg
Ao pk vel: 2.67 m/s
Area-P 1/2: 3.36 cm2
S' Lateral: 3.4 cm

## 2023-06-22 NOTE — Telephone Encounter (Signed)
 Recommend discontinuing metoprolol

## 2023-06-23 ENCOUNTER — Encounter (HOSPITAL_COMMUNITY)
Admission: RE | Admit: 2023-06-23 | Discharge: 2023-06-23 | Disposition: A | Discharge status: Home / Self Care | Source: Ambulatory Visit | Attending: Cardiology | Admitting: Cardiology

## 2023-06-23 ENCOUNTER — Encounter: Payer: Self-pay | Admitting: *Deleted

## 2023-06-23 ENCOUNTER — Other Ambulatory Visit: Payer: Self-pay | Admitting: *Deleted

## 2023-06-23 VITALS — BP 136/82 | HR 62 | Ht 71.0 in | Wt 274.9 lb

## 2023-06-23 DIAGNOSIS — Z952 Presence of prosthetic heart valve: Secondary | ICD-10-CM | POA: Insufficient documentation

## 2023-06-23 NOTE — Progress Notes (Signed)
 Cardiac Individual Treatment Plan  Patient Details  Name: Thomas [name] MRN: [id number] Date of Birth: February 02, 1956 Referring Provider:   Flowsheet Row INTENSIVE CARDIAC REHAB ORIENT from 06/23/2023 in [facility]  Referring Provider Epifanio Lesches, MD       Initial Encounter Date:  Flowsheet Row INTENSIVE CARDIAC REHAB ORIENT from 06/23/2023 in [facility]  Date 06/23/23       Visit Diagnosis: 05/11/23 S/P AVR (aortic valve replacement)  Patient's Home Medications on Admission:  Current Outpatient Medications:    acetaminophen (TYLENOL) 500 MG tablet, Take 1-2 tablets (500-1,000 mg total) by mouth every 6 (six) hours as needed., Disp: , Rfl:    aspirin EC 81 MG tablet, Take 81 mg by mouth daily. Swallow whole., Disp: , Rfl:    Biotin 1000 MCG tablet, Take 1,000 mcg by mouth daily., Disp: , Rfl:    Boswellia-Glucosamine-Vit D (GLUCOSAMINE COMPLEX -BOSWELLIA PO), Take 1,500 mg by mouth daily., Disp: , Rfl:    Cholecalciferol (VITAMIN D-1000 MAX ST) 25 MCG (1000 UT) tablet, Take 1,000 Units by mouth daily., Disp: , Rfl:    Coenzyme Q10 (CO Q 10) 100 MG CAPS, Take 100 mg by mouth daily., Disp: , Rfl:    ezetimibe (ZETIA) 10 MG tablet, TAKE ONE TABLET BY MOUTH ONE TIME DAILY, Disp: 90 tablet, Rfl: 2   fluticasone (FLONASE) 50 MCG/ACT nasal spray, Place 1 spray into both nostrils at bedtime., Disp: , Rfl:    Garlic 1000 MG CAPS, Take 1,000 mg by mouth daily., Disp: , Rfl:    Multiple Vitamins-Minerals (MULTIVITAMIN WITH MINERALS) tablet, Take 1 tablet by mouth daily. Men, Disp: , Rfl:    olmesartan (BENICAR) 20 MG tablet, Take 1 tablet (20 mg total) by mouth daily., Disp: 90 tablet, Rfl: 3   Omega-3 Fatty Acids (OMEGA 3 PO), Take 1,500 mg by mouth daily., Disp: , Rfl:    rosuvastatin (CRESTOR) 40 MG tablet, TAKE ONE TABLET BY MOUTH ONE TIME DAILY, Disp: 90 tablet, Rfl:  2   vortioxetine HBr (TRINTELLIX) 20 MG TABS tablet, Take 1 tablet (20 mg total) by mouth daily., Disp: 30 tablet, Rfl: 0  Past Medical History: Past Medical History:  Diagnosis Date   Anemia    hx of in 1992    Anxiety    Arthritis    lower back    Back pain    Blockage of coronary artery of heart (HCC)    Constipation    Dysrhythmia    PVCs   Generalized muscle weakness    Hearing loss    Heart murmur    History of anxiety    History of hiatal hernia    Hyperlipemia    Hypertension    IBS (irritable bowel syndrome)    Joint pain    Lower urinary tract symptoms (LUTS)    Malignant neoplasm of prostate (HCC) 04/17/2014   Other fatigue    Pneumonia    hx x2 of pneumonia at age 58 and bronchitis x3   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   Shortness of breath on exertion    Ventral hernia without obstruction or gangrene 03/27/2016   Vitamin D deficiency     Tobacco Use: Social History   Tobacco Use  Smoking Status Never  Smokeless Tobacco Never    Labs: Review Flowsheet  More data exists      Latest Ref Rng &  Units 06/24/2021 01/08/2022 10/19/2022 05/07/2023 05/11/2023  Labs for ITP Cardiac and Pulmonary Rehab  Cholestrol 100 - 199 mg/dL - 161  - - -  LDL (calc) 0 - 99 mg/dL - 48  - - -  HDL-C >09 mg/dL - 40  - - -  Trlycerides 0 - 149 mg/dL - 83  - - -  Hemoglobin A1c 4.8 - 5.6 % 5.6  - - 5.7  -  PH, Arterial 7.35 - 7.45 - - - - 7.330  7.314  7.316  7.348  7.391  7.366  7.406  7.362   PCO2 arterial 32 - 48 mmHg - - - - 44.8  46.3  45.7  43.3  40.6  43.0  39.2  44.0   Bicarbonate 20.0 - 28.0 mmol/L - - 26.9  22.8  - 23.6  23.5  23.3  23.8  24.6  24.7  24.6  24.1  25.0   TCO2 22 - 32 mmol/L - - 28  24  - 25  25  25  23  25  26  25  26  26  26  26  25  24  23  26    Acid-base deficit 0.0 - 2.0 mmol/L - - 2.0  - 2.0  3.0  3.0  2.0  1.0  1.0  1.0   O2 Saturation % - - 75  96  - 94  96  96  91  100  100  100  76  99     Details       Multiple values  from one day are sorted in reverse-chronological order         Capillary Blood Glucose: Lab Results  Component Value Date   GLUCAP 106 (H) 05/14/2023   GLUCAP 118 (H) 05/13/2023   GLUCAP 123 (H) 05/13/2023   GLUCAP 121 (H) 05/13/2023   GLUCAP 159 (H) 05/13/2023     Exercise Target Goals: Exercise Program Goal: Individual exercise prescription set using results from initial 6 min walk test and THRR while considering  patient's activity barriers and safety.   Exercise Prescription Goal: Initial exercise prescription builds to 30-45 minutes a day of aerobic activity, 2-3 days per week.  Home exercise guidelines will be given to patient during program as part of exercise prescription that the participant will acknowledge.  Activity Barriers & Risk Stratification:  Activity Barriers & Cardiac Risk Stratification - 06/23/23 1500       Activity Barriers & Cardiac Risk Stratification   Activity Barriers Incisional Pain;Back Problems    Cardiac Risk Stratification High   <5 METs on            6 Minute Walk:  6 Minute Walk     Row Name 06/23/23 1559         6 Minute Walk   Phase Initial     Distance 1510 feet     Walk Time 6 minutes     # of Rest Breaks 0     MPH 2.86     METS 3.31     RPE 11     Perceived Dyspnea  0     VO2 Peak 11.59     Symptoms Yes (comment)     Comments 3/10 chronic posterior hip pain- resolved once seated     Resting HR 62 bpm     Resting BP 136/82     Resting Oxygen Saturation  97 %     Exercise Oxygen Saturation  during 6  min walk 97 %     Max Ex. HR 113 bpm     Max Ex. BP 178/88     2 Minute Post BP 140/86              Oxygen Initial Assessment:   Oxygen Re-Evaluation:   Oxygen Discharge (Final Oxygen Re-Evaluation):   Initial Exercise Prescription:  Initial Exercise Prescription - 06/23/23 1600       Date of Initial Exercise RX and Referring Provider   Date 06/23/23    Referring Provider Epifanio Lesches,  MD    Expected Discharge Date 09/15/23      Recumbant Bike   Level 2    RPM 50    Watts 30    Minutes 15    METs 2.5      NuStep   Level 1    SPM 70    Minutes 15    METs 2      Prescription Details   Frequency (times per week) 3    Duration Progress to 30 minutes of continuous aerobic without signs/symptoms of physical distress      Intensity   THRR 40-80% of Max Heartrate 61-122    Ratings of Perceived Exertion 11-13    Perceived Dyspnea 0-4      Progression   Progression Continue progressive overload as per policy without signs/symptoms or physical distress.      Resistance Training   Training Prescription Yes    Weight 3    Reps 10-15             Perform Capillary Blood Glucose checks as needed.  Exercise Prescription Changes:   Exercise Comments:   Exercise Goals and Review:   Exercise Goals     Row Name 06/23/23 1501             Exercise Goals   Increase Physical Activity Yes       Intervention Provide advice, education, support and counseling about physical activity/exercise needs.;Develop an individualized exercise prescription for aerobic and resistive training based on initial evaluation findings, risk stratification, comorbidities and participant's personal goals.       Expected Outcomes Short Term: Attend rehab on a regular basis to increase amount of physical activity.;Long Term: Exercising regularly at least 3-5 days a week.;Long Term: Add in home exercise to make exercise part of routine and to increase amount of physical activity.       Increase Strength and Stamina Yes       Intervention Provide advice, education, support and counseling about physical activity/exercise needs.;Develop an individualized exercise prescription for aerobic and resistive training based on initial evaluation findings, risk stratification, comorbidities and participant's personal goals.       Expected Outcomes Short Term: Increase workloads from initial exercise  prescription for resistance, speed, and METs.;Short Term: Perform resistance training exercises routinely during rehab and add in resistance training at home;Long Term: Improve cardiorespiratory fitness, muscular endurance and strength as measured by increased METs and functional capacity ( )       Able to understand and use rate of perceived exertion (RPE) scale Yes       Intervention Provide education and explanation on how to use RPE scale       Expected Outcomes Long Term:  Able to use RPE to guide intensity level when exercising independently;Short Term: Able to use RPE daily in rehab to express subjective intensity level       Knowledge and understanding of Target Heart Rate Range (THRR) Yes  Intervention Provide education and explanation of THRR including how the numbers were predicted and where they are located for reference       Expected Outcomes Short Term: Able to state/look up THRR;Short Term: Able to use daily as guideline for intensity in rehab;Long Term: Able to use THRR to govern intensity when exercising independently       Understanding of Exercise Prescription Yes       Intervention Provide education, explanation, and written materials on patient's individual exercise prescription       Expected Outcomes Short Term: Able to explain program exercise prescription;Long Term: Able to explain home exercise prescription to exercise independently                Exercise Goals Re-Evaluation :   Discharge Exercise Prescription (Final Exercise Prescription Changes):   Nutrition:  Target Goals: Understanding of nutrition guidelines, daily intake of sodium 1500mg , cholesterol 200mg , calories 30% from fat and 7% or less from saturated fats, daily to have 5 or more servings of fruits and vegetables.  Biometrics:  Pre Biometrics - 06/23/23 1455       Pre Biometrics   Waist Circumference 52 inches    Hip Circumference 48 inches    Waist to Hip Ratio 1.08 %    Triceps  Skinfold 25 mm    % Body Fat 38.9 %    Grip Strength 50 kg    Flexibility --   not done due to back pain   Single Leg Stand 23.81 seconds              Nutrition Therapy Plan and Nutrition Goals:   Nutrition Assessments:  MEDIFICTS Score Key: >=70 Need to make dietary changes  40-70 Heart Healthy Diet <= 40 Therapeutic Level Cholesterol Diet    Picture Your Plate Scores: <16 Unhealthy dietary pattern with much room for improvement. 41-50 Dietary pattern unlikely to meet recommendations for good health and room for improvement. 51-60 More healthful dietary pattern, with some room for improvement.  >60 Healthy dietary pattern, although there may be some specific behaviors that could be improved.    Nutrition Goals Re-Evaluation:   Nutrition Goals Re-Evaluation:   Nutrition Goals Discharge (Final Nutrition Goals Re-Evaluation):   Psychosocial: Target Goals: Acknowledge presence or absence of significant depression and/or stress, maximize coping skills, provide positive support system. Participant is able to verbalize types and ability to use techniques and skills needed for reducing stress and depression.  Initial Review & Psychosocial Screening:  Initial Psych Review & Screening - 06/23/23 1501       Initial Review   Current issues with Current Stress Concerns    Source of Stress Concerns Family    Comments Geovany shared that he has some feelings of stress related to his wifes current illness. His wife has had 5 brain/skull surgeries in the past year and this has been very difficult for Bronislaw and his family. He has ways to relieve his stress and shared that they work well for him. Eliasar denies any feelings of anxiety/depression. Support offered, Nygel denies any need for additional resources at this time.      Family Dynamics   Good Support System? Yes   Wife and sons for support     Barriers   Psychosocial barriers to participate in program The patient should benefit  from training in stress management and relaxation.      Screening Interventions   Interventions Provide feedback about the scores to participant;To provide support and resources with  identified psychosocial needs;Encouraged to exercise    Expected Outcomes Long Term Goal: Stressors or current issues are controlled or eliminated.;Short Term goal: Identification and review with participant of any Quality of Life or Depression concerns found by scoring the questionnaire.;Long Term goal: The participant improves quality of Life and PHQ9 Scores as seen by post scores and/or verbalization of changes             Quality of Life Scores:  Quality of Life - 06/23/23 1601       Quality of Life   Select Quality of Life      Quality of Life Scores   Health/Function Pre 28.1 %    Socioeconomic Pre 28.93 %    Psych/Spiritual Pre 28.21 %    Family Pre 27.6 %    GLOBAL Pre 28.18 %            Scores of 19 and below usually indicate a poorer quality of life in these areas.  A difference of  2-3 points is a clinically meaningful difference.  A difference of 2-3 points in the total score of the Quality of Life Index has been associated with significant improvement in overall quality of life, self-image, physical symptoms, and general health in studies assessing change in quality of life.  PHQ-9: Review Flowsheet       06/23/2023 06/10/2021  Depression screen PHQ 2/9  Decreased Interest 0 1  Down, Depressed, Hopeless 0 1  PHQ - 2 Score 0 2  Altered sleeping 0 2  Tired, decreased energy 0 2  Change in appetite 0 1  Feeling bad or failure about yourself  0 2  Trouble concentrating 0 1  Moving slowly or fidgety/restless 0 2  Suicidal thoughts 0 0  PHQ-9 Score 0 12  Difficult doing work/chores - Somewhat difficult   Interpretation of Total Score  Total Score Depression Severity:  1-4 = Minimal depression, 5-9 = Mild depression, 10-14 = Moderate depression, 15-19 = Moderately severe  depression, 20-27 = Severe depression   Psychosocial Evaluation and Intervention:   Psychosocial Re-Evaluation:   Psychosocial Discharge (Final Psychosocial Re-Evaluation):   Vocational Rehabilitation: Provide vocational rehab assistance to qualifying candidates.   Vocational Rehab Evaluation & Intervention:  Vocational Rehab - 06/23/23 1503       Initial Vocational Rehab Evaluation & Intervention   Assessment shows need for Vocational Rehabilitation No   Trevyon is retired and works part time at [employer]: Education Goals: Education classes will be provided on a weekly basis, covering required topics. Participant will state understanding/return demonstration of topics presented.     Core Videos: Exercise    Move It!  Clinical staff conducted group or individual video education with verbal and written material and guidebook.  Patient learns the recommended Pritikin exercise program. Exercise with the goal of living a long, healthy life. Some of the health benefits of exercise include controlled diabetes, healthier blood pressure levels, improved cholesterol levels, improved heart and lung capacity, improved sleep, and better body composition. Everyone should speak with their doctor before starting or changing an exercise routine.  Biomechanical Limitations Clinical staff conducted group or individual video education with verbal and written material and guidebook.  Patient learns how biomechanical limitations can impact exercise and how we can mitigate and possibly overcome limitations to have an impactful and balanced exercise routine.  Body Composition Clinical staff conducted group or individual video education with verbal and written material  and guidebook.  Patient learns that body composition (ratio of muscle mass to fat mass) is a key component to assessing overall fitness, rather than body weight alone. Increased fat mass, especially visceral belly  fat, can put [place] at increased risk for metabolic syndrome, type 2 diabetes, heart disease, and even death. It is recommended to combine diet and exercise (cardiovascular and resistance training) to improve your body composition. Seek guidance from your physician and exercise physiologist before implementing an exercise routine.  Exercise Action Plan Clinical staff conducted group or individual video education with verbal and written material and guidebook.  Patient learns the recommended strategies to achieve and enjoy long-term exercise adherence, including variety, self-motivation, self-efficacy, and positive decision making. Benefits of exercise include fitness, good health, weight management, more energy, better sleep, less stress, and overall well-being.  Medical   Heart Disease Risk Reduction Clinical staff conducted group or individual video education with verbal and written material and guidebook.  Patient learns our heart is our most vital organ as it circulates oxygen, nutrients, white blood cells, and hormones throughout the entire body, and carries waste away. Data supports a plant-based eating plan like the Pritikin Program for its effectiveness in slowing progression of and reversing heart disease. The video provides a number of recommendations to address heart disease.   Metabolic Syndrome and Belly Fat  Clinical staff conducted group or individual video education with verbal and written material and guidebook.  Patient learns what metabolic syndrome is, how it leads to heart disease, and how one can reverse it and keep it from coming back. You have metabolic syndrome if you have 3 of the following 5 criteria: abdominal obesity, high blood pressure, high triglycerides, low HDL cholesterol, and high blood sugar.  Hypertension and Heart Disease Clinical staff conducted group or individual video education with verbal and written material and guidebook.  Patient learns that high blood  pressure, or hypertension, is very common in the [place]. Hypertension is largely due to excessive salt intake, but other important risk factors include being overweight, physical inactivity, drinking too much alcohol, smoking, and not eating enough potassium from fruits and vegetables. High blood pressure is a leading risk factor for heart attack, stroke, congestive heart failure, dementia, kidney failure, and premature death. Long-term effects of excessive salt intake include stiffening of the arteries and thickening of heart muscle and organ damage. Recommendations include ways to reduce hypertension and the risk of heart disease.  Diseases of Our Time - Focusing on Diabetes Clinical staff conducted group or individual video education with verbal and written material and guidebook.  Patient learns why the best way to stop diseases of our time is prevention, through food and other lifestyle changes. Medicine (such as prescription pills and surgeries) is often only a Band-Aid on the problem, not a long-term solution. Most common diseases of our time include obesity, type 2 diabetes, hypertension, heart disease, and cancer. The Pritikin Program is recommended and has been proven to help reduce, reverse, and/or prevent the damaging effects of metabolic syndrome.  Nutrition   Overview of the Pritikin Eating Plan  Clinical staff conducted group or individual video education with verbal and written material and guidebook.  Patient learns about the Pritikin Eating Plan for disease risk reduction. The Pritikin Eating Plan emphasizes a wide variety of unrefined, minimally-processed carbohydrates, like fruits, vegetables, whole grains, and legumes. Go, Caution, and Stop food choices are explained. Plant-based and lean animal proteins are emphasized. Rationale provided for low sodium intake for  blood pressure control, low added sugars for blood sugar stabilization, and low added fats and oils for coronary  artery disease risk reduction and weight management.  Calorie Density  Clinical staff conducted group or individual video education with verbal and written material and guidebook.  Patient learns about calorie density and how it impacts the Pritikin Eating Plan. Knowing the characteristics of the food you choose will help you decide whether those foods will lead to weight gain or weight loss, and whether you want to consume more or less of them. Weight loss is usually a side effect of the Pritikin Eating Plan because of its focus on low calorie-dense foods.  Label Reading  Clinical staff conducted group or individual video education with verbal and written material and guidebook.  Patient learns about the Pritikin recommended label reading guidelines and corresponding recommendations regarding calorie density, added sugars, sodium content, and whole grains.  Dining Out - Part 1  Clinical staff conducted group or individual video education with verbal and written material and guidebook.  Patient learns that restaurant meals can be sabotaging because they can be so high in calories, fat, sodium, and/or sugar. Patient learns recommended strategies on how to positively address this and avoid unhealthy pitfalls.  Facts on Fats  Clinical staff conducted group or individual video education with verbal and written material and guidebook.  Patient learns that lifestyle modifications can be just as effective, if not more so, as many medications for lowering your risk of heart disease. A Pritikin lifestyle can help to reduce your risk of inflammation and atherosclerosis (cholesterol build-up, or plaque, in the artery walls). Lifestyle interventions such as dietary choices and physical activity address the cause of atherosclerosis. A review of the types of fats and their impact on blood cholesterol levels, along with dietary recommendations to reduce fat intake is also included.  Nutrition Action Plan   Clinical staff conducted group or individual video education with verbal and written material and guidebook.  Patient learns how to incorporate Pritikin recommendations into their lifestyle. Recommendations include planning and keeping personal health goals in mind as an important part of their success.  Healthy Mind-Set    Healthy Minds, Bodies, Hearts  Clinical staff conducted group or individual video education with verbal and written material and guidebook.  Patient learns how to identify when they are stressed. Video will discuss the impact of that stress, as well as the many benefits of stress management. Patient will also be introduced to stress management techniques. The way we think, act, and feel has an impact on our hearts.  How Our Thoughts Can Heal Our Hearts  Clinical staff conducted group or individual video education with verbal and written material and guidebook.  Patient learns that negative thoughts can cause depression and anxiety. This can result in negative lifestyle behavior and serious health problems. Cognitive behavioral therapy is an effective method to help control our thoughts in order to change and improve our emotional outlook.  Additional Videos:  Exercise    Improving Performance  Clinical staff conducted group or individual video education with verbal and written material and guidebook.  Patient learns to use a non-linear approach by alternating intensity levels and lengths of time spent exercising to help burn more calories and lose more body fat. Cardiovascular exercise helps improve heart health, metabolism, hormonal balance, blood sugar control, and recovery from fatigue. Resistance training improves strength, endurance, balance, coordination, reaction time, metabolism, and muscle mass. Flexibility exercise improves circulation, posture, and balance. Seek guidance  from your physician and exercise physiologist before implementing an exercise routine and learn  your capabilities and proper form for all exercise.  Introduction to Yoga  Clinical staff conducted group or individual video education with verbal and written material and guidebook.  Patient learns about yoga, a discipline of the coming together of mind, breath, and body. The benefits of yoga include improved flexibility, improved range of motion, better posture and core strength, increased lung function, weight loss, and positive self-image. Yoga's heart health benefits include lowered blood pressure, healthier heart rate, decreased cholesterol and triglyceride levels, improved immune function, and reduced stress. Seek guidance from your physician and exercise physiologist before implementing an exercise routine and learn your capabilities and proper form for all exercise.  Medical   Aging: Enhancing Your Quality of Life  Clinical staff conducted group or individual video education with verbal and written material and guidebook.  Patient learns key strategies and recommendations to stay in good physical health and enhance quality of life, such as prevention strategies, having an advocate, securing a Health Care Proxy and Power of Attorney, and keeping a list of medications and system for tracking them. It also discusses how to avoid risk for bone loss.  Biology of Weight Control  Clinical staff conducted group or individual video education with verbal and written material and guidebook.  Patient learns that weight gain occurs because we consume more calories than we burn (eating more, moving less). Even if your body weight is normal, you may have higher ratios of fat compared to muscle mass. Too much body fat puts you at increased risk for cardiovascular disease, heart attack, stroke, type 2 diabetes, and obesity-related cancers. In addition to exercise, following the Pritikin Eating Plan can help reduce your risk.  Decoding Lab Results  Clinical staff conducted group or individual video education  with verbal and written material and guidebook.  Patient learns that lab test reflects one measurement whose values change over time and are influenced by many factors, including medication, stress, sleep, exercise, food, hydration, pre-existing medical conditions, and more. It is recommended to use the knowledge from this video to become more involved with your lab results and evaluate your numbers to speak with your doctor.   Diseases of Our Time - Overview  Clinical staff conducted group or individual video education with verbal and written material and guidebook.  Patient learns that according to the CDC, 50% to 70% of chronic diseases (such as obesity, type 2 diabetes, elevated lipids, hypertension, and heart disease) are avoidable through lifestyle improvements including healthier food choices, listening to satiety cues, and increased physical activity.  Sleep Disorders Clinical staff conducted group or individual video education with verbal and written material and guidebook.  Patient learns how good quality and duration of sleep are important to overall health and well-being. Patient also learns about sleep disorders and how they impact health along with recommendations to address them, including discussing with a physician.  Nutrition  Dining Out - Part 2 Clinical staff conducted group or individual video education with verbal and written material and guidebook.  Patient learns how to plan ahead and communicate in order to maximize their dining experience in a healthy and nutritious manner. Included are recommended food choices based on the type of restaurant the patient is visiting.   Fueling a Banker conducted group or individual video education with verbal and written material and guidebook.  There is a strong connection between our food choices and our health.  Diseases like obesity and type 2 diabetes are very prevalent and are in large-part due to lifestyle  choices. The Pritikin Eating Plan provides plenty of food and hunger-curbing satisfaction. It is easy to follow, affordable, and helps reduce health risks.  Menu Workshop  Clinical staff conducted group or individual video education with verbal and written material and guidebook.  Patient learns that restaurant meals can sabotage health goals because they are often packed with calories, fat, sodium, and sugar. Recommendations include strategies to plan ahead and to communicate with the manager, chef, or server to help order a healthier meal.  Planning Your Eating Strategy  Clinical staff conducted group or individual video education with verbal and written material and guidebook.  Patient learns about the Pritikin Eating Plan and its benefit of reducing the risk of disease. The Pritikin Eating Plan does not focus on calories. Instead, it emphasizes high-quality, nutrient-rich foods. By knowing the characteristics of the foods, we choose, we can determine their calorie density and make informed decisions.  Targeting Your Nutrition Priorities  Clinical staff conducted group or individual video education with verbal and written material and guidebook.  Patient learns that lifestyle habits have a tremendous impact on disease risk and progression. This video provides eating and physical activity recommendations based on your personal health goals, such as reducing LDL cholesterol, losing weight, preventing or controlling type 2 diabetes, and reducing high blood pressure.  Vitamins and Minerals  Clinical staff conducted group or individual video education with verbal and written material and guidebook.  Patient learns different ways to obtain key vitamins and minerals, including through a recommended healthy diet. It is important to discuss all supplements you take with your doctor.   Healthy Mind-Set    Smoking Cessation  Clinical staff conducted group or individual video education with verbal and  written material and guidebook.  Patient learns that cigarette smoking and tobacco addiction pose a serious health risk which affects millions of people. Stopping smoking will significantly reduce the risk of heart disease, lung disease, and many forms of cancer. Recommended strategies for quitting are covered, including working with your doctor to develop a successful plan.  Culinary   Becoming a Set designer conducted group or individual video education with verbal and written material and guidebook.  Patient learns that cooking at home can be healthy, cost-effective, quick, and puts them in control. Keys to cooking healthy recipes will include looking at your recipe, assessing your equipment needs, planning ahead, making it simple, choosing cost-effective seasonal ingredients, and limiting the use of added fats, salts, and sugars.  Cooking - Breakfast and Snacks  Clinical staff conducted group or individual video education with verbal and written material and guidebook.  Patient learns how important breakfast is to satiety and nutrition through the entire day. Recommendations include key foods to eat during breakfast to help stabilize blood sugar levels and to prevent overeating at meals later in the day. Planning ahead is also a key component.  Cooking - Educational psychologist conducted group or individual video education with verbal and written material and guidebook.  Patient learns eating strategies to improve overall health, including an approach to cook more at home. Recommendations include thinking of animal protein as a side on your plate rather than center stage and focusing instead on lower calorie dense options like vegetables, fruits, whole grains, and plant-based proteins, such as beans. Making sauces in large quantities to freeze for later and leaving the skin on  your vegetables are also recommended to maximize your experience.  Cooking - Healthy Salads and  Dressing Clinical staff conducted group or individual video education with verbal and written material and guidebook.  Patient learns that vegetables, fruits, whole grains, and legumes are the foundations of the Pritikin Eating Plan. Recommendations include how to incorporate each of these in flavorful and healthy salads, and how to create homemade salad dressings. Proper handling of ingredients is also covered. Cooking - Soups and [employer] - Soups and Desserts Clinical staff conducted group or individual video education with verbal and written material and guidebook.  Patient learns that Pritikin soups and desserts make for easy, nutritious, and delicious snacks and meal components that are low in sodium, fat, sugar, and calorie density, while high in vitamins, minerals, and filling fiber. Recommendations include simple and healthy ideas for soups and desserts.   Overview     The Pritikin Solution Program Overview Clinical staff conducted group or individual video education with verbal and written material and guidebook.  Patient learns that the results of the Pritikin Program have been documented in more than 100 articles published in peer-reviewed journals, and the benefits include reducing risk factors for (and, in some cases, even reversing) high cholesterol, high blood pressure, type 2 diabetes, obesity, and more! An overview of the three key pillars of the Pritikin Program will be covered: eating well, doing regular exercise, and having a healthy mind-set.  WORKSHOPS  Exercise: Exercise Basics: Building Your Action Plan Clinical staff led group instruction and group discussion with PowerPoint presentation and patient guidebook. To enhance the learning environment the use of posters, models and videos may be added. At the conclusion of this workshop, patients will comprehend the difference between physical activity and exercise, as well as the benefits of incorporating both, into  their routine. Patients will understand the FITT (Frequency, Intensity, Time, and Type) principle and how to use it to build an exercise action plan. In addition, safety concerns and other considerations for exercise and cardiac rehab will be addressed by the presenter. The purpose of this lesson is to promote a comprehensive and effective weekly exercise routine in order to improve patients' overall level of fitness.   Managing Heart Disease: Your Path to a Healthier Heart Clinical staff led group instruction and group discussion with PowerPoint presentation and patient guidebook. To enhance the learning environment the use of posters, models and videos may be added.At the conclusion of this workshop, patients will understand the anatomy and physiology of the heart. Additionally, they will understand how Pritikin's three pillars impact the risk factors, the progression, and the management of heart disease.  The purpose of this lesson is to provide a high-level overview of the heart, heart disease, and how the Pritikin lifestyle positively impacts risk factors.  Exercise Biomechanics Clinical staff led group instruction and group discussion with PowerPoint presentation and patient guidebook. To enhance the learning environment the use of posters, models and videos may be added. Patients will learn how the structural parts of their bodies function and how these functions impact their daily activities, movement, and exercise. Patients will learn how to promote a neutral spine, learn how to manage pain, and identify ways to improve their physical movement in order to promote healthy living. The purpose of this lesson is to expose patients to common physical limitations that impact physical activity. Participants will learn practical ways to adapt and manage aches and pains, and to minimize their effect on regular exercise. Patients will  learn how to maintain good posture while sitting, walking, and  lifting.  Balance Training and Fall Prevention  Clinical staff led group instruction and group discussion with PowerPoint presentation and patient guidebook. To enhance the learning environment the use of posters, models and videos may be added. At the conclusion of this workshop, patients will understand the importance of their sensorimotor skills (vision, proprioception, and the vestibular system) in maintaining their ability to balance as they age. Patients will apply a variety of balancing exercises that are appropriate for their current level of function. Patients will understand the common causes for poor balance, possible solutions to these problems, and ways to modify their physical environment in order to minimize their fall risk. The purpose of this lesson is to teach patients about the importance of maintaining balance as they age and ways to minimize their risk of falling.  WORKSHOPS   Nutrition:  Fueling a Ship broker led group instruction and group discussion with PowerPoint presentation and patient guidebook. To enhance the learning environment the use of posters, models and videos may be added. Patients will review the foundational principles of the Pritikin Eating Plan and understand what constitutes a serving size in each of the food groups. Patients will also learn Pritikin-friendly foods that are better choices when away from home and review make-ahead meal and snack options. Calorie density will be reviewed and applied to three nutrition priorities: weight maintenance, weight loss, and weight gain. The purpose of this lesson is to reinforce (in a group setting) the key concepts around what patients are recommended to eat and how to apply these guidelines when away from home by planning and selecting Pritikin-friendly options. Patients will understand how calorie density may be adjusted for different weight management goals.  Mindful Eating  Clinical staff led  group instruction and group discussion with PowerPoint presentation and patient guidebook. To enhance the learning environment the use of posters, models and videos may be added. Patients will briefly review the concepts of the Pritikin Eating Plan and the importance of low-calorie dense foods. The concept of mindful eating will be introduced as well as the importance of paying attention to internal hunger signals. Triggers for non-hunger eating and techniques for dealing with triggers will be explored. The purpose of this lesson is to provide patients with the opportunity to review the basic principles of the Pritikin Eating Plan, discuss the value of eating mindfully and how to measure internal cues of hunger and fullness using the Hunger Scale. Patients will also discuss reasons for non-hunger eating and learn strategies to use for controlling emotional eating.  Targeting Your Nutrition Priorities Clinical staff led group instruction and group discussion with PowerPoint presentation and patient guidebook. To enhance the learning environment the use of posters, models and videos may be added. Patients will learn how to determine their genetic susceptibility to disease by reviewing their family history. Patients will gain insight into the importance of diet as part of an overall healthy lifestyle in mitigating the impact of genetics and other environmental insults. The purpose of this lesson is to provide patients with the opportunity to assess their personal nutrition priorities by looking at their family history, their own health history and current risk factors. Patients will also be able to discuss ways of prioritizing and modifying the Pritikin Eating Plan for their highest risk areas  Menu  Clinical staff led group instruction and group discussion with PowerPoint presentation and patient guidebook. To enhance the learning environment the use  of posters, models and videos may be added. Using menus  brought in from [employer], or printed from [employer], patients will apply the Pritikin dining out guidelines that were presented in the [employer] video. Patients will also be able to practice these guidelines in a variety of provided scenarios. The purpose of this lesson is to provide patients with the opportunity to practice hands-on learning of the Pritikin Dining Out guidelines with actual menus and practice scenarios.  Label Reading Clinical staff led group instruction and group discussion with PowerPoint presentation and patient guidebook. To enhance the learning environment the use of posters, models and videos may be added. Patients will review and discuss the Pritikin label reading guidelines presented in Pritikin's Label Reading Educational series video. Using fool labels brought in from local grocery stores and markets, patients will apply the label reading guidelines and determine if the packaged food meet the Pritikin guidelines. The purpose of this lesson is to provide patients with the opportunity to review, discuss, and practice hands-on learning of the Pritikin Label Reading guidelines with actual packaged food labels. Cooking School  Pritikin's [employer] are designed to teach patients ways to prepare quick, simple, and affordable recipes at home. The importance of nutrition's role in chronic disease risk reduction is reflected in its emphasis in the overall Pritikin program. By learning how to prepare essential core Pritikin Eating Plan recipes, patients will increase control over what they eat; be able to customize the flavor of foods without the use of added salt, sugar, or fat; and improve the quality of the food they consume. By learning a set of core recipes which are easily assembled, quickly prepared, and affordable, patients are more likely to prepare more healthy foods at home. These workshops focus on convenient breakfasts, simple  entres, side dishes, and desserts which can be prepared with minimal effort and are consistent with nutrition recommendations for cardiovascular risk reduction. Cooking [employer] are taught by a Armed forces logistics/support/administrative officer (RD) who has been trained by the [employer]. The chef or RD has a clear understanding of the importance of minimizing - if not completely eliminating - added fat, sugar, and sodium in recipes. Throughout the series of Cooking School Workshop sessions, patients will learn about healthy ingredients and efficient methods of cooking to build confidence in their capability to prepare    Cooking School weekly topics:  Adding Flavor- Sodium-Free  Fast and Healthy Breakfasts  Powerhouse Plant-Based Proteins  Satisfying Salads and Dressings  Simple Sides and Sauces  International Cuisine-Spotlight on the [employer] Zones  Delicious Desserts  Savory Soups  [employer] - Meals in a Astronomer Appetizers and Snacks  Comforting Weekend Breakfasts  One-Pot Wonders   Fast Evening Meals  Landscape architect Your Pritikin Plate  WORKSHOPS   Healthy Mindset (Psychosocial):  Focused Goals, Sustainable Changes Clinical staff led group instruction and group discussion with PowerPoint presentation and patient guidebook. To enhance the learning environment the use of posters, models and videos may be added. Patients will be able to apply effective goal setting strategies to establish at least one personal goal, and then take consistent, meaningful action toward that goal. They will learn to identify common barriers to achieving personal goals and develop strategies to overcome them. Patients will also gain an understanding of how our mind-set can impact our ability to achieve goals and the importance of cultivating a positive and growth-oriented mind-set. The purpose of this lesson  is to provide patients with a deeper understanding of how to set and  achieve personal goals, as well as the tools and strategies needed to overcome common obstacles which may arise along the way.  From Head to Heart: The Power of a Healthy Outlook  Clinical staff led group instruction and group discussion with PowerPoint presentation and patient guidebook. To enhance the learning environment the use of posters, models and videos may be added. Patients will be able to recognize and describe the impact of emotions and mood on physical health. They will discover the importance of self-care and explore self-care practices which may work for them. Patients will also learn how to utilize the 4 C's to cultivate a healthier outlook and better manage stress and challenges. The purpose of this lesson is to demonstrate to patients how a healthy outlook is an essential part of maintaining good health, especially as they continue their cardiac rehab journey.  Healthy Sleep for a Healthy Heart Clinical staff led group instruction and group discussion with PowerPoint presentation and patient guidebook. To enhance the learning environment the use of posters, models and videos may be added. At the conclusion of this workshop, patients will be able to demonstrate knowledge of the importance of sleep to overall health, well-being, and quality of life. They will understand the symptoms of, and treatments for, common sleep disorders. Patients will also be able to identify daytime and nighttime behaviors which impact sleep, and they will be able to apply these tools to help manage sleep-related challenges. The purpose of this lesson is to provide patients with a general overview of sleep and outline the importance of quality sleep. Patients will learn about a few of the most common sleep disorders. Patients will also be introduced to the concept of "sleep hygiene," and discover ways to self-manage certain sleeping problems through simple daily behavior changes. Finally, the workshop will motivate  patients by clarifying the links between quality sleep and their goals of heart-healthy living.   Recognizing and Reducing Stress Clinical staff led group instruction and group discussion with PowerPoint presentation and patient guidebook. To enhance the learning environment the use of posters, models and videos may be added. At the conclusion of this workshop, patients will be able to understand the types of stress reactions, differentiate between acute and chronic stress, and recognize the impact that chronic stress has on their health. They will also be able to apply different coping mechanisms, such as reframing negative self-talk. Patients will have the opportunity to practice a variety of stress management techniques, such as deep abdominal breathing, progressive muscle relaxation, and/or guided imagery.  The purpose of this lesson is to educate patients on the role of stress in their lives and to provide healthy techniques for coping with it.  Learning Barriers/Preferences:  Learning Barriers/Preferences - 06/23/23 1503       Learning Barriers/Preferences   Learning Barriers Sight   glasses   Learning Preferences Audio;Computer/Internet;Group Instruction;Individual Instruction;Skilled Demonstration;Verbal Instruction;Written Material;Video;Pictoral             Education Topics:  Knowledge Questionnaire Score:  Knowledge Questionnaire Score - 06/23/23 1503       Knowledge Questionnaire Score   Pre Score 22/24             Core Components/Risk Factors/Patient Goals at Admission:  Personal Goals and Risk Factors at Admission - 06/23/23 1503       Core Components/Risk Factors/Patient Goals on Admission    Weight Management Yes;Obesity;Weight Loss  Intervention Weight Management: Develop a combined nutrition and exercise program designed to reach desired caloric intake, while maintaining appropriate intake of nutrient and fiber, sodium and fats, and appropriate energy  expenditure required for the weight goal.;Weight Management: Provide education and appropriate resources to help participant work on and attain dietary goals.;Weight Management/Obesity: Establish reasonable short term and long term weight goals.;Obesity: Provide education and appropriate resources to help participant work on and attain dietary goals.    Admit Weight 274 lb 14.6 oz (124.7 kg)    Goal Weight: Long Term 250 lb (113.4 kg)   pt goal   Expected Outcomes Short Term: Continue to assess and modify interventions until short term weight is achieved;Long Term: Adherence to nutrition and physical activity/exercise program aimed toward attainment of established weight goal;Weight Loss: Understanding of general recommendations for a balanced deficit meal plan, which promotes 1-2 lb weight loss per week and includes a negative energy balance of [phone] kcal/d;Understanding recommendations for meals to include 15-35% energy as protein, 25-35% energy from fat, 35-60% energy from carbohydrates, less than 200mg  of dietary cholesterol, 20-35 gm of total fiber daily;Understanding of distribution of calorie intake throughout the day with the consumption of 4-5 meals/snacks    Hypertension Yes    Intervention Provide education on lifestyle modifcations including regular physical activity/exercise, weight management, moderate sodium restriction and increased consumption of fresh fruit, vegetables, and low fat dairy, alcohol moderation, and smoking cessation.;Monitor prescription use compliance.    Expected Outcomes Short Term: Continued assessment and intervention until BP is < 140/28mm HG in hypertensive participants. < 130/44mm HG in hypertensive participants with diabetes, heart failure or chronic kidney disease.;Long Term: Maintenance of blood pressure at goal levels.    Lipids Yes    Intervention Provide education and support for participant on nutrition & aerobic/resistive exercise along with prescribed  medications to achieve LDL 70mg , HDL >40mg .    Expected Outcomes Long Term: Cholesterol controlled with medications as prescribed, with individualized exercise RX and with personalized nutrition plan. Value goals: LDL < 70mg , HDL > 40 mg.;Short Term: Participant states understanding of desired cholesterol values and is compliant with medications prescribed. Participant is following exercise prescription and nutrition guidelines.    Stress Yes    Intervention Offer individual and/or small group education and counseling on adjustment to heart disease, stress management and health-related lifestyle change. Teach and support self-help strategies.;Refer participants experiencing significant psychosocial distress to appropriate mental health specialists for further evaluation and treatment. When possible, include family members and significant others in education/counseling sessions.    Expected Outcomes Long Term: Emotional wellbeing is indicated by absence of clinically significant psychosocial distress or social isolation.;Short Term: Participant demonstrates changes in health-related behavior, relaxation and other stress management skills, ability to obtain effective social support, and compliance with psychotropic medications if prescribed.             Core Components/Risk Factors/Patient Goals Review:    Core Components/Risk Factors/Patient Goals at Discharge (Final Review):    ITP Comments:  ITP Comments     Row Name 06/23/23 1455           ITP Comments Dr. Armanda Magic medical director. Introduction to [facility] education/intensive cardiac rehab. Initial orientation packet reviewed with patient.                Comments: Participant attended orientation for the cardiac rehabilitation program on  06/23/2023  to perform initial intake and exercise walk test. Patient introduced to the [facility] education and orientation packet was  reviewed. Completed 6-minute walk test,  measurements, initial ITP, and exercise prescription. Vital signs stable. Telemetry-normal sinus rhythm with frequent PVCs, bigeminy, intermittent LBBB, asymptomatic. RN Carlette and RN Annedrea notified of EKG changes post . See note for details.   Service time was from 1330 to 1545.  [name], MS, ACSM-CEP 06/23/2023 4:02 PM

## 2023-06-23 NOTE — Progress Notes (Signed)
 Cardiac Rehab Medication Review   Does the patient  feel that his/her medications are working for him/her? Yes    Has the patient been experiencing any side effects to the medications prescribed? No  Does the patient measure his/her own blood pressure or blood glucose at home?   Yes  Does the patient have any problems obtaining medications due to transportation or finances?   No  Understanding of regimen: excellent Understanding of indications: excellent Potential of compliance: excellent    Comments: Thomas [name] understands his medications and regime well. He checks his weight everyday and is in the process of getting a new BP cuff since his old one was broken.    [name], MS, ACSM-CEP 06/23/2023 2:59 PM

## 2023-06-23 NOTE — Progress Notes (Signed)
 Made patient aware to stop Metoprolol per Dr. Bjorn Pippin. Understanding verbalized.

## 2023-06-28 ENCOUNTER — Encounter (HOSPITAL_COMMUNITY)
Admission: RE | Admit: 2023-06-28 | Discharge: 2023-06-28 | Disposition: A | Discharge status: Home / Self Care | Source: Ambulatory Visit | Attending: Cardiology | Admitting: Cardiology

## 2023-06-28 DIAGNOSIS — Z952 Presence of prosthetic heart valve: Secondary | ICD-10-CM | POA: Diagnosis not present

## 2023-06-28 NOTE — Progress Notes (Signed)
 Daily Session Note  Patient Details  Name: Thomas [name] MRN: [id number] Date of Birth: [birth year] Referring Provider:   Flowsheet Row INTENSIVE CARDIAC REHAB ORIENT from 06/23/2023 in [facility]  Referring Provider Carson Clara, MD       Encounter Date: 06/28/2023  Check In:  Session Check In - 06/28/23 0708       Check-In   Supervising physician immediately available to respond to emergencies [facility] MD immediately available    Physician(s) Palmer Bobo, NP    Location [facility]    Staff Present France Ina, MS, Exercise Physiologist;Kaylee Nolon Baxter, MS, ACSM-CEP, Exercise Physiologist;Olinty Gaylene Kays, MS, ACSM-CEP, Exercise Physiologist;Illyanna Petillo Hadassah Letters, RN, MHA;Jetta Walker BS, ACSM-CEP, Exercise Physiologist    Virtual Visit No    Medication changes reported     No    Fall or balance concerns reported    No    Tobacco Cessation No Change    Warm-up and Cool-down Performed as group-led instruction    Resistance Training Performed Yes    VAD Patient? No    PAD/SET Patient? No      Pain Assessment   Currently in Pain? No/denies    Pain Score 0-No pain    Multiple Pain Sites No             Capillary Blood Glucose: No results found for this or any previous visit (from the past 24 hours).   Exercise Prescription Changes - 06/28/23 0828       Response to Exercise   Blood Pressure (Admit) 142/78    Blood Pressure (Exercise) 158/82    Blood Pressure (Exit) 132/78    Heart Rate (Admit) 52 bpm    Heart Rate (Exercise) 112 bpm    Heart Rate (Exit) 61 bpm    Rating of Perceived Exertion (Exercise) 11    Perceived Dyspnea (Exercise) 0    Symptoms none    Comments Pt first day in the [facility] program    Duration Progress to 30 minutes of  aerobic without signs/symptoms of physical distress    Intensity THRR unchanged      Progression   Progression Continue to progress  workloads to maintain intensity without signs/symptoms of physical distress.    Average METs 2.2      Resistance Training   Training Prescription Yes    Weight 3    Reps 10-15    Time 10 Minutes      Recumbant Bike   Level 2    RPM 74    Watts 23    Minutes 15    METs 2.3      NuStep   Level 1    SPM 91    Minutes 15    METs 2.1             Social History   Tobacco Use  Smoking Status Never  Smokeless Tobacco Never    Goals Met:  Independence with exercise equipment Exercise tolerated well No report of concerns or symptoms today Strength training completed today  Goals Unmet:  Not Applicable  Comments: Pt in today for cardiac rehab today. Pt tolerated light exercise without difficulty. VSS, telemetry-Sinus Rhythm with occ PVC, asymptomatic. Medication list reconciled on 06/23/23 with no changes to report. Pt denies barriers to medication compliance.  PSYCHOSOCIAL ASSESSMENT:  PHQ9=0. Pt exhibits positive coping skills, hopeful outlook with supportive family. No psychosocial needs identified at this time, no psychosocial interventions at this  time. Pt oriented to exercise equipment and gym routine. Understanding verbalized.    Dr. Gaylyn Keas is Medical Director for Cardiac Rehab at [facility].

## 2023-06-30 ENCOUNTER — Encounter (HOSPITAL_COMMUNITY)
Admission: RE | Admit: 2023-06-30 | Discharge: 2023-06-30 | Disposition: A | Discharge status: Home / Self Care | Source: Ambulatory Visit | Attending: Cardiology | Admitting: Cardiology

## 2023-06-30 DIAGNOSIS — Z952 Presence of prosthetic heart valve: Secondary | ICD-10-CM | POA: Diagnosis not present

## 2023-07-02 ENCOUNTER — Encounter (HOSPITAL_COMMUNITY)
Admission: RE | Admit: 2023-07-02 | Discharge: 2023-07-02 | Disposition: A | Discharge status: Home / Self Care | Source: Ambulatory Visit | Attending: Cardiology | Admitting: Cardiology

## 2023-07-02 DIAGNOSIS — Z952 Presence of prosthetic heart valve: Secondary | ICD-10-CM

## 2023-07-05 ENCOUNTER — Encounter (HOSPITAL_COMMUNITY)
Admission: RE | Admit: 2023-07-05 | Discharge: 2023-07-05 | Disposition: A | Discharge status: Home / Self Care | Source: Ambulatory Visit | Attending: Cardiology

## 2023-07-05 DIAGNOSIS — Z952 Presence of prosthetic heart valve: Secondary | ICD-10-CM | POA: Diagnosis not present

## 2023-07-05 NOTE — Progress Notes (Signed)
 Cardiac Individual Treatment Plan  Patient Details  Name: Thomas [name] MRN: [id number] Date of Birth: 02/27/1956 Referring Provider:   Flowsheet Row INTENSIVE CARDIAC REHAB ORIENT from 06/23/2023 in [facility]  Referring Provider Carson Clara, MD       Initial Encounter Date:  Flowsheet Row INTENSIVE CARDIAC REHAB ORIENT from 06/23/2023 in [facility]  Date 06/23/23       Visit Diagnosis: 05/11/23 S/P AVR (aortic valve replacement)  Patient's Home Medications on Admission:  Current Outpatient Medications:    acetaminophen  (TYLENOL ) 500 MG tablet, Take 1-2 tablets (500-1,000 mg total) by mouth every 6 (six) hours as needed., Disp: , Rfl:    aspirin  EC 81 MG tablet, Take 81 mg by mouth daily. Swallow whole., Disp: , Rfl:    Biotin 1000 MCG tablet, Take 1,000 mcg by mouth daily., Disp: , Rfl:    Boswellia-Glucosamine-Vit D (GLUCOSAMINE COMPLEX -BOSWELLIA PO), Take 1,500 mg by mouth daily., Disp: , Rfl:    Cholecalciferol (VITAMIN D -1000 MAX ST) 25 MCG (1000 UT) tablet, Take 1,000 Units by mouth daily., Disp: , Rfl:    Coenzyme Q10 (CO Q 10) 100 MG CAPS, Take 100 mg by mouth daily., Disp: , Rfl:    ezetimibe  (ZETIA ) 10 MG tablet, TAKE ONE TABLET BY MOUTH ONE TIME DAILY, Disp: 90 tablet, Rfl: 2   fluticasone  (FLONASE ) 50 MCG/ACT nasal spray, Place 1 spray into both nostrils at bedtime., Disp: , Rfl:    Garlic 1000 MG CAPS, Take 1,000 mg by mouth daily., Disp: , Rfl:    Multiple Vitamins-Minerals (MULTIVITAMIN WITH MINERALS) tablet, Take 1 tablet by mouth daily. Men, Disp: , Rfl:    olmesartan  (BENICAR ) 20 MG tablet, Take 1 tablet (20 mg total) by mouth daily., Disp: 90 tablet, Rfl: 3   Omega-3 Fatty Acids (OMEGA 3 PO), Take 1,500 mg by mouth daily., Disp: , Rfl:    rosuvastatin  (CRESTOR ) 40 MG tablet, TAKE ONE TABLET BY MOUTH ONE TIME DAILY, Disp: 90 tablet, Rfl:  2   vortioxetine  HBr (TRINTELLIX ) 20 MG TABS tablet, Take 1 tablet (20 mg total) by mouth daily., Disp: 30 tablet, Rfl: 0  Past Medical History: Past Medical History:  Diagnosis Date   Anemia    hx of in 1992    Anxiety    Arthritis    lower back    Back pain    Blockage of coronary artery of heart (HCC)    Constipation    Dysrhythmia    PVCs   Generalized muscle weakness    Hearing loss    Heart murmur    History of anxiety    History of hiatal hernia    Hyperlipemia    Hypertension    IBS (irritable bowel syndrome)    Joint pain    Lower urinary tract symptoms (LUTS)    Malignant neoplasm of prostate (HCC) 04/17/2014   Other fatigue    Pneumonia    hx x2 of pneumonia at age 40 and bronchitis x3   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   Shortness of breath on exertion    Ventral hernia without obstruction or gangrene 03/27/2016   Vitamin D  deficiency     Tobacco Use: Social History   Tobacco Use  Smoking Status Never  Smokeless Tobacco Never    Labs: Review Flowsheet  More data exists      Latest Ref Rng &  Units 06/24/2021 01/08/2022 10/19/2022 05/07/2023 05/11/2023  Labs for ITP Cardiac and Pulmonary Rehab  Cholestrol 100 - 199 mg/dL - 914  - - -  LDL (calc) 0 - 99 mg/dL - 48  - - -  HDL-C >78 mg/dL - 40  - - -  Trlycerides 0 - 149 mg/dL - 83  - - -  Hemoglobin A1c 4.8 - 5.6 % 5.6  - - 5.7  -  PH, Arterial 7.35 - 7.45 - - - - 7.330  7.314  7.316  7.348  7.391  7.366  7.406  7.362   PCO2 arterial 32 - 48 mmHg - - - - 44.8  46.3  45.7  43.3  40.6  43.0  39.2  44.0   Bicarbonate 20.0 - 28.0 mmol/L - - 26.9  22.8  - 23.6  23.5  23.3  23.8  24.6  24.7  24.6  24.1  25.0   TCO2 22 - 32 mmol/L - - 28  24  - 25  25  25  23  25  26  25  26  26  26  26  25  24  23  26    Acid-base deficit 0.0 - 2.0 mmol/L - - 2.0  - 2.0  3.0  3.0  2.0  1.0  1.0  1.0   O2 Saturation % - - 75  96  - 94  96  96  91  100  100  100  76  99     Details       Multiple values  from one day are sorted in reverse-chronological order         Capillary Blood Glucose: Lab Results  Component Value Date   GLUCAP 106 (H) 05/14/2023   GLUCAP 118 (H) 05/13/2023   GLUCAP 123 (H) 05/13/2023   GLUCAP 121 (H) 05/13/2023   GLUCAP 159 (H) 05/13/2023     Exercise Target Goals: Exercise Program Goal: Individual exercise prescription set using results from initial 6 min walk test and THRR while considering  patient's activity barriers and safety.   Exercise Prescription Goal: Initial exercise prescription builds to 30-45 minutes a day of aerobic activity, 2-3 days per week.  Home exercise guidelines will be given to patient during program as part of exercise prescription that the participant will acknowledge.  Activity Barriers & Risk Stratification:  Activity Barriers & Cardiac Risk Stratification - 06/23/23 1500       Activity Barriers & Cardiac Risk Stratification   Activity Barriers Incisional Pain;Back Problems    Cardiac Risk Stratification High   <5 METs on            6 Minute Walk:  6 Minute Walk     Row Name 06/23/23 1559         6 Minute Walk   Phase Initial     Distance 1510 feet     Walk Time 6 minutes     # of Rest Breaks 0     MPH 2.86     METS 3.31     RPE 11     Perceived Dyspnea  0     VO2 Peak 11.59     Symptoms Yes (comment)     Comments 3/10 chronic posterior hip pain- resolved once seated     Resting HR 62 bpm     Resting BP 136/82     Resting Oxygen Saturation  97 %     Exercise Oxygen Saturation  during 6  min walk 97 %     Max Ex. HR 113 bpm     Max Ex. BP 178/88     2 Minute Post BP 140/86              Oxygen Initial Assessment:   Oxygen Re-Evaluation:   Oxygen Discharge (Final Oxygen Re-Evaluation):   Initial Exercise Prescription:  Initial Exercise Prescription - 06/23/23 1600       Date of Initial Exercise RX and Referring Provider   Date 06/23/23    Referring Provider Carson Clara,  MD    Expected Discharge Date 09/15/23      Recumbant Bike   Level 2    RPM 50    Watts 30    Minutes 15    METs 2.5      NuStep   Level 1    SPM 70    Minutes 15    METs 2      Prescription Details   Frequency (times per week) 3    Duration Progress to 30 minutes of continuous aerobic without signs/symptoms of physical distress      Intensity   THRR 40-80% of Max Heartrate 61-122    Ratings of Perceived Exertion 11-13    Perceived Dyspnea 0-4      Progression   Progression Continue progressive overload as per policy without signs/symptoms or physical distress.      Resistance Training   Training Prescription Yes    Weight 3    Reps 10-15             Perform Capillary Blood Glucose checks as needed.  Exercise Prescription Changes:   Exercise Prescription Changes     Row Name 06/28/23 [phone]             Response to Exercise   Blood Pressure (Admit) 142/78       Blood Pressure (Exercise) 158/82       Blood Pressure (Exit) 132/78       Heart Rate (Admit) 52 bpm       Heart Rate (Exercise) 112 bpm       Heart Rate (Exit) 61 bpm       Rating of Perceived Exertion (Exercise) 11       Perceived Dyspnea (Exercise) 0       Symptoms none       Comments Pt first day in the [facility] ICR program       Duration Progress to 30 minutes of  aerobic without signs/symptoms of physical distress       Intensity THRR unchanged         Progression   Progression Continue to progress workloads to maintain intensity without signs/symptoms of physical distress.       Average METs 2.2         Resistance Training   Training Prescription Yes       Weight 3       Reps 10-15       Time 10 Minutes         Recumbant Bike   Level 2       RPM 74       Watts 23       Minutes 15       METs 2.3         NuStep   Level 1       SPM 91       Minutes 15       METs 2.1  Exercise Comments:   Exercise Comments     Row Name 06/28/23 [phone]            Exercise Comments Pt first day in the [facility] ICR program. Pt tolerated exercise well with an average MET level of 2.2. He is off to a great start and is learning his THRR, RPE and ExRx                Exercise Goals and Review:   Exercise Goals     Row Name 06/23/23 1501             Exercise Goals   Increase Physical Activity Yes       Intervention Provide advice, education, support and counseling about physical activity/exercise needs.;Develop an individualized exercise prescription for aerobic and resistive training based on initial evaluation findings, risk stratification, comorbidities and participant's personal goals.       Expected Outcomes Short Term: Attend rehab on a regular basis to increase amount of physical activity.;Long Term: Exercising regularly at least 3-5 days a week.;Long Term: Add in home exercise to make exercise part of routine and to increase amount of physical activity.       Increase Strength and Stamina Yes       Intervention Provide advice, education, support and counseling about physical activity/exercise needs.;Develop an individualized exercise prescription for aerobic and resistive training based on initial evaluation findings, risk stratification, comorbidities and participant's personal goals.       Expected Outcomes Short Term: Increase workloads from initial exercise prescription for resistance, speed, and METs.;Short Term: Perform resistance training exercises routinely during rehab and add in resistance training at home;Long Term: Improve cardiorespiratory fitness, muscular endurance and strength as measured by increased METs and functional capacity ( )       Able to understand and use rate of perceived exertion (RPE) scale Yes       Intervention Provide education and explanation on how to use RPE scale       Expected Outcomes Long Term:  Able to use RPE to guide intensity level when exercising independently;Short Term: Able to use RPE daily in  rehab to express subjective intensity level       Knowledge and understanding of Target Heart Rate Range (THRR) Yes       Intervention Provide education and explanation of THRR including how the numbers were predicted and where they are located for reference       Expected Outcomes Short Term: Able to state/look up THRR;Short Term: Able to use daily as guideline for intensity in rehab;Long Term: Able to use THRR to govern intensity when exercising independently       Understanding of Exercise Prescription Yes       Intervention Provide education, explanation, and written materials on patient's individual exercise prescription       Expected Outcomes Short Term: Able to explain program exercise prescription;Long Term: Able to explain home exercise prescription to exercise independently                Exercise Goals Re-Evaluation :  Exercise Goals Re-Evaluation     Row Name 06/28/23 0831             Exercise Goal Re-Evaluation   Exercise Goals Review Increase Physical Activity;Understanding of Exercise Prescription;Increase Strength and Stamina;Knowledge and understanding of Target Heart Rate Range (THRR);Able to understand and use rate of perceived exertion (RPE) scale       Comments Pt first day in the [facility] ICR program. Pt  tolerated exercise well with an average MET level of 2.2. He is off to a great start and is learning his THRR, RPE and ExRx       Expected Outcomes Will continue to monitor pt and progress workloads as tolerated without sign or symptom                Discharge Exercise Prescription (Final Exercise Prescription Changes):  Exercise Prescription Changes - 06/28/23 0828       Response to Exercise   Blood Pressure (Admit) 142/78    Blood Pressure (Exercise) 158/82    Blood Pressure (Exit) 132/78    Heart Rate (Admit) 52 bpm    Heart Rate (Exercise) 112 bpm    Heart Rate (Exit) 61 bpm    Rating of Perceived Exertion (Exercise) 11    Perceived Dyspnea  (Exercise) 0    Symptoms none    Comments Pt first day in the [facility] ICR program    Duration Progress to 30 minutes of  aerobic without signs/symptoms of physical distress    Intensity THRR unchanged      Progression   Progression Continue to progress workloads to maintain intensity without signs/symptoms of physical distress.    Average METs 2.2      Resistance Training   Training Prescription Yes    Weight 3    Reps 10-15    Time 10 Minutes      Recumbant Bike   Level 2    RPM 74    Watts 23    Minutes 15    METs 2.3      NuStep   Level 1    SPM 91    Minutes 15    METs 2.1             Nutrition:  Target Goals: Understanding of nutrition guidelines, daily intake of sodium 1500mg , cholesterol 200mg , calories 30% from fat and 7% or less from saturated fats, daily to have 5 or more servings of fruits and vegetables.  Biometrics:  Pre Biometrics - 06/23/23 1455       Pre Biometrics   Waist Circumference 52 inches    Hip Circumference 48 inches    Waist to Hip Ratio 1.08 %    Triceps Skinfold 25 mm    % Body Fat 38.9 %    Grip Strength 50 kg    Flexibility --   not done due to back pain   Single Leg Stand 23.81 seconds              Nutrition Therapy Plan and Nutrition Goals:  Nutrition Therapy & Goals - 06/28/23 1154       Nutrition Therapy   Diet Heart Healthy Diet    Drug/Food Interactions Statins/Certain Fruits      Personal Nutrition Goals   Nutrition Goal Patient to identify strategies for reducing cardiovascular risk by attending the [facility] education and nutrition series weekly.    Personal Goal #2 Patient to improve diet quality by using the plate method as a guide for meal planning to include lean protein/plant protein, fruits, vegetables, whole grains, nonfat dairy as part of a well-balanced diet.    Personal Goal #3 Patient to identify strategies for weight loss of 0.5-2.0# per week.    Comments Zyheir has medical history of HTN, CAD,  hyperlipidemia, s/p AVR, OSA. Caetano has started making many dietary changes to aid with heart health, weight loss. Lipids are well controlled on zetia , crestor . Patient will benefit from participation  in intensive cardiac rehab for nutrition, exercise, and lifestyle modification.      Intervention Plan   Intervention Prescribe, educate and counsel regarding individualized specific dietary modifications aiming towards targeted core components such as weight, hypertension, lipid management, diabetes, heart failure and other comorbidities.;Nutrition handout(s) given to patient.    Expected Outcomes Short Term Goal: Understand basic principles of dietary content, such as calories, fat, sodium, cholesterol and nutrients.;Long Term Goal: Adherence to prescribed nutrition plan.             Nutrition Assessments:  MEDIFICTS Score Key: >=70 Need to make dietary changes  40-70 Heart Healthy Diet <= 40 Therapeutic Level Cholesterol Diet    Picture Your Plate Scores: <40 Unhealthy dietary pattern with much room for improvement. 41-50 Dietary pattern unlikely to meet recommendations for good health and room for improvement. 51-60 More healthful dietary pattern, with some room for improvement.  >60 Healthy dietary pattern, although there may be some specific behaviors that could be improved.    Nutrition Goals Re-Evaluation:  Nutrition Goals Re-Evaluation     Row Name 06/28/23 1154             Goals   Current Weight 274 lb 11.1 oz (124.6 kg)       Comment A1c 5.7, Lipids from 2023 WNL, LDL 48       Expected Outcome Lui has medical history of HTN, CAD, hyperlipidemia, s/p AVR, OSA. Smayan has started making many dietary changes to aid with heart health, weight loss. Lipids are well controlled on zetia , crestor . Patient will benefit from participation in intensive cardiac rehab for nutrition, exercise, and lifestyle modification.                Nutrition Goals Re-Evaluation:  Nutrition  Goals Re-Evaluation     Row Name 06/28/23 1154             Goals   Current Weight 274 lb 11.1 oz (124.6 kg)       Comment A1c 5.7, Lipids from 2023 WNL, LDL 48       Expected Outcome Cyrus has medical history of HTN, CAD, hyperlipidemia, s/p AVR, OSA. Prestyn has started making many dietary changes to aid with heart health, weight loss. Lipids are well controlled on zetia , crestor . Patient will benefit from participation in intensive cardiac rehab for nutrition, exercise, and lifestyle modification.                Nutrition Goals Discharge (Final Nutrition Goals Re-Evaluation):  Nutrition Goals Re-Evaluation - 06/28/23 1154       Goals   Current Weight 274 lb 11.1 oz (124.6 kg)    Comment A1c 5.7, Lipids from 2023 WNL, LDL 48    Expected Outcome Kaled has medical history of HTN, CAD, hyperlipidemia, s/p AVR, OSA. Graden has started making many dietary changes to aid with heart health, weight loss. Lipids are well controlled on zetia , crestor . Patient will benefit from participation in intensive cardiac rehab for nutrition, exercise, and lifestyle modification.             Psychosocial: Target Goals: Acknowledge presence or absence of significant depression and/or stress, maximize coping skills, provide positive support system. Participant is able to verbalize types and ability to use techniques and skills needed for reducing stress and depression.  Initial Review & Psychosocial Screening:  Initial Psych Review & Screening - 06/23/23 1501       Initial Review   Current issues with Current Stress Concerns    Source  of Stress Concerns Family    Comments Ngai shared that he has some feelings of stress related to his wifes current illness. His wife has had 5 brain/skull surgeries in the past year and this has been very difficult for Crawford and his family. He has ways to relieve his stress and shared that they work well for him. Keyton denies any feelings of anxiety/depression. Support  offered, Jaylen denies any need for additional resources at this time.      Family Dynamics   Good Support System? Yes   Wife and sons for support     Barriers   Psychosocial barriers to participate in program The patient should benefit from training in stress management and relaxation.      Screening Interventions   Interventions Provide feedback about the scores to participant;To provide support and resources with identified psychosocial needs;Encouraged to exercise    Expected Outcomes Long Term Goal: Stressors or current issues are controlled or eliminated.;Short Term goal: Identification and review with participant of any Quality of Life or Depression concerns found by scoring the questionnaire.;Long Term goal: The participant improves quality of Life and PHQ9 Scores as seen by post scores and/or verbalization of changes             Quality of Life Scores:  Quality of Life - 06/23/23 1601       Quality of Life   Select Quality of Life      Quality of Life Scores   Health/Function Pre 28.1 %    Socioeconomic Pre 28.93 %    Psych/Spiritual Pre 28.21 %    Family Pre 27.6 %    GLOBAL Pre 28.18 %            Scores of 19 and below usually indicate a poorer quality of life in these areas.  A difference of  2-3 points is a clinically meaningful difference.  A difference of 2-3 points in the total score of the Quality of Life Index has been associated with significant improvement in overall quality of life, self-image, physical symptoms, and general health in studies assessing change in quality of life.  PHQ-9: Review Flowsheet       06/23/2023 06/10/2021  Depression screen PHQ 2/9  Decreased Interest 0 1  Down, Depressed, Hopeless 0 1  PHQ - 2 Score 0 2  Altered sleeping 0 2  Tired, decreased energy 0 2  Change in appetite 0 1  Feeling bad or failure about yourself  0 2  Trouble concentrating 0 1  Moving slowly or fidgety/restless 0 2  Suicidal thoughts 0 0  PHQ-9 Score  0 12  Difficult doing work/chores - Somewhat difficult   Interpretation of Total Score  Total Score Depression Severity:  1-4 = Minimal depression, 5-9 = Mild depression, 10-14 = Moderate depression, 15-19 = Moderately severe depression, 20-27 = Severe depression   Psychosocial Evaluation and Intervention:   Psychosocial Re-Evaluation:  Psychosocial Re-Evaluation     Row Name 07/01/23 1135             Psychosocial Re-Evaluation   Current issues with Current Stress Concerns       Comments Mainor has not voiced any increased concerns or stressors during exercise at cardiac rehab       Expected Outcomes Dyshon will have controlled or decreased stressors upon completion of cardiac rehab.       Interventions Stress management education;Encouraged to attend Cardiac Rehabilitation for the exercise;Relaxation education       Continue  Psychosocial Services  No Follow up required         Initial Review   Source of Stress Concerns Family       Comments will continue to monitor and offer support as needed.                Psychosocial Discharge (Final Psychosocial Re-Evaluation):  Psychosocial Re-Evaluation - 07/01/23 1135       Psychosocial Re-Evaluation   Current issues with Current Stress Concerns    Comments Michaell has not voiced any increased concerns or stressors during exercise at cardiac rehab    Expected Outcomes Caley will have controlled or decreased stressors upon completion of cardiac rehab.    Interventions Stress management education;Encouraged to attend Cardiac Rehabilitation for the exercise;Relaxation education    Continue Psychosocial Services  No Follow up required      Initial Review   Source of Stress Concerns Family    Comments will continue to monitor and offer support as needed.             Vocational Rehabilitation: Provide vocational rehab assistance to qualifying candidates.   Vocational Rehab Evaluation & Intervention:  Vocational Rehab - 06/23/23  1503       Initial Vocational Rehab Evaluation & Intervention   Assessment shows need for Vocational Rehabilitation No   Zaydyn is retired and works part time at [employer]: Education Goals: Education classes will be provided on a weekly basis, covering required topics. Participant will state understanding/return demonstration of topics presented.    Education     Row Name 06/28/23 0900     Education   Cardiac Education Topics [facility]   Select Workshops     Workshops   Educator Exercise Physiologist   Select Psychosocial   Psychosocial Workshop Healthy Sleep for a Healthy Heart   Instruction Review Code 1- Verbalizes Understanding   Class Start Time [phone]   Class Stop Time 0858   Class Time Calculation (min) 46 min    Row Name 06/30/23 0900     Education   Cardiac Education Topics [facility]   Secondary school teacher School   Educator Dietitian   Weekly Topic Simple Sides and Sauces   Instruction Review Code 1- Verbalizes Understanding   Class Start Time 0815   Class Stop Time 0855   Class Time Calculation (min) 40 min    Row Name 07/02/23 0900     Education   Cardiac Education Topics [facility]   Select Core Videos     Core Videos   Educator Exercise Physiologist   Select Psychosocial   Psychosocial How Our Thoughts Can Heal Our Hearts   Instruction Review Code 1- Verbalizes Understanding   Class Start Time [phone]   Class Stop Time 0848   Class Time Calculation (min) 36 min            Core Videos: Exercise    Move It!  Clinical staff conducted group or individual video education with verbal and written material and guidebook.  Patient learns the recommended [facility] exercise program. Exercise with the goal of living a long, healthy life. Some of the health benefits of exercise include controlled diabetes, healthier blood pressure levels, improved cholesterol levels, improved heart and lung capacity, improved sleep, and better  body composition. Everyone should speak with their doctor before starting or changing an exercise routine.  Biomechanical Limitations Clinical staff conducted group or individual video  education with verbal and written material and guidebook.  Patient learns how biomechanical limitations can impact exercise and how we can mitigate and possibly overcome limitations to have an impactful and balanced exercise routine.  Body Composition Clinical staff conducted group or individual video education with verbal and written material and guidebook.  Patient learns that body composition (ratio of muscle mass to fat mass) is a key component to assessing overall fitness, rather than body weight alone. Increased fat mass, especially visceral belly fat, can put us  at increased risk for metabolic syndrome, type 2 diabetes, heart disease, and even death. It is recommended to combine diet and exercise (cardiovascular and resistance training) to improve your body composition. Seek guidance from your physician and exercise physiologist before implementing an exercise routine.  Exercise Action Plan Clinical staff conducted group or individual video education with verbal and written material and guidebook.  Patient learns the recommended strategies to achieve and enjoy long-term exercise adherence, including variety, self-motivation, self-efficacy, and positive decision making. Benefits of exercise include fitness, good health, weight management, more energy, better sleep, less stress, and overall well-being.  Medical   Heart Disease Risk Reduction Clinical staff conducted group or individual video education with verbal and written material and guidebook.  Patient learns our heart is our most vital organ as it circulates oxygen, nutrients, white blood cells, and hormones throughout the entire body, and carries waste away. Data supports a plant-based eating plan like the Pritikin Program for its effectiveness in slowing  progression of and reversing heart disease. The video provides a number of recommendations to address heart disease.   Metabolic Syndrome and Belly Fat  Clinical staff conducted group or individual video education with verbal and written material and guidebook.  Patient learns what metabolic syndrome is, how it leads to heart disease, and how one can reverse it and keep it from coming back. You have metabolic syndrome if you have 3 of the following 5 criteria: abdominal obesity, high blood pressure, high triglycerides, low HDL cholesterol, and high blood sugar.  Hypertension and Heart Disease Clinical staff conducted group or individual video education with verbal and written material and guidebook.  Patient learns that high blood pressure, or hypertension, is very common in the [place] . Hypertension is largely due to excessive salt intake, but other important risk factors include being overweight, physical inactivity, drinking too much alcohol, smoking, and not eating enough potassium from fruits and vegetables. High blood pressure is a leading risk factor for heart attack, stroke, congestive heart failure, dementia, kidney failure, and premature death. Long-term effects of excessive salt intake include stiffening of the arteries and thickening of heart muscle and organ damage. Recommendations include ways to reduce hypertension and the risk of heart disease.  Diseases of Our Time - Focusing on Diabetes Clinical staff conducted group or individual video education with verbal and written material and guidebook.  Patient learns why the best way to stop diseases of our time is prevention, through food and other lifestyle changes. Medicine (such as prescription pills and surgeries) is often only a Band-Aid on the problem, not a long-term solution. Most common diseases of our time include obesity, type 2 diabetes, hypertension, heart disease, and cancer. The Pritikin Program is recommended and has been  proven to help reduce, reverse, and/or prevent the damaging effects of metabolic syndrome.  Nutrition   Overview of the Pritikin Eating Plan  Clinical staff conducted group or individual video education with verbal and written material and guidebook.  Patient learns  about the Pritikin Eating Plan for disease risk reduction. The Pritikin Eating Plan emphasizes a wide variety of unrefined, minimally-processed carbohydrates, like fruits, vegetables, whole grains, and legumes. Go, Caution, and Stop food choices are explained. Plant-based and lean animal proteins are emphasized. Rationale provided for low sodium intake for blood pressure control, low added sugars for blood sugar stabilization, and low added fats and oils for coronary artery disease risk reduction and weight management.  Calorie Density  Clinical staff conducted group or individual video education with verbal and written material and guidebook.  Patient learns about calorie density and how it impacts the Pritikin Eating Plan. Knowing the characteristics of the food you choose will help you decide whether those foods will lead to weight gain or weight loss, and whether you want to consume more or less of them. Weight loss is usually a side effect of the Pritikin Eating Plan because of its focus on low calorie-dense foods.  Label Reading  Clinical staff conducted group or individual video education with verbal and written material and guidebook.  Patient learns about the Pritikin recommended label reading guidelines and corresponding recommendations regarding calorie density, added sugars, sodium content, and whole grains.  Dining Out - Part 1  Clinical staff conducted group or individual video education with verbal and written material and guidebook.  Patient learns that restaurant meals can be sabotaging because they can be so high in calories, fat, sodium, and/or sugar. Patient learns recommended strategies on how to positively address  this and avoid unhealthy pitfalls.  Facts on Fats  Clinical staff conducted group or individual video education with verbal and written material and guidebook.  Patient learns that lifestyle modifications can be just as effective, if not more so, as many medications for lowering your risk of heart disease. A Pritikin lifestyle can help to reduce your risk of inflammation and atherosclerosis (cholesterol build-up, or plaque, in the artery walls). Lifestyle interventions such as dietary choices and physical activity address the cause of atherosclerosis. A review of the types of fats and their impact on blood cholesterol levels, along with dietary recommendations to reduce fat intake is also included.  Nutrition Action Plan  Clinical staff conducted group or individual video education with verbal and written material and guidebook.  Patient learns how to incorporate Pritikin recommendations into their lifestyle. Recommendations include planning and keeping personal health goals in mind as an important part of their success.  Healthy Mind-Set    Healthy Minds, Bodies, Hearts  Clinical staff conducted group or individual video education with verbal and written material and guidebook.  Patient learns how to identify when they are stressed. Video will discuss the impact of that stress, as well as the many benefits of stress management. Patient will also be introduced to stress management techniques. The way we think, act, and feel has an impact on our hearts.  How Our Thoughts Can Heal Our Hearts  Clinical staff conducted group or individual video education with verbal and written material and guidebook.  Patient learns that negative thoughts can cause depression and anxiety. This can result in negative lifestyle behavior and serious health problems. Cognitive behavioral therapy is an effective method to help control our thoughts in order to change and improve our emotional outlook.  Additional  Videos:  Exercise    Improving Performance  Clinical staff conducted group or individual video education with verbal and written material and guidebook.  Patient learns to use a non-linear approach by alternating intensity levels and lengths of time  spent exercising to help burn more calories and lose more body fat. Cardiovascular exercise helps improve heart health, metabolism, hormonal balance, blood sugar control, and recovery from fatigue. Resistance training improves strength, endurance, balance, coordination, reaction time, metabolism, and muscle mass. Flexibility exercise improves circulation, posture, and balance. Seek guidance from your physician and exercise physiologist before implementing an exercise routine and learn your capabilities and proper form for all exercise.  Introduction to Yoga  Clinical staff conducted group or individual video education with verbal and written material and guidebook.  Patient learns about yoga, a discipline of the coming together of mind, breath, and body. The benefits of yoga include improved flexibility, improved range of motion, better posture and core strength, increased lung function, weight loss, and positive self-image. Yoga's heart health benefits include lowered blood pressure, healthier heart rate, decreased cholesterol and triglyceride levels, improved immune function, and reduced stress. Seek guidance from your physician and exercise physiologist before implementing an exercise routine and learn your capabilities and proper form for all exercise.  Medical   Aging: Enhancing Your Quality of Life  Clinical staff conducted group or individual video education with verbal and written material and guidebook.  Patient learns key strategies and recommendations to stay in good physical health and enhance quality of life, such as prevention strategies, having an advocate, securing a Health Care Proxy and Power of Attorney, and keeping a list of medications  and system for tracking them. It also discusses how to avoid risk for bone loss.  Biology of Weight Control  Clinical staff conducted group or individual video education with verbal and written material and guidebook.  Patient learns that weight gain occurs because we consume more calories than we burn (eating more, moving less). Even if your body weight is normal, you may have higher ratios of fat compared to muscle mass. Too much body fat puts you at increased risk for cardiovascular disease, heart attack, stroke, type 2 diabetes, and obesity-related cancers. In addition to exercise, following the Pritikin Eating Plan can help reduce your risk.  Decoding Lab Results  Clinical staff conducted group or individual video education with verbal and written material and guidebook.  Patient learns that lab test reflects one measurement whose values change over time and are influenced by many factors, including medication, stress, sleep, exercise, food, hydration, pre-existing medical conditions, and more. It is recommended to use the knowledge from this video to become more involved with your lab results and evaluate your numbers to speak with your doctor.   Diseases of Our Time - Overview  Clinical staff conducted group or individual video education with verbal and written material and guidebook.  Patient learns that according to the CDC, 50% to 70% of chronic diseases (such as obesity, type 2 diabetes, elevated lipids, hypertension, and heart disease) are avoidable through lifestyle improvements including healthier food choices, listening to satiety cues, and increased physical activity.  Sleep Disorders Clinical staff conducted group or individual video education with verbal and written material and guidebook.  Patient learns how good quality and duration of sleep are important to overall health and well-being. Patient also learns about sleep disorders and how they impact health along with  recommendations to address them, including discussing with a physician.  Nutrition  Dining Out - Part 2 Clinical staff conducted group or individual video education with verbal and written material and guidebook.  Patient learns how to plan ahead and communicate in order to maximize their dining experience in a healthy and nutritious manner.  Included are recommended food choices based on the type of restaurant the patient is visiting.   Fueling a Banker conducted group or individual video education with verbal and written material and guidebook.  There is a strong connection between our food choices and our health. Diseases like obesity and type 2 diabetes are very prevalent and are in large-part due to lifestyle choices. The Pritikin Eating Plan provides plenty of food and hunger-curbing satisfaction. It is easy to follow, affordable, and helps reduce health risks.  Menu Workshop  Clinical staff conducted group or individual video education with verbal and written material and guidebook.  Patient learns that restaurant meals can sabotage health goals because they are often packed with calories, fat, sodium, and sugar. Recommendations include strategies to plan ahead and to communicate with the manager, chef, or server to help order a healthier meal.  Planning Your Eating Strategy  Clinical staff conducted group or individual video education with verbal and written material and guidebook.  Patient learns about the Pritikin Eating Plan and its benefit of reducing the risk of disease. The Pritikin Eating Plan does not focus on calories. Instead, it emphasizes high-quality, nutrient-rich foods. By knowing the characteristics of the foods, we choose, we can determine their calorie density and make informed decisions.  Targeting Your Nutrition Priorities  Clinical staff conducted group or individual video education with verbal and written material and guidebook.  Patient learns  that lifestyle habits have a tremendous impact on disease risk and progression. This video provides eating and physical activity recommendations based on your personal health goals, such as reducing LDL cholesterol, losing weight, preventing or controlling type 2 diabetes, and reducing high blood pressure.  Vitamins and Minerals  Clinical staff conducted group or individual video education with verbal and written material and guidebook.  Patient learns different ways to obtain key vitamins and minerals, including through a recommended healthy diet. It is important to discuss all supplements you take with your doctor.   Healthy Mind-Set    Smoking Cessation  Clinical staff conducted group or individual video education with verbal and written material and guidebook.  Patient learns that cigarette smoking and tobacco addiction pose a serious health risk which affects millions of people. Stopping smoking will significantly reduce the risk of heart disease, lung disease, and many forms of cancer. Recommended strategies for quitting are covered, including working with your doctor to develop a successful plan.  Culinary   Becoming a Set designer conducted group or individual video education with verbal and written material and guidebook.  Patient learns that cooking at home can be healthy, cost-effective, quick, and puts them in control. Keys to cooking healthy recipes will include looking at your recipe, assessing your equipment needs, planning ahead, making it simple, choosing cost-effective seasonal ingredients, and limiting the use of added fats, salts, and sugars.  Cooking - Breakfast and Snacks  Clinical staff conducted group or individual video education with verbal and written material and guidebook.  Patient learns how important breakfast is to satiety and nutrition through the entire day. Recommendations include key foods to eat during breakfast to help stabilize blood sugar  levels and to prevent overeating at meals later in the day. Planning ahead is also a key component.  Cooking - Educational psychologist conducted group or individual video education with verbal and written material and guidebook.  Patient learns eating strategies to improve overall health, including an approach to cook more at  home. Recommendations include thinking of animal protein as a side on your plate rather than center stage and focusing instead on lower calorie dense options like vegetables, fruits, whole grains, and plant-based proteins, such as beans. Making sauces in large quantities to freeze for later and leaving the skin on your vegetables are also recommended to maximize your experience.  Cooking - Healthy Salads and Dressing Clinical staff conducted group or individual video education with verbal and written material and guidebook.  Patient learns that vegetables, fruits, whole grains, and legumes are the foundations of the Pritikin Eating Plan. Recommendations include how to incorporate each of these in flavorful and healthy salads, and how to create homemade salad dressings. Proper handling of ingredients is also covered. Cooking - Soups and [employer] - Soups and Desserts Clinical staff conducted group or individual video education with verbal and written material and guidebook.  Patient learns that Pritikin soups and desserts make for easy, nutritious, and delicious snacks and meal components that are low in sodium, fat, sugar, and calorie density, while high in vitamins, minerals, and filling fiber. Recommendations include simple and healthy ideas for soups and desserts.   Overview     The Pritikin Solution Program Overview Clinical staff conducted group or individual video education with verbal and written material and guidebook.  Patient learns that the results of the Pritikin Program have been documented in more than 100 articles published in peer-reviewed  journals, and the benefits include reducing risk factors for (and, in some cases, even reversing) high cholesterol, high blood pressure, type 2 diabetes, obesity, and more! An overview of the three key pillars of the Pritikin Program will be covered: eating well, doing regular exercise, and having a healthy mind-set.  WORKSHOPS  Exercise: Exercise Basics: Building Your Action Plan Clinical staff led group instruction and group discussion with PowerPoint presentation and patient guidebook. To enhance the learning environment the use of posters, models and videos may be added. At the conclusion of this workshop, patients will comprehend the difference between physical activity and exercise, as well as the benefits of incorporating both, into their routine. Patients will understand the FITT (Frequency, Intensity, Time, and Type) principle and how to use it to build an exercise action plan. In addition, safety concerns and other considerations for exercise and cardiac rehab will be addressed by the presenter. The purpose of this lesson is to promote a comprehensive and effective weekly exercise routine in order to improve patients' overall level of fitness.   Managing Heart Disease: Your Path to a Healthier Heart Clinical staff led group instruction and group discussion with PowerPoint presentation and patient guidebook. To enhance the learning environment the use of posters, models and videos may be added.At the conclusion of this workshop, patients will understand the anatomy and physiology of the heart. Additionally, they will understand how Pritikin's three pillars impact the risk factors, the progression, and the management of heart disease.  The purpose of this lesson is to provide a high-level overview of the heart, heart disease, and how the Pritikin lifestyle positively impacts risk factors.  Exercise Biomechanics Clinical staff led group instruction and group discussion with PowerPoint  presentation and patient guidebook. To enhance the learning environment the use of posters, models and videos may be added. Patients will learn how the structural parts of their bodies function and how these functions impact their daily activities, movement, and exercise. Patients will learn how to promote a neutral spine, learn how to manage pain, and identify ways  to improve their physical movement in order to promote healthy living. The purpose of this lesson is to expose patients to common physical limitations that impact physical activity. Participants will learn practical ways to adapt and manage aches and pains, and to minimize their effect on regular exercise. Patients will learn how to maintain good posture while sitting, walking, and lifting.  Balance Training and Fall Prevention  Clinical staff led group instruction and group discussion with PowerPoint presentation and patient guidebook. To enhance the learning environment the use of posters, models and videos may be added. At the conclusion of this workshop, patients will understand the importance of their sensorimotor skills (vision, proprioception, and the vestibular system) in maintaining their ability to balance as they age. Patients will apply a variety of balancing exercises that are appropriate for their current level of function. Patients will understand the common causes for poor balance, possible solutions to these problems, and ways to modify their physical environment in order to minimize their fall risk. The purpose of this lesson is to teach patients about the importance of maintaining balance as they age and ways to minimize their risk of falling.  WORKSHOPS   Nutrition:  Fueling a Ship broker led group instruction and group discussion with PowerPoint presentation and patient guidebook. To enhance the learning environment the use of posters, models and videos may be added. Patients will review the  foundational principles of the Pritikin Eating Plan and understand what constitutes a serving size in each of the food groups. Patients will also learn Pritikin-friendly foods that are better choices when away from home and review make-ahead meal and snack options. Calorie density will be reviewed and applied to three nutrition priorities: weight maintenance, weight loss, and weight gain. The purpose of this lesson is to reinforce (in a group setting) the key concepts around what patients are recommended to eat and how to apply these guidelines when away from home by planning and selecting Pritikin-friendly options. Patients will understand how calorie density may be adjusted for different weight management goals.  Mindful Eating  Clinical staff led group instruction and group discussion with PowerPoint presentation and patient guidebook. To enhance the learning environment the use of posters, models and videos may be added. Patients will briefly review the concepts of the Pritikin Eating Plan and the importance of low-calorie dense foods. The concept of mindful eating will be introduced as well as the importance of paying attention to internal hunger signals. Triggers for non-hunger eating and techniques for dealing with triggers will be explored. The purpose of this lesson is to provide patients with the opportunity to review the basic principles of the Pritikin Eating Plan, discuss the value of eating mindfully and how to measure internal cues of hunger and fullness using the Hunger Scale. Patients will also discuss reasons for non-hunger eating and learn strategies to use for controlling emotional eating.  Targeting Your Nutrition Priorities Clinical staff led group instruction and group discussion with PowerPoint presentation and patient guidebook. To enhance the learning environment the use of posters, models and videos may be added. Patients will learn how to determine their genetic susceptibility to  disease by reviewing their family history. Patients will gain insight into the importance of diet as part of an overall healthy lifestyle in mitigating the impact of genetics and other environmental insults. The purpose of this lesson is to provide patients with the opportunity to assess their personal nutrition priorities by looking at their family history, their own health  history and current risk factors. Patients will also be able to discuss ways of prioritizing and modifying the Pritikin Eating Plan for their highest risk areas  Menu  Clinical staff led group instruction and group discussion with PowerPoint presentation and patient guidebook. To enhance the learning environment the use of posters, models and videos may be added. Using menus brought in from [employer], or printed from [employer], patients will apply the Pritikin dining out guidelines that were presented in the [employer] video. Patients will also be able to practice these guidelines in a variety of provided scenarios. The purpose of this lesson is to provide patients with the opportunity to practice hands-on learning of the Pritikin Dining Out guidelines with actual menus and practice scenarios.  Label Reading Clinical staff led group instruction and group discussion with PowerPoint presentation and patient guidebook. To enhance the learning environment the use of posters, models and videos may be added. Patients will review and discuss the Pritikin label reading guidelines presented in Pritikin's Label Reading Educational series video. Using fool labels brought in from local grocery stores and markets, patients will apply the label reading guidelines and determine if the packaged food meet the Pritikin guidelines. The purpose of this lesson is to provide patients with the opportunity to review, discuss, and practice hands-on learning of the Pritikin Label Reading guidelines with actual packaged food  labels. Cooking School  Pritikin's [employer] are designed to teach patients ways to prepare quick, simple, and affordable recipes at home. The importance of nutrition's role in chronic disease risk reduction is reflected in its emphasis in the overall Pritikin program. By learning how to prepare essential core Pritikin Eating Plan recipes, patients will increase control over what they eat; be able to customize the flavor of foods without the use of added salt, sugar, or fat; and improve the quality of the food they consume. By learning a set of core recipes which are easily assembled, quickly prepared, and affordable, patients are more likely to prepare more healthy foods at home. These workshops focus on convenient breakfasts, simple entres, side dishes, and desserts which can be prepared with minimal effort and are consistent with nutrition recommendations for cardiovascular risk reduction. Cooking [employer] are taught by a Armed forces logistics/support/administrative officer (RD) who has been trained by the [employer]. The chef or RD has a clear understanding of the importance of minimizing - if not completely eliminating - added fat, sugar, and sodium in recipes. Throughout the series of Cooking School Workshop sessions, patients will learn about healthy ingredients and efficient methods of cooking to build confidence in their capability to prepare    Cooking School weekly topics:  Adding Flavor- Sodium-Free  Fast and Healthy Breakfasts  Powerhouse Plant-Based Proteins  Satisfying Salads and Dressings  Simple Sides and Sauces  International Cuisine-Spotlight on the [employer] Zones  Delicious Desserts  Savory Soups  [employer] - Meals in a Astronomer Appetizers and Snacks  Comforting Weekend Breakfasts  One-Pot Wonders   Fast Evening Meals  Landscape architect Your Pritikin Plate  WORKSHOPS   Healthy Mindset (Psychosocial):  Focused Goals, Sustainable  Changes Clinical staff led group instruction and group discussion with PowerPoint presentation and patient guidebook. To enhance the learning environment the use of posters, models and videos may be added. Patients will be able to apply effective goal setting strategies to establish at least one personal goal, and then take consistent, meaningful action toward that  goal. They will learn to identify common barriers to achieving personal goals and develop strategies to overcome them. Patients will also gain an understanding of how our mind-set can impact our ability to achieve goals and the importance of cultivating a positive and growth-oriented mind-set. The purpose of this lesson is to provide patients with a deeper understanding of how to set and achieve personal goals, as well as the tools and strategies needed to overcome common obstacles which may arise along the way.  From Head to Heart: The Power of a Healthy Outlook  Clinical staff led group instruction and group discussion with PowerPoint presentation and patient guidebook. To enhance the learning environment the use of posters, models and videos may be added. Patients will be able to recognize and describe the impact of emotions and mood on physical health. They will discover the importance of self-care and explore self-care practices which may work for them. Patients will also learn how to utilize the 4 C's to cultivate a healthier outlook and better manage stress and challenges. The purpose of this lesson is to demonstrate to patients how a healthy outlook is an essential part of maintaining good health, especially as they continue their cardiac rehab journey.  Healthy Sleep for a Healthy Heart Clinical staff led group instruction and group discussion with PowerPoint presentation and patient guidebook. To enhance the learning environment the use of posters, models and videos may be added. At the conclusion of this workshop, patients will be able  to demonstrate knowledge of the importance of sleep to overall health, well-being, and quality of life. They will understand the symptoms of, and treatments for, common sleep disorders. Patients will also be able to identify daytime and nighttime behaviors which impact sleep, and they will be able to apply these tools to help manage sleep-related challenges. The purpose of this lesson is to provide patients with a general overview of sleep and outline the importance of quality sleep. Patients will learn about a few of the most common sleep disorders. Patients will also be introduced to the concept of "sleep hygiene," and discover ways to self-manage certain sleeping problems through simple daily behavior changes. Finally, the workshop will motivate patients by clarifying the links between quality sleep and their goals of heart-healthy living.   Recognizing and Reducing Stress Clinical staff led group instruction and group discussion with PowerPoint presentation and patient guidebook. To enhance the learning environment the use of posters, models and videos may be added. At the conclusion of this workshop, patients will be able to understand the types of stress reactions, differentiate between acute and chronic stress, and recognize the impact that chronic stress has on their health. They will also be able to apply different coping mechanisms, such as reframing negative self-talk. Patients will have the opportunity to practice a variety of stress management techniques, such as deep abdominal breathing, progressive muscle relaxation, and/or guided imagery.  The purpose of this lesson is to educate patients on the role of stress in their lives and to provide healthy techniques for coping with it.  Learning Barriers/Preferences:  Learning Barriers/Preferences - 06/23/23 1503       Learning Barriers/Preferences   Learning Barriers Sight   glasses   Learning Preferences Audio;Computer/Internet;Group  Instruction;Individual Instruction;Skilled Demonstration;Verbal Instruction;Written Material;Video;Pictoral             Education Topics:  Knowledge Questionnaire Score:  Knowledge Questionnaire Score - 06/23/23 1503       Knowledge Questionnaire Score   Pre Score 22/24  Core Components/Risk Factors/Patient Goals at Admission:  Personal Goals and Risk Factors at Admission - 06/23/23 1503       Core Components/Risk Factors/Patient Goals on Admission    Weight Management Yes;Obesity;Weight Loss    Intervention Weight Management: Develop a combined nutrition and exercise program designed to reach desired caloric intake, while maintaining appropriate intake of nutrient and fiber, sodium and fats, and appropriate energy expenditure required for the weight goal.;Weight Management: Provide education and appropriate resources to help participant work on and attain dietary goals.;Weight Management/Obesity: Establish reasonable short term and long term weight goals.;Obesity: Provide education and appropriate resources to help participant work on and attain dietary goals.    Admit Weight 274 lb 14.6 oz (124.7 kg)    Goal Weight: Long Term 250 lb (113.4 kg)   pt goal   Expected Outcomes Short Term: Continue to assess and modify interventions until short term weight is achieved;Long Term: Adherence to nutrition and physical activity/exercise program aimed toward attainment of established weight goal;Weight Loss: Understanding of general recommendations for a balanced deficit meal plan, which promotes 1-2 lb weight loss per week and includes a negative energy balance of [id number] kcal/d;Understanding recommendations for meals to include 15-35% energy as protein, 25-35% energy from fat, 35-60% energy from carbohydrates, less than 200mg  of dietary cholesterol, 20-35 gm of total fiber daily;Understanding of distribution of calorie intake throughout the day with the consumption of 4-5  meals/snacks    Hypertension Yes    Intervention Provide education on lifestyle modifcations including regular physical activity/exercise, weight management, moderate sodium restriction and increased consumption of fresh fruit, vegetables, and low fat dairy, alcohol moderation, and smoking cessation.;Monitor prescription use compliance.    Expected Outcomes Short Term: Continued assessment and intervention until BP is < 140/37mm HG in hypertensive participants. < 130/19mm HG in hypertensive participants with diabetes, heart failure or chronic kidney disease.;Long Term: Maintenance of blood pressure at goal levels.    Lipids Yes    Intervention Provide education and support for participant on nutrition & aerobic/resistive exercise along with prescribed medications to achieve LDL 70mg , HDL >40mg .    Expected Outcomes Long Term: Cholesterol controlled with medications as prescribed, with individualized exercise RX and with personalized nutrition plan. Value goals: LDL < 70mg , HDL > 40 mg.;Short Term: Participant states understanding of desired cholesterol values and is compliant with medications prescribed. Participant is following exercise prescription and nutrition guidelines.    Stress Yes    Intervention Offer individual and/or small group education and counseling on adjustment to heart disease, stress management and health-related lifestyle change. Teach and support self-help strategies.;Refer participants experiencing significant psychosocial distress to appropriate mental health specialists for further evaluation and treatment. When possible, include family members and significant others in education/counseling sessions.    Expected Outcomes Long Term: Emotional wellbeing is indicated by absence of clinically significant psychosocial distress or social isolation.;Short Term: Participant demonstrates changes in health-related behavior, relaxation and other stress management skills, ability to obtain  effective social support, and compliance with psychotropic medications if prescribed.             Core Components/Risk Factors/Patient Goals Review:   Goals and Risk Factor Review     Row Name 07/01/23 1136             Core Components/Risk Factors/Patient Goals Review   Personal Goals Review Weight Management/Obesity;Hypertension;Lipids;Stress       Review Maxamilian started cardiac rehab on 06/28/23. Tharun is off to a good start to exercise. Vital signs have  been stable.       Expected Outcomes Dieter will continue to participate in cardiac rehab for exercise, nutrtion and lifestyle modifications                Core Components/Risk Factors/Patient Goals at Discharge (Final Review):   Goals and Risk Factor Review - 07/01/23 1136       Core Components/Risk Factors/Patient Goals Review   Personal Goals Review Weight Management/Obesity;Hypertension;Lipids;Stress    Review Odarius started cardiac rehab on 06/28/23. Cinch is off to a good start to exercise. Vital signs have been stable.    Expected Outcomes Tynan will continue to participate in cardiac rehab for exercise, nutrtion and lifestyle modifications             ITP Comments:  ITP Comments     Row Name 06/23/23 1455 07/01/23 1133         ITP Comments Dr. Gaylyn Keas medical director. Introduction to pritikin education/intensive cardiac rehab. Initial orientation packet reviewed with patient. 30 Day ITP Review. Lajarvis started cardiac rehab on 06/30/23. Nikoloz is off to a good start to exercise.               Comments: See ITP comment

## 2023-07-07 ENCOUNTER — Encounter (HOSPITAL_COMMUNITY)
Admission: RE | Admit: 2023-07-07 | Discharge: 2023-07-07 | Disposition: A | Discharge status: Home / Self Care | Source: Ambulatory Visit | Attending: Cardiology | Admitting: Cardiology

## 2023-07-07 DIAGNOSIS — Z952 Presence of prosthetic heart valve: Secondary | ICD-10-CM | POA: Diagnosis not present

## 2023-07-09 ENCOUNTER — Encounter (HOSPITAL_COMMUNITY): Admission: RE | Admit: 2023-07-09 | Source: Ambulatory Visit

## 2023-07-12 ENCOUNTER — Encounter (HOSPITAL_COMMUNITY)
Admission: RE | Admit: 2023-07-12 | Discharge: 2023-07-12 | Disposition: A | Discharge status: Home / Self Care | Source: Ambulatory Visit | Attending: Cardiology | Admitting: Cardiology

## 2023-07-12 DIAGNOSIS — Z952 Presence of prosthetic heart valve: Secondary | ICD-10-CM | POA: Diagnosis not present

## 2023-07-14 ENCOUNTER — Encounter (HOSPITAL_COMMUNITY)
Admission: RE | Admit: 2023-07-14 | Discharge: 2023-07-14 | Disposition: A | Discharge status: Home / Self Care | Source: Ambulatory Visit | Attending: Cardiology | Admitting: Cardiology

## 2023-07-14 DIAGNOSIS — Z952 Presence of prosthetic heart valve: Secondary | ICD-10-CM

## 2023-07-16 ENCOUNTER — Encounter (HOSPITAL_COMMUNITY)
Admission: RE | Admit: 2023-07-16 | Discharge: 2023-07-16 | Disposition: A | Discharge status: Home / Self Care | Source: Ambulatory Visit | Attending: Cardiology | Admitting: Cardiology

## 2023-07-16 DIAGNOSIS — Z952 Presence of prosthetic heart valve: Secondary | ICD-10-CM | POA: Insufficient documentation

## 2023-07-19 ENCOUNTER — Encounter (HOSPITAL_COMMUNITY)
Admission: RE | Admit: 2023-07-19 | Discharge: 2023-07-19 | Disposition: A | Discharge status: Home / Self Care | Source: Ambulatory Visit | Attending: Cardiology

## 2023-07-19 DIAGNOSIS — Z952 Presence of prosthetic heart valve: Secondary | ICD-10-CM | POA: Diagnosis not present

## 2023-07-21 ENCOUNTER — Encounter (HOSPITAL_COMMUNITY)
Admission: RE | Admit: 2023-07-21 | Discharge: 2023-07-21 | Disposition: A | Discharge status: Home / Self Care | Source: Ambulatory Visit | Attending: Cardiology | Admitting: Cardiology

## 2023-07-21 DIAGNOSIS — Z952 Presence of prosthetic heart valve: Secondary | ICD-10-CM

## 2023-07-23 ENCOUNTER — Encounter (HOSPITAL_COMMUNITY)

## 2023-07-23 ENCOUNTER — Encounter (HOSPITAL_COMMUNITY)
Admission: RE | Admit: 2023-07-23 | Discharge: 2023-07-23 | Disposition: A | Discharge status: Home / Self Care | Source: Ambulatory Visit | Attending: Cardiology | Admitting: Cardiology

## 2023-07-23 DIAGNOSIS — Z952 Presence of prosthetic heart valve: Secondary | ICD-10-CM

## 2023-07-23 NOTE — Progress Notes (Signed)
 Ayon switched to 815 due to work schedule.Monte Antonio RN BSN

## 2023-07-26 ENCOUNTER — Encounter (HOSPITAL_COMMUNITY)
Admission: RE | Admit: 2023-07-26 | Discharge: 2023-07-26 | Disposition: A | Discharge status: Home / Self Care | Source: Ambulatory Visit | Attending: Cardiology | Admitting: Cardiology

## 2023-07-26 ENCOUNTER — Encounter (HOSPITAL_COMMUNITY)

## 2023-07-26 DIAGNOSIS — Z952 Presence of prosthetic heart valve: Secondary | ICD-10-CM

## 2023-07-28 ENCOUNTER — Telehealth (HOSPITAL_COMMUNITY): Payer: Self-pay

## 2023-07-28 ENCOUNTER — Encounter (HOSPITAL_COMMUNITY)
Admission: RE | Admit: 2023-07-28 | Discharge: 2023-07-28 | Disposition: A | Discharge status: Home / Self Care | Source: Ambulatory Visit | Attending: Cardiology | Admitting: Cardiology

## 2023-07-28 ENCOUNTER — Encounter (HOSPITAL_COMMUNITY)

## 2023-07-28 NOTE — Telephone Encounter (Signed)
 Patient left message calling out for 8:15 class today and Friday, did not give a reason.

## 2023-07-30 ENCOUNTER — Encounter (HOSPITAL_COMMUNITY)

## 2023-08-02 ENCOUNTER — Encounter (HOSPITAL_COMMUNITY): Admission: RE | Admit: 2023-08-02 | Source: Ambulatory Visit | Attending: Cardiology | Admitting: Cardiology

## 2023-08-02 ENCOUNTER — Encounter (HOSPITAL_COMMUNITY)

## 2023-08-02 NOTE — Progress Notes (Incomplete)
 Discharge Progress Report  Patient Details  Name: Thomas [name] MRN: [id number] Date of Birth: December 23, 1955 Referring Provider:   Flowsheet Row INTENSIVE CARDIAC REHAB ORIENT from 06/23/2023 in [facility]  Referring Provider Carson Clara, MD        Number of Visits: 21  Reason for Discharge:  Early Exit:  medical reasons  Smoking History:  Social History   Tobacco Use  Smoking Status Never  Smokeless Tobacco Never    Diagnosis:  05/11/23 S/P AVR (aortic valve replacement)  ADL [facility]:   Initial Exercise Prescription:  Initial Exercise Prescription - 06/23/23 1600       Date of Initial Exercise RX and Referring Provider   Date 06/23/23    Referring Provider Carson Clara, MD    Expected Discharge Date 09/15/23      Recumbant Bike   Level 2    RPM 50    Watts 30    Minutes 15    METs 2.5      NuStep   Level 1    SPM 70    Minutes 15    METs 2      Prescription Details   Frequency (times per week) 3    Duration Progress to 30 minutes of continuous aerobic without signs/symptoms of physical distress      Intensity   THRR 40-80% of Max Heartrate 61-122    Ratings of Perceived Exertion 11-13    Perceived Dyspnea 0-4      Progression   Progression Continue progressive overload as per policy without signs/symptoms or physical distress.      Resistance Training   Training Prescription Yes    Weight 3    Reps 10-15             Discharge Exercise Prescription (Final Exercise Prescription Changes):  Exercise Prescription Changes - 07/14/23 0830       Response to Exercise   Blood Pressure (Admit) 140/84    Blood Pressure (Exercise) 150/82    Blood Pressure (Exit) 138/72    Heart Rate (Admit) 57 bpm    Heart Rate (Exercise) 105 bpm    Heart Rate (Exit) 66 bpm    Rating of Perceived Exertion (Exercise) 11.5    Perceived Dyspnea (Exercise) 0    Symptoms none    Comments  Reviewed MET's, goals and home ExRx    Duration Progress to 30 minutes of  aerobic without signs/symptoms of physical distress    Intensity THRR unchanged      Progression   Progression Continue to progress workloads to maintain intensity without signs/symptoms of physical distress.    Average METs 2.6      Resistance Training   Training Prescription No    Weight 3    Reps 10-15    Time 10 Minutes      Recumbant Bike   Level 2    RPM 89    Watts 37    Minutes 15    METs 2.6      NuStep   Level 1    SPM 127   Taken last session. did not report today   Minutes 15    METs 2.3      Home Exercise Plan   Plans to continue exercise at Home (comment)    Frequency Add 2 additional days to program exercise sessions.    Initial Home Exercises Provided 07/14/23  Functional Capacity:  6 Minute Walk     Row Name 06/23/23 1559         6 Minute Walk   Phase Initial     Distance 1510 feet     Walk Time 6 minutes     # of Rest Breaks 0     MPH 2.86     METS 3.31     RPE 11     Perceived Dyspnea  0     VO2 Peak 11.59     Symptoms Yes (comment)     Comments 3/10 chronic posterior hip pain- resolved once seated     Resting HR 62 bpm     Resting BP 136/82     Resting Oxygen Saturation  97 %     Exercise Oxygen Saturation  during 6 min walk 97 %     Max Ex. HR 113 bpm     Max Ex. BP 178/88     2 Minute Post BP 140/86              Psychological, QOL, Others - Outcomes: PHQ 2/9:    06/23/2023    3:00 PM 06/10/2021   10:41 AM  Depression screen PHQ 2/9  Decreased Interest 0 1  Down, Depressed, Hopeless 0 1  PHQ - 2 Score 0 2  Altered sleeping 0 2  Tired, decreased energy 0 2  Change in appetite 0 1  Feeling bad or failure about yourself  0 2  Trouble concentrating 0 1  Moving slowly or fidgety/restless 0 2  Suicidal thoughts 0 0  PHQ-9 Score 0 12  Difficult doing work/chores  Somewhat difficult    Quality of Life:  Quality of Life - 06/23/23  1601       Quality of Life   Select Quality of Life      Quality of Life Scores   Health/Function Pre 28.1 %    Socioeconomic Pre 28.93 %    Psych/Spiritual Pre 28.21 %    Family Pre 27.6 %    GLOBAL Pre 28.18 %             Personal Goals: Goals established at orientation with interventions provided to work toward goal.  Personal Goals and Risk Factors at Admission - 06/23/23 1503       Core Components/Risk Factors/Patient Goals on Admission    Weight Management Yes;Obesity;Weight Loss    Intervention Weight Management: Develop a combined nutrition and exercise program designed to reach desired caloric intake, while maintaining appropriate intake of nutrient and fiber, sodium and fats, and appropriate energy expenditure required for the weight goal.;Weight Management: Provide education and appropriate resources to help participant work on and attain dietary goals.;Weight Management/Obesity: Establish reasonable short term and long term weight goals.;Obesity: Provide education and appropriate resources to help participant work on and attain dietary goals.    Admit Weight 274 lb 14.6 oz (124.7 kg)    Goal Weight: Long Term 250 lb (113.4 kg)   pt goal   Expected Outcomes Short Term: Continue to assess and modify interventions until short term weight is achieved;Long Term: Adherence to nutrition and physical activity/exercise program aimed toward attainment of established weight goal;Weight Loss: Understanding of general recommendations for a balanced deficit meal plan, which promotes 1-2 lb weight loss per week and includes a negative energy balance of [phone] kcal/d;Understanding recommendations for meals to include 15-35% energy as protein, 25-35% energy from fat, 35-60% energy from carbohydrates, less than 200mg  of dietary cholesterol, 20-35  gm of total fiber daily;Understanding of distribution of calorie intake throughout the day with the consumption of 4-5 meals/snacks     Hypertension Yes    Intervention Provide education on lifestyle modifcations including regular physical activity/exercise, weight management, moderate sodium restriction and increased consumption of fresh fruit, vegetables, and low fat dairy, alcohol moderation, and smoking cessation.;Monitor prescription use compliance.    Expected Outcomes Short Term: Continued assessment and intervention until BP is < 140/59mm HG in hypertensive participants. < 130/16mm HG in hypertensive participants with diabetes, heart failure or chronic kidney disease.;Long Term: Maintenance of blood pressure at goal levels.    Lipids Yes    Intervention Provide education and support for participant on nutrition & aerobic/resistive exercise along with prescribed medications to achieve LDL 70mg , HDL >40mg .    Expected Outcomes Long Term: Cholesterol controlled with medications as prescribed, with individualized exercise RX and with personalized nutrition plan. Value goals: LDL < 70mg , HDL > 40 mg.;Short Term: Participant states understanding of desired cholesterol values and is compliant with medications prescribed. Participant is following exercise prescription and nutrition guidelines.    Stress Yes    Intervention Offer individual and/or small group education and counseling on adjustment to heart disease, stress management and health-related lifestyle change. Teach and support self-help strategies.;Refer participants experiencing significant psychosocial distress to appropriate mental health specialists for further evaluation and treatment. When possible, include family members and significant others in education/counseling sessions.    Expected Outcomes Long Term: Emotional wellbeing is indicated by absence of clinically significant psychosocial distress or social isolation.;Short Term: Participant demonstrates changes in health-related behavior, relaxation and other stress management skills, ability to obtain effective social  support, and compliance with psychotropic medications if prescribed.              Personal Goals Discharge:  Goals and Risk Factor Review     Row Name 07/01/23 1136 08/10/23 1016           Core Components/Risk Factors/Patient Goals Review   Personal Goals Review Weight Management/Obesity;Hypertension;Lipids;Stress Weight Management/Obesity;Hypertension;Lipids;Stress      Review Giordano started cardiac rehab on 06/28/23. Jathan is off to a good start to exercise. Vital signs have been stable. Timothy stopped cardiac rehab on 07/26/23 due to back problems      Expected Outcomes Taino will continue to participate in cardiac rehab for exercise, nutrtion and lifestyle modifications Zakai will continue to participate in cardiac rehab for exercise, nutrtion and lifestyle modifications               Exercise Goals and Review:  Exercise Goals     Row Name 06/23/23 1501             Exercise Goals   Increase Physical Activity Yes       Intervention Provide advice, education, support and counseling about physical activity/exercise needs.;Develop an individualized exercise prescription for aerobic and resistive training based on initial evaluation findings, risk stratification, comorbidities and participant's personal goals.       Expected Outcomes Short Term: Attend rehab on a regular basis to increase amount of physical activity.;Long Term: Exercising regularly at least 3-5 days a week.;Long Term: Add in home exercise to make exercise part of routine and to increase amount of physical activity.       Increase Strength and Stamina Yes       Intervention Provide advice, education, support and counseling about physical activity/exercise needs.;Develop an individualized exercise prescription for aerobic and resistive training based on initial evaluation findings, risk  stratification, comorbidities and participant's personal goals.       Expected Outcomes Short Term: Increase workloads from initial  exercise prescription for resistance, speed, and METs.;Short Term: Perform resistance training exercises routinely during rehab and add in resistance training at home;Long Term: Improve cardiorespiratory fitness, muscular endurance and strength as measured by increased METs and functional capacity ( )       Able to understand and use rate of perceived exertion (RPE) scale Yes       Intervention Provide education and explanation on how to use RPE scale       Expected Outcomes Long Term:  Able to use RPE to guide intensity level when exercising independently;Short Term: Able to use RPE daily in rehab to express subjective intensity level       Knowledge and understanding of Target Heart Rate Range (THRR) Yes       Intervention Provide education and explanation of THRR including how the numbers were predicted and where they are located for reference       Expected Outcomes Short Term: Able to state/look up THRR;Short Term: Able to use daily as guideline for intensity in rehab;Long Term: Able to use THRR to govern intensity when exercising independently       Understanding of Exercise Prescription Yes       Intervention Provide education, explanation, and written materials on patient's individual exercise prescription       Expected Outcomes Short Term: Able to explain program exercise prescription;Long Term: Able to explain home exercise prescription to exercise independently                Exercise Goals Re-Evaluation:  Exercise Goals Re-Evaluation     Row Name 06/28/23 0831 07/14/23 0830           Exercise Goal Re-Evaluation   Exercise Goals Review Increase Physical Activity;Understanding of Exercise Prescription;Increase Strength and Stamina;Knowledge and understanding of Target Heart Rate Range (THRR);Able to understand and use rate of perceived exertion (RPE) scale Increase Physical Activity;Understanding of Exercise Prescription;Increase Strength and Stamina;Knowledge and  understanding of Target Heart Rate Range (THRR);Able to understand and use rate of perceived exertion (RPE) scale      Comments Pt first day in the [facility] program. Pt tolerated exercise well with an average MET level of 2.2. He is off to a great start and is learning his THRR, RPE and ExRx Reviewed MET's, goals and home ExRx. Pt tolerated exercise well with an average MET level of 2.45. Pt will conitnue to exercise on his own by doing his PT stretches and walking 30-45 mins 1-2 days a week. He is feeling good about his goals and is getting back to doing handiwork and is increasing strength and stamina      Expected Outcomes Will continue to monitor pt and progress workloads as tolerated without sign or symptom Will continue to monitor pt and progress workloads as tolerated without sign or symptom               Nutrition & Weight - Outcomes:  Pre Biometrics - 06/23/23 1455       Pre Biometrics   Waist Circumference 52 inches    Hip Circumference 48 inches    Waist to Hip Ratio 1.08 %    Triceps Skinfold 25 mm    % Body Fat 38.9 %    Grip Strength 50 kg    Flexibility --   not done due to back pain   Single Leg Stand 23.81 seconds  Nutrition:  Nutrition Therapy & Goals - 08/02/23 1055       Nutrition Therapy   Diet Heart Healthy Diet    Drug/Food Interactions Statins/Certain Fruits      Personal Nutrition Goals   Nutrition Goal Patient to identify strategies for reducing cardiovascular risk by attending the [facility] education and nutrition series weekly.   goal in progress.   Personal Goal #2 Patient to improve diet quality by using the plate method as a guide for meal planning to include lean protein/plant protein, fruits, vegetables, whole grains, nonfat dairy as part of a well-balanced diet.   goal in progress.   Personal Goal #3 Patient to identify strategies for weight loss of 0.5-2.0# per week.   goal not met.   Comments Goals in progress; however,  patient unable to complete program due to work conflicts.Andree Kayser has medical history of HTN, CAD, hyperlipidemia, s/p AVR, OSA.  Ahmon had started making many dietary changes to aid with heart health, weight loss. Lipids are well controlled on zetia , crestor .He is up 1.3# since starting with our program. Patient will benefit from adherence tonutrition, exercise, and lifestyle modification.      Intervention Plan   Intervention Prescribe, educate and counsel regarding individualized specific dietary modifications aiming towards targeted core components such as weight, hypertension, lipid management, diabetes, heart failure and other comorbidities.;Nutrition handout(s) given to patient.    Expected Outcomes Short Term Goal: Understand basic principles of dietary content, such as calories, fat, sodium, cholesterol and nutrients.;Long Term Goal: Adherence to prescribed nutrition plan.             Nutrition Discharge:   Education Questionnaire Score:  Knowledge Questionnaire Score - 06/23/23 1503       Knowledge Questionnaire Score   Pre Score 22/24            Perl attended 21 exercise and education classes between 06/23/23-07/26/23. Ethin did well with exercise while in attendance. Legacy is having issues with his lower back. Masato is stopping cardiac rehab so that he can participate in physical therapy. Monte Antonio RN BSN

## 2023-08-04 ENCOUNTER — Encounter (HOSPITAL_COMMUNITY)

## 2023-08-06 ENCOUNTER — Encounter (HOSPITAL_COMMUNITY)

## 2023-08-11 ENCOUNTER — Encounter (HOSPITAL_COMMUNITY)

## 2023-08-11 ENCOUNTER — Other Ambulatory Visit: Payer: Self-pay | Admitting: Physician Assistant

## 2023-08-13 ENCOUNTER — Encounter (HOSPITAL_COMMUNITY)

## 2023-08-16 ENCOUNTER — Encounter (HOSPITAL_COMMUNITY)

## 2023-08-18 ENCOUNTER — Encounter (HOSPITAL_COMMUNITY)

## 2023-08-20 ENCOUNTER — Encounter (HOSPITAL_COMMUNITY)

## 2023-08-23 ENCOUNTER — Encounter (HOSPITAL_COMMUNITY)

## 2023-08-25 ENCOUNTER — Encounter (HOSPITAL_COMMUNITY)

## 2023-08-27 ENCOUNTER — Encounter (HOSPITAL_COMMUNITY)

## 2023-08-30 ENCOUNTER — Encounter (HOSPITAL_COMMUNITY)

## 2023-09-01 ENCOUNTER — Encounter (HOSPITAL_COMMUNITY)

## 2023-09-03 ENCOUNTER — Encounter (HOSPITAL_COMMUNITY)

## 2023-09-06 ENCOUNTER — Encounter (HOSPITAL_COMMUNITY)

## 2023-09-08 ENCOUNTER — Encounter (HOSPITAL_COMMUNITY)

## 2023-09-09 ENCOUNTER — Encounter: Payer: Self-pay | Admitting: Adult Health

## 2023-09-09 ENCOUNTER — Telehealth: Admitting: Adult Health

## 2023-09-09 DIAGNOSIS — F39 Unspecified mood [affective] disorder: Secondary | ICD-10-CM | POA: Diagnosis not present

## 2023-09-09 NOTE — Progress Notes (Signed)
 Thomas [name] [id number] 10/11/1955 68 y.o.  Virtual Visit via Video Note  I connected with pt @ on 09/09/23 at  8:30 AM EDT by a video enabled telemedicine application and verified that I am speaking with the correct person using two identifiers.   I discussed the limitations of evaluation and management by telemedicine and the availability of in person appointments. The patient expressed understanding and agreed to proceed.  I discussed the assessment and treatment plan with the patient. The patient was provided an opportunity to ask questions and all were answered. The patient agreed with the plan and demonstrated an understanding of the instructions.   The patient was advised to call back or seek an in-person evaluation if the symptoms worsen or if the condition fails to improve as anticipated.  I provided 15 minutes of non-face-to-face time during this encounter.  The patient was located at home.  The provider was located at [facility].   Angeline LOISE Sayers, NP   Subjective:   Patient ID:  Thomas [name] is a 68 y.o. (DOB Feb 24, 1956) male.  Chief Complaint: No chief complaint on file.   HPI CANDEN CIESLINSKI presents for follow-up of mood disorder.  Referred by therapist Rocky Rattler.  Describes mood today as ok. Pleasant. Denies tearfulness. Mood symptoms - denies depression, anxiety and irritability. Reports stable interest and motivation. Denies panic attacks. Denies worry, rumination and over thinking. Reports moos is stable. Stating I feel like I'm doing good. Feels like the Trintellix  20mg  daily continues to work well. Reports he and wife have had some health issues over the past year. Taking medications as prescribed.  Energy levels stable. Active, has regular exercise routine.  Enjoys some usual interests and activities. Married. Lives with wife of 35 years. 2 grown sons. Spending time with family. Appetite adequate. Weight gain 265 to 272 pounds.  Sleeps  well most nights. Averages 8 to 9 hours. Reports some daytime napping. Diagnosed with sleep apnea - using CPAP machine. Focus and concentration stable. Completing tasks. Managing aspects of household. 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  Denies SI or HI.  Denies AH or VH. Denies substance use. Denies self harm.  Review of Systems:  Review of Systems  Musculoskeletal:  Negative for gait problem.  Neurological:  Negative for tremors.  Psychiatric/Behavioral:         Please refer to HPI    Medications: I have reviewed the patient's current medications.  Current Outpatient Medications  Medication Sig Dispense Refill   acetaminophen  (TYLENOL ) 500 MG tablet Take 1-2 tablets (500-1,000 mg total) by mouth every 6 (six) hours as needed.     aspirin  EC 81 MG tablet Take 81 mg by mouth daily. Swallow whole.     Biotin 1000 MCG tablet Take 1,000 mcg by mouth daily.     Boswellia-Glucosamine-Vit D (GLUCOSAMINE COMPLEX -BOSWELLIA PO) Take 1,500 mg by mouth daily.     Cholecalciferol (VITAMIN D -1000 MAX ST) 25 MCG (1000 UT) tablet Take 1,000 Units by mouth daily.     Coenzyme Q10 (CO Q 10) 100 MG CAPS Take 100 mg by mouth daily.     ezetimibe  (ZETIA ) 10 MG tablet TAKE ONE TABLET BY MOUTH ONE TIME DAILY 90 tablet 2   fluticasone  (FLONASE ) 50 MCG/ACT nasal spray Place 1 spray into both nostrils at bedtime.     Garlic 1000 MG CAPS Take 1,000 mg by mouth daily.     Multiple Vitamins-Minerals (  MULTIVITAMIN WITH MINERALS) tablet Take 1 tablet by mouth daily. Men     olmesartan  (BENICAR ) 20 MG tablet Take 1 tablet (20 mg total) by mouth daily. 90 tablet 3   Omega-3 Fatty Acids (OMEGA 3 PO) Take 1,500 mg by mouth daily.     rosuvastatin  (CRESTOR ) 40 MG tablet TAKE ONE TABLET BY MOUTH ONE TIME DAILY 90 tablet 2   vortioxetine  HBr (TRINTELLIX ) 20 MG TABS tablet Take 1 tablet (20 mg total) by mouth daily. 30 tablet 0   No current facility-administered medications for this  visit.    Medication Side Effects: None  Allergies:  Allergies  Allergen Reactions   Amoxicillin-Pot Clavulanate Other (See Comments) and Diarrhea    Past Medical History:  Diagnosis Date   Anemia    hx of in 1992    Anxiety    Arthritis    lower back    Back pain    Blockage of coronary artery of heart (HCC)    Constipation    Dysrhythmia    PVCs   Generalized muscle weakness    Hearing loss    Heart murmur    History of anxiety    History of hiatal hernia    Hyperlipemia    Hypertension    IBS (irritable bowel syndrome)    Joint pain    Lower urinary tract symptoms (LUTS)    Malignant neoplasm of prostate (HCC) 04/17/2014   Other fatigue    Pneumonia    hx x2 of pneumonia at age 75 and bronchitis x3   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   Shortness of breath on exertion    Ventral hernia without obstruction or gangrene 03/27/2016   Vitamin D  deficiency     Family History  Problem Relation Age of Onset   Eating disorder Mother    Alcohol abuse Mother    Bipolar disorder Mother    Anxiety disorder Mother    Depression Mother    Kidney disease Mother    Heart disease Mother    Hyperlipidemia Mother    Hypertension Mother    Heart attack Mother    Cancer Mother    COPD Mother    Ovarian cancer Mother    Heart failure Mother    Drug abuse Father    Cancer Father    Heart attack Paternal Grandfather    Heart disease Other    Kidney disease Other    Depression Other    Anxiety disorder Other    Bipolar disorder Other    Alcohol abuse Other    Drug abuse Other    Eating disorder Other     Social History   Socioeconomic History   Marital status: Married    Spouse name: Alan   Number of children: 2   Years of education: Not on file   Highest education level: Not on file  Occupational History   Occupation: Retired   Occupation: Retired from the car business but works part time at [employer]  Tobacco Use    Smoking status: Never   Smokeless tobacco: Never  Vaping Use   Vaping status: Never Used  Substance and Sexual Activity   Alcohol use: No    Alcohol/week: 0.0 standard drinks of alcohol   Drug use: No   Sexual activity: Yes    Partners: Male  Other Topics Concern   Not on file  Social History Narrative   Not on file   Social Drivers of Health  Financial Resource Strain: Not on file  Food Insecurity: No Food Insecurity (05/11/2023)   Hunger Vital Sign    Worried About Running Out of Food in the Last Year: Never true    Ran Out of Food in the Last Year: Never true  Transportation Needs: Patient Unable To Answer (05/11/2023)   PRAPARE - Transportation    Lack of Transportation (Medical): Patient unable to answer    Lack of Transportation (Non-Medical): Patient unable to answer  Physical Activity: Not on file  Stress: Not on file  Social Connections: Patient Unable To Answer (05/11/2023)   Social Connection and Isolation Panel    Frequency of Communication with Friends and Family: Patient unable to answer    Frequency of Social Gatherings with Friends and Family: Patient unable to answer    Attends Religious Services: Patient unable to answer    Active Member of Clubs or Organizations: Patient unable to answer    Attends Banker Meetings: Patient unable to answer    Marital Status: Patient unable to answer  Intimate Partner Violence: Patient Unable To Answer (05/11/2023)   Humiliation, Afraid, Rape, and Kick questionnaire    Fear of Current or Ex-Partner: Patient unable to answer    Emotionally Abused: Patient unable to answer    Physically Abused: Patient unable to answer    Sexually Abused: Patient unable to answer    Past Medical History, Surgical history, Social history, and Family history were reviewed and updated as appropriate.   Please see review of systems for further details on the patient's review from today.   Objective:   Physical Exam:  There  were no vitals taken for this visit.  Physical Exam Constitutional:      General: He is not in acute distress.  Musculoskeletal:        General: No deformity.   Neurological:     Mental Status: He is alert and oriented to person, place, and time.     Coordination: Coordination normal.   Psychiatric:        Attention and Perception: Attention and perception normal. He does not perceive auditory or visual hallucinations.        Mood and Affect: Mood normal. Mood is not anxious or depressed. Affect is not labile, blunt, angry or inappropriate.        Speech: Speech normal.        Behavior: Behavior normal.        Thought Content: Thought content normal. Thought content is not paranoid or delusional. Thought content does not include homicidal or suicidal ideation. Thought content does not include homicidal or suicidal plan.        Cognition and Memory: Cognition and memory normal.        Judgment: Judgment normal.     Comments: Insight intact     Lab Review:     Component Value Date/Time   NA 138 05/14/2023 0312   NA 137 10/14/2022 1058   K 4.4 05/14/2023 0312   CL 103 05/14/2023 0312   CO2 32 05/14/2023 0312   GLUCOSE 118 (H) 05/14/2023 0312   BUN 19 05/14/2023 0312   BUN 22 10/14/2022 1058   CREATININE 0.96 05/14/2023 0312   CALCIUM  8.9 05/14/2023 0312   PROT 7.3 05/07/2023 1136   PROT 7.4 06/10/2021 1529   ALBUMIN  4.5 05/07/2023 1136   ALBUMIN  5.1 (H) 06/10/2021 1529   AST 42 (H) 05/07/2023 1136   ALT 50 (H) 05/07/2023 1136   ALKPHOS 64 05/07/2023 1136  BILITOT 1.0 05/07/2023 1136   BILITOT 0.8 06/10/2021 1529   GFRNONAA >60 05/14/2023 0312   GFRAA 74 02/21/2019 0855       Component Value Date/Time   WBC 7.7 05/14/2023 0312   RBC 3.27 (L) 05/14/2023 0312   HGB 9.9 (L) 05/14/2023 0312   HGB 15.7 10/14/2022 1058   HCT 29.3 (L) 05/14/2023 0312   HCT 47.8 10/14/2022 1058   PLT 112 (L) 05/14/2023 0312   PLT 167 10/14/2022 1058   MCV 89.6 05/14/2023 0312   MCV  91 10/14/2022 1058   MCH 30.3 05/14/2023 0312   MCHC 33.8 05/14/2023 0312   RDW 13.4 05/14/2023 0312   RDW 12.9 10/14/2022 1058   LYMPHSABS 1.8 06/10/2021 1529   MONOABS 0.9 05/05/2009 1341   EOSABS 0.1 06/10/2021 1529   BASOSABS 0.1 06/10/2021 1529    No results found for: POCLITH, LITHIUM   No results found for: PHENYTOIN, PHENOBARB, VALPROATE, CBMZ   .res Assessment: Plan:    Plan:  PDMP reviewed  Continue Trintellix  20mg  daily  RTC 12 months  15 minutes spent dedicated to the care of this patient on the date of this encounter to include pre-visit review of records, ordering of medication, post visit documentation, and face-to-face time with the patient discussing depression, anxiety, insomnia and obsessional thoughts. Discussed continuing current medication regimen.  Patient advised to contact office with any questions, adverse effects, or acute worsening in signs and symptoms.  There are no diagnoses linked to this encounter.   Please see After Visit Summary for patient specific instructions.  Future Appointments  Date Time Provider Department Center  11/10/2023  8:00 AM Kate Lonni CROME, MD [facility]    No orders of the defined types were placed in this encounter.     -------------------------------

## 2023-09-10 ENCOUNTER — Encounter (HOSPITAL_COMMUNITY)

## 2023-09-13 ENCOUNTER — Encounter (HOSPITAL_COMMUNITY)

## 2023-09-15 ENCOUNTER — Encounter (HOSPITAL_COMMUNITY)

## 2023-10-28 ENCOUNTER — Telehealth: Payer: Self-pay

## 2023-10-28 ENCOUNTER — Ambulatory Visit: Payer: Self-pay | Admitting: General Surgery

## 2023-10-28 NOTE — Telephone Encounter (Signed)
..     Pre-operative Risk Assessment    Patient Name: Thomas [name]  DOB: 22-Jun-1955 MRN: [id number]   Date of last office visit: 06/16/23 Date of next office visit: 11/10/23   Request for Surgical Clearance    Procedure:  SEBACEOUS CYST   Date of Surgery:  Clearance TBD                                Surgeon:  DR CORDELLA IDLER Surgeon's Group or Practice Name:  [facility] Phone number:  [phone] Fax number:  [phone]   Type of Clearance Requested:   - Medical  - Pharmacy:  Hold Aspirin      Type of Anesthesia:  General    Additional requests/questions:    Bonney Teressa Rumalda Ronal   10/28/2023, 9:43 AM

## 2023-10-29 NOTE — Telephone Encounter (Signed)
   Name: Thomas [name]  DOB: 07-31-1955  MRN: [id number]  Primary Cardiologist: Lonni LITTIE Nanas, MD  Chart reviewed as part of pre-operative protocol coverage. The patient has an upcoming visit scheduled with Dr. Nanas on 11/10/2023 at which time clearance can be addressed in case there are any issues that would impact surgical recommendations.  SEBACEOUS CYST Is not scheduled until TBD as below. I added preop FYI to appointment note so that provider is aware to address at time of outpatient visit.  Per office protocol the cardiology provider should forward their finalized clearance decision and recommendations regarding antiplatelet therapy to the requesting party below.    This message will also be routed to pharmacy pool and/or Dr Nanas for input on holding aspirin  as requested below so that this information is available to the clearing provider at time of patient's appointment.   I will route this message as FYI to requesting party and remove this message from the preop box as separate preop APP input not needed at this time.   Please call with any questions.  [name], NP  10/29/2023, 8:13 AM

## 2023-11-07 ENCOUNTER — Other Ambulatory Visit: Payer: Self-pay | Admitting: General Practice

## 2023-11-07 NOTE — Progress Notes (Unsigned)
 Cardiology Office Note:    Date:  11/10/2023   ID:  Thomas [name], DOB June 28, 1955, MRN [id number]  PCP:  Charlott Dorn LABOR, MD  Cardiologist:  Lonni LITTIE Nanas, MD  Electrophysiologist:  None   Referring MD: Charlott Dorn LABOR, *   Chief Complaint  Patient presents with   s/p AVR    History of Present Illness:    Thomas [name] is a 68 y.o. male with a hx of CAD, hypertension, hyperlipidemia, aortic stenosis, obesity who presents for follow-up.  Echocardiogram 01/27/2019 showed EF 55 to 60%, moderate LVH, grade 1 diastolic dysfunction, normal RV function, mild to moderate aortic stenosis.  Coronary CTA on 02/23/2019 showed nonobstructive CAD, calcium  score 113 (87th percentile).  Zio patch x6 days on 06/12/2020 showed 1 episode of NSVT lasting 4 beats, frequent PVCs (6.3% of beats).  Echocardiogram 05/28/2021 showed EF 65 to 70%, moderate LVH, grade 1 diastolic dysfunction, normal RV function, severe left atrial enlargement, moderate aortic stenosis.  Echocardiogram 03/30/2022 showed EF 60 to 65%, grade 1 diastolic dysfunction, normal RV function, moderate to severe aortic stenosis.  Zio patch x 7 days 05/2022 showed frequent PVCs (7.7% of beats).  Echo 09/2022 showed normal biventricular function, severe aortic stenosis (mean gradient 47 mmHg, AVA 0.8 cm).  LHC/RHC 10/2022 showed 20% mid LAD stenosis, normal filling pressures.  He underwent AVR with 23 mm Inspiris Resilia valve on 05/11/2023 with Dr. Lucas.  Echo 06/22/2023 showed normal biventricular function, normal functioning bioprosthetic aortic valve.  Since last clinic visit, he reports he is doing well.  He is back to working, able to do all the things that he wants to do.  Denies any exertional chest pain or dyspnea.  He denies any lightheadedness, syncope, extreme edema, or palpitations.  BP Readings from Last 3 Encounters:  11/10/23 (!) 142/90  06/23/23 136/82  06/16/23 126/82    Wt Readings from Last 3 Encounters:   11/10/23 282 lb (127.9 kg)  06/23/23 274 lb 14.6 oz (124.7 kg)  06/16/23 270 lb 12.8 oz (122.8 kg)     Past Medical History:  Diagnosis Date   Anemia    hx of in 1992    Anxiety    Arthritis    lower back    Back pain    Blockage of coronary artery of heart (HCC)    Constipation    Dysrhythmia    PVCs   Generalized muscle weakness    Hearing loss    Heart murmur    History of anxiety    History of hiatal hernia    Hyperlipemia    Hypertension    IBS (irritable bowel syndrome)    Joint pain    Lower urinary tract symptoms (LUTS)    Malignant neoplasm of prostate (HCC) 04/17/2014   Other fatigue    Pneumonia    hx x2 of pneumonia at age 24 and bronchitis x3   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   Shortness of breath on exertion    Ventral hernia without obstruction or gangrene 03/27/2016   Vitamin D  deficiency     Past Surgical History:  Procedure Laterality Date   AORTIC VALVE REPLACEMENT N/A 05/11/2023   Procedure: AORTIC VALVE REPLACEMENT USING INSPIRIS RESILIA AORTIC VALVE SIZE 23 MM;  Surgeon: Lucas Dorise POUR, MD;  Location: [facility] OR;  Service: Open Heart Surgery;  Laterality: N/A;   COLONOSCOPY  2021   CYST EXCISION Left    axillary   CYST EXCISION  back of neck   Epididymis Excision Left    Spermocele x2   HEMORROIDECTOMY     HERNIA REPAIR Bilateral 1967 and 1986   x2 open inguinal hernia repairs   INCISION AND DRAINAGE OF WOUND  2017   Wound Infection Post OP   LASIK     1990s   LYMPHADENECTOMY Bilateral 05/17/2014   Procedure: PELVIC LYMPHADENECTOMY;  Surgeon: Gretel Ferrara, MD;  Location: [facility];  Service: Urology;  Laterality: Bilateral;   PROSTATE BIOPSY  03/22/2014   Gretel Ferrara   RIGHT/LEFT HEART CATH AND CORONARY ANGIOGRAPHY N/A 10/19/2022   Procedure: RIGHT/LEFT HEART CATH AND CORONARY ANGIOGRAPHY;  Surgeon: Verlin Lonni BIRCH, MD;  Location: [facility];  Service: Cardiovascular;  Laterality: N/A;   ROBOT  ASSISTED LAPAROSCOPIC RADICAL PROSTATECTOMY N/A 05/17/2014   Procedure: ROBOTIC ASSISTED LAPAROSCOPIC RADICAL PROSTATECTOMY LEVEL 2;  Surgeon: Gretel Ferrara, MD;  Location: [facility];  Service: Urology;  Laterality: N/A;   SKIN TAG REMOVAL     30 tags removed over many years   TEE WITHOUT CARDIOVERSION N/A 05/11/2023   Procedure: TRANSESOPHAGEAL ECHOCARDIOGRAM (TEE);  Surgeon: Lucas Dorise POUR, MD;  Location: [facility] OR;  Service: Open Heart Surgery;  Laterality: N/A;   VASECTOMY     VENTRAL HERNIA REPAIR N/A 03/27/2016   Procedure: LAPAROSCOPIC VENTRAL HERNIA;  Surgeon: Alm Angle, MD;  Location: [facility];  Service: General;  Laterality: N/A;  WITH MESH    Current Medications: Current Meds  Medication Sig   acetaminophen  (TYLENOL ) 500 MG tablet Take 1-2 tablets (500-1,000 mg total) by mouth every 6 (six) hours as needed.   amLODipine  (NORVASC ) 5 MG tablet Take 1 tablet (5 mg total) by mouth daily.   amoxicillin  (AMOXIL ) 500 MG capsule Take 4 capsules (2,000 mg total) by mouth once for 1 dose. 1 hour before dental procedure   aspirin  EC 81 MG tablet Take 81 mg by mouth daily. Swallow whole.   Biotin 1000 MCG tablet Take 1,000 mcg by mouth daily.   Blood Pressure Monitor MISC Check blood pressure once daily   Boswellia-Glucosamine-Vit D (GLUCOSAMINE COMPLEX -BOSWELLIA PO) Take 1,500 mg by mouth daily.   Cholecalciferol (VITAMIN D -1000 MAX ST) 25 MCG (1000 UT) tablet Take 1,000 Units by mouth daily.   Coenzyme Q10 (CO Q 10) 100 MG CAPS Take 100 mg by mouth daily.   ezetimibe  (ZETIA ) 10 MG tablet TAKE ONE TABLET BY MOUTH ONE TIME DAILY   fluticasone  (FLONASE ) 50 MCG/ACT nasal spray Place 1 spray into both nostrils at bedtime.   Garlic 1000 MG CAPS Take 1,000 mg by mouth daily.   Multiple Vitamins-Minerals (MULTIVITAMIN WITH MINERALS) tablet Take 1 tablet by mouth daily. Men   olmesartan  (BENICAR ) 20 MG tablet Take 1 tablet (20 mg total) by mouth daily.   Omega-3 Fatty Acids (OMEGA 3 PO) Take 1,500 mg  by mouth daily.   rosuvastatin  (CRESTOR ) 40 MG tablet TAKE ONE TABLET BY MOUTH ONE TIME DAILY   vortioxetine  HBr (TRINTELLIX ) 20 MG TABS tablet Take 1 tablet (20 mg total) by mouth daily.     Allergies:   Amoxicillin -pot clavulanate   Social History   Socioeconomic History   Marital status: Married    Spouse name: Alan   Number of children: 2   Years of education: Not on file   Highest education level: Not on file  Occupational History   Occupation: Retired   Occupation: Retired from the car business but works part time at [employer]  Smoking status: Never   Smokeless tobacco: Never  Vaping Use   Vaping status: Never Used  Substance and Sexual Activity   Alcohol use: No    Alcohol/week: 0.0 standard drinks of alcohol   Drug use: No   Sexual activity: Yes    Partners: Male  Other Topics Concern   Not on file  Social History Narrative   Not on file   Social Drivers of Health   Financial Resource Strain: Not on file  Food Insecurity: No Food Insecurity (05/11/2023)   Hunger Vital Sign    Worried About Running Out of Food in the Last Year: Never true    Ran Out of Food in the Last Year: Never true  Transportation Needs: Patient Unable To Answer (05/11/2023)   PRAPARE - Transportation    Lack of Transportation (Medical): Patient unable to answer    Lack of Transportation (Non-Medical): Patient unable to answer  Physical Activity: Not on file  Stress: Not on file  Social Connections: Patient Unable To Answer (05/11/2023)   Social Connection and Isolation Panel    Frequency of Communication with Friends and Family: Patient unable to answer    Frequency of Social Gatherings with Friends and Family: Patient unable to answer    Attends Religious Services: Patient unable to answer    Active Member of Clubs or Organizations: Patient unable to answer    Attends Banker Meetings: Patient unable to answer    Marital Status: Patient unable  to answer     Family History: The patient's family history includes Alcohol abuse in his mother and another family member; Anxiety disorder in his mother and another family member; Bipolar disorder in his mother and another family member; COPD in his mother; Cancer in his father and mother; Depression in his mother and another family member; Drug abuse in his father and another family member; Eating disorder in his mother and another family member; Heart attack in his mother and paternal grandfather; Heart disease in his mother and another family member; Heart failure in his mother; Hyperlipidemia in his mother; Hypertension in his mother; Kidney disease in his mother and another family member; Ovarian cancer in his mother.  ROS:   Please see the history of present illness.     All other systems reviewed and are negative.  EKGs/Labs/Other Studies Reviewed:    The following studies were reviewed today:   EKG:   01/08/22: Sinus rhythm, PVC, rate 64, no ST abnormalities 03/02/2023: Sinus rhythm, rate 60, nonspecific T wave flattening 06/16/23: Sinus rhythm, PVCs, rate 60, nonspecific T wave flattening, poor R wave progression 11/10/23: sinus bradycardia, rate 54, nonspecific T wave flattening, poor R wave progression  Recent Labs: 05/07/2023: ALT 50 05/12/2023: Magnesium  2.3 05/14/2023: BUN 19; Creatinine, Ser 0.96; Hemoglobin 9.9; Platelets 112; Potassium 4.4; Sodium 138  Recent Lipid Panel    Component Value Date/Time   CHOL 105 01/08/2022 1034   TRIG 83 01/08/2022 1034   HDL 40 01/08/2022 1034   CHOLHDL 2.6 01/08/2022 1034   LDLCALC 48 01/08/2022 1034    Physical Exam:    VS:  BP (!) 142/90   Pulse (!) 54   Ht 5' 11 (1.803 m)   Wt 282 lb (127.9 kg)   SpO2 95%   BMI 39.33 kg/m     Wt Readings from Last 3 Encounters:  11/10/23 282 lb (127.9 kg)  06/23/23 274 lb 14.6 oz (124.7 kg)  06/16/23 270 lb 12.8 oz (122.8 kg)  GEN:  Well nourished, well developed in no acute  distress HEENT: Normal NECK: No JVD; No carotid bruits CARDIAC: RRR, 2/6 systolic murmur RESPIRATORY:  Clear to auscultation without rales, wheezing or rhonchi  ABDOMEN: Soft, non-tender, non-distended MUSCULOSKELETAL:  No edema; No deformity  SKIN: Warm and dry NEUROLOGIC:  Alert and oriented x 3 PSYCHIATRIC:  Normal affect   ASSESSMENT:    1. S/P AVR   2. CAD in native artery   3. PVC (premature ventricular contraction)   4. Essential hypertension       PLAN:    Aortic stenosis:  Echo 09/2022 showed normal biventricular function, severe aortic stenosis (mean gradient 47 mmHg, AVA 0.8 cm).  LHC/RHC 10/2022 showed 20% mid LAD stenosis, normal filling pressures.  He underwent AVR with 23 mm Inspiris Resilia valve on 05/11/2023 with Dr. Lucas.  Echo 06/22/2023 showed normal biventricular function, normal functioning bioprosthetic aortic valve. - Has upcoming dental procedure, will prescribe amoxicillin  2 g to take 1 hour before procedure  CAD: Coronary CTA on 02/23/2019 showed nonobstructive CAD, calcium  score 113 (87th percentile).  Echocardiogram 05/28/2021 showed EF 65 to 70%, moderate LVH, grade 1 diastolic dysfunction, normal RV function, severe left atrial enlargement, moderate aortic stenosis. -Continue rosuvastatin  and Zetia   PVCs: Zio patch x 7 days 05/2022 showed frequent PVCs (7.7% of beats).  Has had low resting heart rate, not on AV nodal blocker.  Asymptomatic.  Hypertension: Was on valsartan -HCTZ 320-25 mg daily prior to AVR, now on olmesartan  20 mg daily - BP has been elevated, will add amlodipine  5 mg daily.  Asked to check BP daily for next 2 weeks and let us  know results  Hyperlipidemia: On rosuvastatin  40 mg daily and Zetia  10 mg daily.  LDL 47 on 12/10/22.    OSA: severe on sleep study 01/2022, started CPAP.  Reports compliance   RTC in 6 months   Medication Adjustments/Labs and Tests Ordered: Current medicines are reviewed at length with the patient today.   Concerns regarding medicines are outlined above.  Orders Placed This Encounter  Procedures   EKG 12-Lead   Meds ordered this encounter  Medications   amLODipine  (NORVASC ) 5 MG tablet    Sig: Take 1 tablet (5 mg total) by mouth daily.    Dispense:  180 tablet    Refill:  3   amoxicillin  (AMOXIL ) 500 MG capsule    Sig: Take 4 capsules (2,000 mg total) by mouth once for 1 dose. 1 hour before dental procedure    Dispense:  4 capsule    Refill:  0   Blood Pressure Monitor MISC    Sig: Check blood pressure once daily    Dispense:  1 each    Refill:  0    Patient Instructions  Medication Instructions:  START Amlodipine  5 mg daily  START Amoxicillin  2 G take 1 hour before dental appointment  START use of blood pressure monitor (check blood pressure once daily for 2 weeks, please advise office of results)  *If you need a refill on your cardiac medications before your next appointment, please call your pharmacy*  Follow-Up: At [facility], you and your health needs are our priority.  As part of our continuing mission to provide you with exceptional heart care, our providers are all part of one team.  This team includes your primary Cardiologist (physician) and Advanced Practice Providers or APPs (Physician Assistants and Nurse Practitioners) who all work together to provide you with the care you need, when you  need it.  Your next appointment:   6 month(s)  Provider:   Lonni LITTIE Nanas, MD    Other Instructions Blood Pressure Record Sheet To take your blood pressure, you will need a blood pressure machine. You can buy a blood pressure machine (blood pressure monitor) at your clinic, drug store, or online. When choosing one, consider: An automatic monitor that has an arm cuff. A cuff that wraps snugly around your upper arm. You should be able to fit only one finger between your arm and the cuff. A device that stores blood pressure reading results. Do not choose a  monitor that measures your blood pressure from your wrist or finger. Follow your health care provider's instructions for how to take your blood pressure. To use this form: Take your blood pressure medications every day These measurements should be taken when you have been at rest for at least 10-15 min Take at least 2 readings with each blood pressure check. This makes sure the results are correct. Wait 1-2 minutes between measurements. Write down the results in the spaces on this form. Keep in mind it should always be recorded systolic over diastolic. Both numbers are important.  Repeat this every day for 2-3 weeks, or as told by your health care provider.  Make a follow-up appointment with your health care provider to discuss the results.  Blood Pressure Log Date Medications taken? (Y/N) Blood Pressure Time of Day                                                                                                             Signed, Lonni LITTIE Nanas, MD  11/10/2023 8:52 AM    [facility]

## 2023-11-10 ENCOUNTER — Ambulatory Visit: Attending: Cardiovascular Disease | Admitting: Cardiology

## 2023-11-10 ENCOUNTER — Other Ambulatory Visit (HOSPITAL_COMMUNITY): Payer: Self-pay

## 2023-11-10 VITALS — BP 142/90 | HR 54 | Ht 71.0 in | Wt 282.0 lb

## 2023-11-10 DIAGNOSIS — I493 Ventricular premature depolarization: Secondary | ICD-10-CM | POA: Insufficient documentation

## 2023-11-10 DIAGNOSIS — I10 Essential (primary) hypertension: Secondary | ICD-10-CM | POA: Diagnosis not present

## 2023-11-10 DIAGNOSIS — Z952 Presence of prosthetic heart valve: Secondary | ICD-10-CM | POA: Insufficient documentation

## 2023-11-10 DIAGNOSIS — I2510 Atherosclerotic heart disease of native coronary artery without angina pectoris: Secondary | ICD-10-CM | POA: Diagnosis present

## 2023-11-10 MED ORDER — AMOXICILLIN 500 MG PO CAPS
2000.0000 mg | ORAL_CAPSULE | Freq: Once | ORAL | 0 refills | Status: AC
  Filled 2023-11-10: qty 4, 1d supply, fill #0

## 2023-11-10 MED ORDER — AMLODIPINE BESYLATE 5 MG PO TABS
5.0000 mg | ORAL_TABLET | Freq: Every day | ORAL | 3 refills | Status: DC
  Filled 2023-11-10: qty 90, 90d supply, fill #0

## 2023-11-10 MED ORDER — BLOOD PRESSURE MONITOR MISC
0 refills | Status: AC
  Filled 2023-11-10: qty 1, 30d supply, fill #0

## 2023-11-10 NOTE — Patient Instructions (Signed)
 Medication Instructions:  START Amlodipine  5 mg daily  START Amoxicillin  2 G take 1 hour before dental appointment  START use of blood pressure monitor (check blood pressure once daily for 2 weeks, please advise office of results)  *If you need a refill on your cardiac medications before your next appointment, please call your pharmacy*  Follow-Up: At [facility], you and your health needs are our priority.  As part of our continuing mission to provide you with exceptional heart care, our providers are all part of one team.  This team includes your primary Cardiologist (physician) and Advanced Practice Providers or APPs (Physician Assistants and Nurse Practitioners) who all work together to provide you with the care you need, when you need it.  Your next appointment:   6 month(s)  Provider:   Lonni LITTIE Nanas, MD    Other Instructions Blood Pressure Record Sheet To take your blood pressure, you will need a blood pressure machine. You can buy a blood pressure machine (blood pressure monitor) at your clinic, drug store, or online. When choosing one, consider: An automatic monitor that has an arm cuff. A cuff that wraps snugly around your upper arm. You should be able to fit only one finger between your arm and the cuff. A device that stores blood pressure reading results. Do not choose a monitor that measures your blood pressure from your wrist or finger. Follow your health care provider's instructions for how to take your blood pressure. To use this form: Take your blood pressure medications every day These measurements should be taken when you have been at rest for at least 10-15 min Take at least 2 readings with each blood pressure check. This makes sure the results are correct. Wait 1-2 minutes between measurements. Write down the results in the spaces on this form. Keep in mind it should always be recorded systolic over diastolic. Both numbers are important.  Repeat  this every day for 2-3 weeks, or as told by your health care provider.  Make a follow-up appointment with your health care provider to discuss the results.  Blood Pressure Log Date Medications taken? (Y/N) Blood Pressure Time of Day

## 2023-11-12 NOTE — Telephone Encounter (Signed)
 2nd request received: will route to the Preop APP for review. Pt was seen IN Office on 11/10/23

## 2023-11-12 NOTE — Telephone Encounter (Signed)
   Patient Name: Thomas [name]  DOB: 1956-02-20 MRN: [id number]  Primary Cardiologist: Lonni LITTIE Nanas, MD  Chart reviewed as part of pre-operative protocol coverage. Given past medical history and time since last visit, based on ACC/AHA guidelines, [facility] is at acceptable risk for the planned procedure without further cardiovascular testing. Last seen by Dr. Nanas on 11/10/2023 and was doing well. Follow up in 6 months.  May hold Aspirin  for 7 days and resume ASAP thereafter.   The patient was advised that if he develops new symptoms prior to surgery to contact our office to arrange for a follow-up visit, and he verbalized understanding.  I will route this recommendation to the requesting party via Epic fax function and remove from pre-op pool.  Please call with questions.  [name], NP 11/12/2023, 3:25 PM

## 2023-11-16 ENCOUNTER — Encounter: Payer: Self-pay | Admitting: Cardiology

## 2023-11-17 NOTE — Telephone Encounter (Signed)
 Recommend increase amlodipine  to 10 mg daily and schedule appointment at pharmacy hypertension clinic in 2 weeks.  Would bring BP log and monitor to calibrate to appointment

## 2023-11-25 NOTE — Telephone Encounter (Signed)
 BP significantly elevated.  Just to confirm- did he increase amlodipine  to 10 mg daily?  If so can we update his med list  Would suggest adding chlorthalidone  12.5 mg daily and check BMET, mag in 1 week  Also, plan was to schedule in pharmacy hypertension clinic but does not appear that it has been done.  Can we get him scheduled?  Would ask him to bring his BP log and home monitor to calibrate to appointment

## 2023-11-26 ENCOUNTER — Other Ambulatory Visit (HOSPITAL_BASED_OUTPATIENT_CLINIC_OR_DEPARTMENT_OTHER): Payer: Self-pay | Admitting: *Deleted

## 2023-11-26 DIAGNOSIS — I10 Essential (primary) hypertension: Secondary | ICD-10-CM

## 2023-11-26 MED ORDER — AMLODIPINE BESYLATE 5 MG PO TABS: 10.0000 mg | ORAL_TABLET | Freq: Every day | ORAL | 3 refills | Status: AC

## 2023-11-26 MED ORDER — CHLORTHALIDONE 12.5 MG PO TABS: 12.5000 mg | ORAL_TABLET | Freq: Every day | ORAL | 3 refills | Status: DC

## 2023-11-26 NOTE — Progress Notes (Signed)
 Called patient and made him aware that Dr. Kate would like for him to start Chlorthalidone  12.5 mg and Bmet, Mg in one week. Made patient aware that referral has been made for pharmacy referral for Hypertension and scheduling will call to schedule appt. Made patient aware that Dr. Kate recommended bring blood pressure log and monitor to visit to be calibrated. Understanding verbalized

## 2023-11-30 ENCOUNTER — Ambulatory Visit: Attending: Cardiology | Admitting: Pharmacist

## 2023-11-30 VITALS — BP 146/92 | HR 64

## 2023-11-30 DIAGNOSIS — I10 Essential (primary) hypertension: Secondary | ICD-10-CM | POA: Insufficient documentation

## 2023-11-30 NOTE — Patient Instructions (Addendum)
 Your blood pressure goal is < 130/34mmHg   Continue olmesartan  20 mg, amlodipine  10 mg, chlorthalidone  12.5 mg. Continue taking your blood pressure in the morning and writing them down. Bring machine and blood pressure log to next appointment in 2 weeks.  Important lifestyle changes to control high blood pressure  Intervention  Effect on the BP   Weight loss Weight loss is one of the most effective lifestyle changes for controlling blood pressure. If you're overweight or obese, losing even a small amount of weight can help reduce blood pressure.    Blood pressure can decrease by 1 millimeter of mercury (mmHg) with each kilogram (about 2.2 pounds) of weight lost.   Exercise regularly As a general goal, aim for 30 minutes of moderate physical activity every day.    Regular physical activity can lower blood pressure by 5 - 8 mmHg.   Eat a healthy diet Eat a diet rich in whole grains, fruits, vegetables, lean meat, and low-fat dairy products. Limit processed foods, saturated fat, and sweets.    A heart-healthy diet can lower high blood pressure by 10 mmHg.   Reduce salt (sodium) in your diet Aim for 000mg  of sodium each day. Avoid deli meats, canned food, and frozen microwave meals which are high in sodium.     Limiting sodium can reduce blood pressure by 5 mmHg.   Limit alcohol One drink equals 12 ounces of beer, 5 ounces of wine, or 1.5 ounces of 80-proof liquor.    Limiting alcohol to < 1 drink a day for women or < 2 drinks a day for men can help lower blood pressure by about 4 mmHg.   To check your pressure at home you will need to:   Sit up in a chair, with feet flat on the floor and back supported. Do not cross your ankles or legs. Rest your left arm so that the cuff is about heart level. If the cuff goes on your upper arm, then just relax your arm on the table, arm of the chair, or your lap. If you have a wrist cuff, hold your wrist against your chest at heart  level. Place the cuff snugly around your arm, about 1 inch above the crease of your elbow. The cords should be inside the groove of your elbow.  Sit quietly, with the cuff in place, for about 5 minutes. Then press the power button to start a reading. Do not talk or move while the reading is taking place.  Record your readings on a sheet of paper. Although most cuffs have a memory, it is often easier to see a pattern developing when the numbers are all in front of you.  You can repeat the reading after 1-3 minutes if it is recommended.   Make sure your bladder is empty and you have not had caffeine or tobacco within the last 30 minutes   Always bring your blood pressure log with you to your appointments. If you have not brought your monitor in to be double checked for accuracy, please bring it to your next appointment.   You can find a list of validated (accurate) blood pressure cuffs at: validatebp.org

## 2023-11-30 NOTE — Progress Notes (Signed)
 Patient ID: Thomas [name]                 DOB: April 27, 1955                      MRN: [id number]      HPI: Thomas [name] is a 68 y.o. male referred by Dr. Lonni Nanas, MD to HTN clinic. PMH is significant for hx of CAD, hypertension, hyperlipidemia, aortic stenosis, obesity.  In 2020, patient had an echo that showed mild to moderate aortic stenosis. He had a calcium  score of 113 in the 87th percentile with nonobstructive CAD. Aortic stenosis continued to progress to moderate. In 2024, echo showed severe aortic stenosis and mild LAD stenosis. Patient underwent AVR in 04/2023. Before his surgery he was on valsartan -hydrochlorothiazide , which was then changed to [facility] after surgery.  Patient saw Dr. Nanas on 8/27 and BP was 142/90 in office. Amlodipine  5 mg was added. Patient messaged provider with home readings avg 160s-170s/100s. Patient was initiated on chlorthalidone  12.5 mg and amlodipine  10 mg.   Today patient presents to HTN clinic for initial PharmD. Patient reports that he is doing well. He confirms his blood pressure medications and other medications. He is not currently taking any cholesterol medications right now after shared patient decision making with Dr. Charlott.  He takes his BP meds in the morning and will check his BP an hour after. If he does not check in the morning, typically will check between 3-8 in the evening. Patient accidentally forgot his BP machine this morning. He was able to provide BP readings from the last few days. Listed below.   Patient works at [employer] about 6-8 hours/day a few days during the week. 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 He also works for a [employer]. He is active at both of these jobs.   Patient left cardiac rehab as he felt like he could do the exercises on his own. Admits he has not been to the gym to do them. He used to go to [employer] since his insurance pays for [employer]. He would use the recombinant bike for  45 mins on level 2 or 3. Discussed that moderate intensity exercise for 150 mins is recommended. The recombinant bike a few times a week is a good option to obtain this goal. Patient is interested in going back to [employer] to complete this.   Patient's medications were just changed by [name] on 9/2 and has only been taking for a week. No medication changes will be made today to see the effect of the increased amlodipine  and addition of chlorthalidone .  Current HTN meds: olmesartan  20 mg, amlodipine  10 mg, chlorthalidone  12.5 mg Previously tried: valsartan -HCTZ 320-25 mg BP goal: <130/80  Family History: not discussed in today's visit  Social History: no smoking or alcohol  Diet: 3 meals/day Breakfast: eggs and bacon/sausage, grits Lunch: buys chicken sandwich when out, stop by [employer] salad with grilled chicken, non processed [place] with mustard on a sandwich Dinner: protein, veggies, salads with [place] dressing Drinks: unsweet tea, weaned off sweets, diet soda, 60 oz of water a day - uses his Yeti, decaf coffee only with skim milk Does not use any salt   Exercise:  Home exercise routine includes squats (20-30/day). Gym/ health club routine includes cardio and stationary bike. At [facility], walks ~8,000 steps a day, climbs ladder, pulls product down Works a Gaffer job: moving, up and down, lifting  Home BP  readings:  Date SBP/DBP  HR  9/15 154/94 62  9/14 143/83 67  9/13 155/97   9/12 168/97  64              Average      Wt Readings from Last 3 Encounters:  11/10/23 282 lb (127.9 kg)  06/23/23 274 lb 14.6 oz (124.7 kg)  06/16/23 270 lb 12.8 oz (122.8 kg)   BP Readings from Last 3 Encounters:  11/30/23 (!) 146/92  11/10/23 (!) 142/90  06/23/23 136/82   Pulse Readings from Last 3 Encounters:  11/30/23 64  11/10/23 (!) 54  06/23/23 62    Renal function: CrCl cannot be calculated (Patient's most recent lab result is older than the maximum 21 days  allowed.).  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     amLODipine  (NORVASC ) 5 MG tablet Take 2 tablets (10 mg total) by mouth daily. 180 tablet 3   aspirin  EC 81 MG tablet Take 81 mg by mouth daily. Swallow whole.     Blood Pressure Monitor MISC Check blood pressure once daily 1 each 0   Boswellia-Glucosamine-Vit D (GLUCOSAMINE COMPLEX -BOSWELLIA PO) Take 1,500 mg by mouth daily.     Chlorthalidone  12.5 MG TABS Take 12.5 mg by mouth daily. 90 tablet 3   Cholecalciferol (VITAMIN D -1000 MAX ST) 25 MCG (1000 UT) tablet Take 1,000 Units by mouth daily.     Coenzyme Q10 (CO Q 10) 100 MG CAPS Take 100 mg by mouth daily.     fluticasone  (FLONASE ) 50 MCG/ACT nasal spray Place 1 spray into both nostrils at bedtime.     Multiple Vitamins-Minerals (MULTIVITAMIN WITH MINERALS) tablet Take 1 tablet by mouth daily. Men     olmesartan  (BENICAR ) 20 MG tablet Take 1 tablet (20 mg total) by mouth daily. 90 tablet 3   Omega-3 Fatty Acids (OMEGA 3 PO) Take 1,500 mg by mouth daily.     vortioxetine  HBr  (TRINTELLIX ) 20 MG TABS tablet Take 1 tablet (20 mg total) by mouth daily. 30 tablet 0   Biotin 1000 MCG tablet Take 1,000 mcg by mouth daily.     ezetimibe  (ZETIA ) 10 MG tablet TAKE ONE TABLET BY MOUTH ONE TIME DAILY 90 tablet 2   Garlic 1000 MG CAPS Take 1,000 mg by mouth daily.  rosuvastatin  (CRESTOR ) 40 MG tablet TAKE ONE TABLET BY MOUTH ONE TIME DAILY 90 tablet 2   No current facility-administered medications on file prior to visit.    Allergies  Allergen Reactions   Amoxicillin -Pot Clavulanate Other (See Comments) and Diarrhea    Blood pressure (!) 146/92, pulse 64.   Assessment/Plan: HYPERTENSION CONTROL Vitals:   11/30/23 1152 11/30/23 1153  BP: (!) 150/92 (!) 146/92    The patient's blood pressure is elevated above target today.  In order to address the patient's elevated BP: Blood pressure will be monitored at home to determine if medication changes need to be made.      1. Hypertension -  Essential hypertension Assessment: BP is not currently at goal <130/80 BP in clinic 146/92 on olmesartan  20 mg, amlodipine  10 mg, chlorthalidone  12.5 mg Patient's BP was previously managed with valsartan -hydrochlorothiazide  BP regimen was recently changed to amlodipine  10 mg and adding chlorthalidone  12.5 mg. Patient denies any side effects (swelling or increased urination) or headaches, dizziness, lightheadedness Discussed lifestyle modifications with increasing exercise. Encouraged to get back into [employer] to meet exercise goal of 150 mins of moderate intensity movement during the week Patient to continue taking BP at home with machine and log them. Patient to bring in machine and log at next appointment. No medication changes at this time. Medication changes to be made after patient has been on new therapy for some time.  Plan: Continue olmesartan  20 mg, amlodipine  10 mg, chlorthalidone  12.5 mg Continue taking BP at home and write them down. Bring in BP machine and  log to next appointment. Follow up in 2 weeks    Jenkins Graces, PharmD PGY1 Pharmacy Resident [phone]    Thank you  Nyisha Clippard D Shlok Raz, Pharm.JONETTA SARAN, CPP [facility] A Division of [facility] [address] [place], [place] [address]  Phone: [phone]; Fax: [phone]

## 2023-11-30 NOTE — Assessment & Plan Note (Signed)
 Assessment: BP is not currently at goal <130/80 BP in clinic 146/92 on olmesartan  20 mg, amlodipine  10 mg, chlorthalidone  12.5 mg Patient's BP was previously managed with valsartan -hydrochlorothiazide  BP regimen was recently changed to amlodipine  10 mg and adding chlorthalidone  12.5 mg. Patient denies any side effects (swelling or increased urination) or headaches, dizziness, lightheadedness Discussed lifestyle modifications with increasing exercise. Encouraged to get back into [employer] to meet exercise goal of 150 mins of moderate intensity movement during the week Patient to continue taking BP at home with machine and log them. Patient to bring in machine and log at next appointment. No medication changes at this time. Medication changes to be made after patient has been on new therapy for some time.  Plan: Continue olmesartan  20 mg, amlodipine  10 mg, chlorthalidone  12.5 mg Continue taking BP at home and write them down. Bring in BP machine and log to next appointment. Follow up in 2 weeks

## 2023-12-13 ENCOUNTER — Other Ambulatory Visit: Payer: Self-pay | Admitting: Adult Health

## 2023-12-13 ENCOUNTER — Telehealth: Payer: Self-pay | Admitting: Adult Health

## 2023-12-13 DIAGNOSIS — F39 Unspecified mood [affective] disorder: Secondary | ICD-10-CM

## 2023-12-13 NOTE — Telephone Encounter (Signed)
 Pt needs new script for Trintellix . After today has 5 pills. He said he is on a program where they send them to his house 4x a year but they said he needs a new script.

## 2023-12-13 NOTE — Telephone Encounter (Signed)
 RF sent via pharmacy interface.

## 2023-12-14 ENCOUNTER — Ambulatory Visit: Attending: Cardiology | Admitting: Pharmacist

## 2023-12-14 DIAGNOSIS — I10 Essential (primary) hypertension: Secondary | ICD-10-CM | POA: Insufficient documentation

## 2023-12-14 NOTE — Assessment & Plan Note (Signed)
 Assessment: Blood pressure above goal in clinic today Blood pressure also above goal at home-running mainly in the 140s -150s/90 Patient has increased his physical activity-walking more He is also working on cutting out sweets and reports losing some weight Still needs a BMP since the addition of chlorthalidone   Plan: Check BMP If stable will increase chlorthalidone  to 25 mg daily and olmesartan  to 40 mg daily Continue amlodipine  10 mg daily Follow-up in 3 weeks

## 2023-12-14 NOTE — Patient Instructions (Signed)
 Your blood pressure goal is < 130/68mmHg   Complete lab work. Melissa will contact you later this week with your lab results and medication plan.  Continue amlodipine  10 mg, chlorthalidone  12.5 mg, and olmesartan  20 mg daily.  Continue checking your blood pressure at home and write down blood pressures on log. Bring log into next appointment.  Important lifestyle changes to control high blood pressure  Intervention  Effect on the BP   Weight loss Weight loss is one of the most effective lifestyle changes for controlling blood pressure. If you're overweight or obese, losing even a small amount of weight can help reduce blood pressure.    Blood pressure can decrease by 1 millimeter of mercury (mmHg) with each kilogram (about 2.2 pounds) of weight lost.   Exercise regularly As a general goal, aim for 30 minutes of moderate physical activity every day.    Regular physical activity can lower blood pressure by 5 - 8 mmHg.   Eat a healthy diet Eat a diet rich in whole grains, fruits, vegetables, lean meat, and low-fat dairy products. Limit processed foods, saturated fat, and sweets.    A heart-healthy diet can lower high blood pressure by 10 mmHg.   Reduce salt (sodium) in your diet Aim for 000mg  of sodium each day. Avoid deli meats, canned food, and frozen microwave meals which are high in sodium.     Limiting sodium can reduce blood pressure by 5 mmHg.   Limit alcohol One drink equals 12 ounces of beer, 5 ounces of wine, or 1.5 ounces of 80-proof liquor.    Limiting alcohol to < 1 drink a day for women or < 2 drinks a day for men can help lower blood pressure by about 4 mmHg.   To check your pressure at home you will need to:   Sit up in a chair, with feet flat on the floor and back supported. Do not cross your ankles or legs. Rest your left arm so that the cuff is about heart level. If the cuff goes on your upper arm, then just relax your arm on the table, arm of the chair,  or your lap. If you have a wrist cuff, hold your wrist against your chest at heart level. Place the cuff snugly around your arm, about 1 inch above the crease of your elbow. The cords should be inside the groove of your elbow.  Sit quietly, with the cuff in place, for about 5 minutes. Then press the power button to start a reading. Do not talk or move while the reading is taking place.  Record your readings on a sheet of paper. Although most cuffs have a memory, it is often easier to see a pattern developing when the numbers are all in front of you.  You can repeat the reading after 1-3 minutes if it is recommended.   Make sure your bladder is empty and you have not had caffeine or tobacco within the last 30 minutes   Always bring your blood pressure log with you to your appointments. If you have not brought your monitor in to be double checked for accuracy, please bring it to your next appointment.   You can find a list of validated (accurate) blood pressure cuffs at: validatebp.org

## 2023-12-14 NOTE — Progress Notes (Signed)
 Patient ID: MARWAN LIPE                 DOB: 10-23-1955                      MRN: [id number]      HPI: Thomas [name] is a 68 y.o. male referred by Dr. Lonni Nanas, MD to HTN clinic. PMH is significant for hx of CAD, hypertension, hyperlipidemia, aortic stenosis, obesity.  In 2020, patient had an echo that showed mild to moderate aortic stenosis. He had a calcium  score of 113 in the 87th percentile with nonobstructive CAD. Aortic stenosis continued to progress to moderate. In 2024, echo showed severe aortic stenosis and mild LAD stenosis. Patient underwent AVR in 04/2023. Before his surgery he was on valsartan -hydrochlorothiazide , which was then changed to [facility] after surgery.  Patient saw Dr. Nanas on 8/27 and BP was 142/90 in office. Amlodipine  5 mg was added. Patient messaged provider with home readings avg 160s-170s/100s. Patient was initiated on chlorthalidone  12.5 mg and amlodipine  10 mg.   At last visit with Pharm.D. no medication changes were made as patient had just had adjustments to his medications.  Lifestyle modifications were discussed.  Patient presents today to clinic for follow-up.  He brings in his home blood pressure cuff which was found to be accurate.  Home blood pressure readings elevated.  Patient was unable to have his labs drawn after last visit due to the lab being very busy and he needing to get back to work.   Of note patient did state he is not taking his cholesterol medications until he can discuss with his PCP.  140/92, HR 60 home meter 140/90, HR 142/88, HR 62 142/95, HR 61 Recommend start re-checking after a higher readings  Current HTN meds: olmesartan  20 mg, amlodipine  10 mg, chlorthalidone  12.5 mg Previously tried: valsartan -HCTZ 320-25 mg BP goal: <130/80  Family History: not discussed in today's visit  Social History: no smoking or alcohol  Diet: 3 meals/day Breakfast: eggs and bacon/sausage, grits Lunch: buys chicken  sandwich when out, stop by [employer] salad with grilled chicken, non processed [place] with mustard on a sandwich Dinner: protein, veggies, salads with [place] dressing Drinks: unsweet tea, weaned off sweets, diet soda, 60 oz of water a day - uses his Yet[employer], decaf coffee only with skim milk Does not use any salt   Exercise:  Home exercise routine includes squats (20-30/day). Gym/ health club routine includes cardio and stationary bike. 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, up and down, lifting Down about 4 pounds, will treat himself to a mini coke Walking since last visit  Home BP readings:  Date SBP/DBP  HR  9/24 154/96   9/25 143/95   9/26 155/92   9/28 152/90   9/30 142/95           Average      Wt Readings from Last 3 Encounters:  11/10/23 282 lb (127.9 kg)  06/23/23 274 lb 14.6 oz (124.7 kg)  06/16/23 270 lb 12.8 oz (122.8 kg)   BP Readings from Last 3 Encounters:  12/14/23 (!) 140/90  11/30/23 (!) 146/92  11/10/23 (!) 142/90   Pulse Readings from Last 3 Encounters:  12/14/23 62  11/30/23 64  11/10/23 (!) 54    Renal function: CrCl cannot be calculated (Patient's most recent lab result is older than the maximum 21 days allowed.).  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 180 tablet 3   Chlorthalidone  12.5 MG TABS Take 12.5 mg by mouth daily. 90 tablet 3   olmesartan  (BENICAR ) 20 MG tablet Take 1 tablet (20 mg total) by mouth daily. 90 tablet 3   acetaminophen  (TYLENOL ) 500 MG tablet Take 1-2 tablets (500-1,000 mg total) by mouth every 6 (six) hours as needed.     aspirin  EC 81 MG tablet Take 81 mg by mouth daily. Swallow whole.     Biotin 1000 MCG tablet Take 1,000 mcg by mouth daily.     Blood Pressure Monitor MISC Check blood pressure once daily 1 each 0   Boswellia-Glucosamine-Vit D (GLUCOSAMINE COMPLEX -BOSWELLIA PO) Take 1,500 mg by mouth daily.     Cholecalciferol (VITAMIN D -1000 MAX ST) 25 MCG (1000 UT) tablet Take 1,000 Units by mouth daily.     Coenzyme Q10 (CO Q 10) 100 MG CAPS Take 100 mg by mouth daily.     ezetimibe  (ZETIA ) 10 MG tablet TAKE ONE TABLET BY MOUTH ONE TIME DAILY 90 tablet 2   fluticasone  (FLONASE ) 50 MCG/ACT nasal spray Place 1 spray into both nostrils at bedtime.     Garlic 1000 MG CAPS Take 1,000 mg by mouth daily.     Multiple Vitamins-Minerals (MULTIVITAMIN WITH MINERALS) tablet Take 1 tablet by mouth daily. Men     Omega-3 Fatty Acids (OMEGA 3 PO) Take 1,500 mg by mouth daily.     rosuvastatin  (CRESTOR ) 40 MG tablet TAKE ONE TABLET BY MOUTH ONE TIME DAILY 90 tablet 2   TRINTELLIX  20 MG TABS tablet TAKE ONE TABLET BY MOUTH DAILY 90 tablet 2   No current facility-administered medications on file prior to visit.    Allergies  Allergen Reactions   Amoxicillin -Pot Clavulanate Other (See Comments) and Diarrhea    Blood pressure (!) 140/90, pulse 62.   Assessment/Plan: HYPERTENSION CONTROL Vitals:   12/14/23 1451 12/14/23 1640  BP: (!) 142/88 (!) 140/90    The patient's blood pressure  is elevated above target today.  In order to address the patient's elevated BP: Labs and/or other diagnostics are currently pending prior to making blood pressure medication adjustments.      1. Hypertension -  Essential hypertension Assessment: Blood pressure above goal in clinic today Blood pressure also above goal at home-running mainly in  the 140s -150s/90 Patient has increased his physical activity-walking more He is also working on cutting out sweets and reports losing some weight Still needs a BMP since the addition of chlorthalidone   Plan: Check BMP If stable will increase chlorthalidone  to 25 mg daily and olmesartan  to 40 mg daily Continue amlodipine  10 mg daily Follow-up in 3 weeks     Jenkins Graces, PharmD PGY1 Pharmacy Resident [phone]    Thank you  Eleanor JONETTA Crews, Pharm.JONETTA SARAN, CPP [facility] A Division of [facility] [address], [place], [place] [address]  Phone: [phone]; Fax: [phone]

## 2023-12-16 ENCOUNTER — Encounter: Payer: Self-pay | Admitting: Pharmacist

## 2023-12-16 LAB — BASIC METABOLIC PANEL WITH GFR
BUN/Creatinine Ratio: 21 (ref 10–24)
BUN: 22 mg/dL (ref 8–27)
CO2: 22 mmol/L (ref 20–29)
Calcium: 10.2 mg/dL (ref 8.6–10.2)
Chloride: 99 mmol/L (ref 96–106)
Creatinine, Ser: 1.04 mg/dL (ref 0.76–1.27)
Glucose: 81 mg/dL (ref 70–99)
Potassium: 4.5 mmol/L (ref 3.5–5.2)
Sodium: 139 mmol/L (ref 134–144)
eGFR: 79 mL/min/1.73 (ref >59)

## 2023-12-16 LAB — MAGNESIUM: Magnesium: 2.2 mg/dL (ref 1.6–2.3)

## 2023-12-16 MED ORDER — CHLORTHALIDONE 25 MG PO TABS: 25.0000 mg | ORAL_TABLET | Freq: Every day | ORAL | 3 refills | Status: AC

## 2023-12-16 MED ORDER — OLMESARTAN MEDOXOMIL 40 MG PO TABS: 40.0000 mg | ORAL_TABLET | Freq: Every day | ORAL | 3 refills | Status: AC

## 2023-12-17 ENCOUNTER — Ambulatory Visit: Payer: Self-pay | Admitting: Cardiology

## 2024-01-05 ENCOUNTER — Ambulatory Visit: Attending: Cardiology | Admitting: Pharmacist

## 2024-01-05 VITALS — BP 124/82 | HR 64

## 2024-01-05 DIAGNOSIS — I10 Essential (primary) hypertension: Secondary | ICD-10-CM | POA: Insufficient documentation

## 2024-01-05 NOTE — Progress Notes (Signed)
 Patient ID: Thomas [name]                 DOB: 1955/05/16                      MRN: [id number]      HPI: Thomas [name] is a 68 y.o. male referred by Dr. Lonni Nanas, MD to HTN clinic. PMH is significant for hx of CAD, hypertension, hyperlipidemia, aortic stenosis, obesity.  In 2020, patient had an echo that showed mild to moderate aortic stenosis. He had a calcium  score of 113 in the 87th percentile with nonobstructive CAD. Aortic stenosis continued to progress to moderate. In 2024, echo showed severe aortic stenosis and mild LAD stenosis. Patient underwent AVR in 04/2023. Before his surgery he was on valsartan -hydrochlorothiazide , which was then changed to [facility] after surgery.  Patient saw Dr. Nanas on 8/27 and BP was 142/90 in office. Amlodipine  5 mg was added. Patient messaged provider with home readings avg 160s-170s/100s. Patient was initiated on chlorthalidone  12.5 mg and amlodipine  10 mg.   At last visit with Pharm.D. chlorthalidone  was increased to 25mg  and olmesartan  was increased to 40mg .  Patient's home monitor previously found to be accurate.   He did resume his cholesterol at half the dose.  Patient presents today for follow up. Denies any dizziness, headache or swelling. Is happy with his improved BP. Has been doing more exercise.  Current HTN meds: olmesartan  40 mg, amlodipine  10 mg, chlorthalidone  25 mg Previously tried: valsartan -HCTZ 320-25 mg BP goal: <130/80  Family History: not discussed in today's visit  Social History: no smoking or alcohol  Diet: 3 meals/day Breakfast: eggs and bacon/sausage, grits Lunch: buys chicken sandwich when out, stop by [employer] salad with grilled chicken, non processed [place] with mustard on a sandwich Dinner: protein, veggies, salads with [place] dressing Drinks: unsweet tea, weaned off sweets, diet soda, 60 oz of water a day - uses his Yeti, decaf coffee only with skim milk Does not use any salt  Increased water  intake and stopped sugary drinks  Exercise:  Home exercise routine includes squats (20-30/day). Gym/ health club routine includes cardio and stationary bike. 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, up and down, lifting Down about 4 pounds, will treat himself to a mini coke Walking more since the last visit  Home BP readings:  Ranging from 112-140/70-low 80's Mostly 120's recently  Wt Readings from Last 3 Encounters:  11/10/23 282 lb (127.9 kg)  06/23/23 274 lb 14.6 oz (124.7 kg)  06/16/23 270 lb 12.8 oz (122.8 kg)   BP Readings from Last 3 Encounters:  01/05/24 124/82  12/14/23 (!) 140/90  11/30/23 (!) 146/92   Pulse Readings from Last 3 Encounters:  01/05/24 64  12/14/23 62  11/30/23 64    Renal function: CrCl cannot be calculated (Patient's most recent lab result is older than the maximum 21 days allowed.).  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     amLODipine  (NORVASC ) 5 MG tablet Take 2 tablets (10 mg  total) by mouth daily. 180 tablet 3   aspirin  EC 81 MG tablet Take 81 mg by mouth daily. Swallow whole.     Biotin 1000 MCG tablet Take 1,000 mcg by mouth daily.     Blood Pressure Monitor MISC Check blood pressure once daily 1 each 0   Boswellia-Glucosamine-Vit D (GLUCOSAMINE COMPLEX -BOSWELLIA PO) Take 1,500 mg by mouth daily.     chlorthalidone  (HYGROTON ) 25 MG tablet Take 1 tablet (25 mg total) by mouth daily. 90 tablet 3   Cholecalciferol (VITAMIN D -1000 MAX ST) 25 MCG (1000 UT) tablet Take 1,000 Units by mouth daily.     Coenzyme Q10 (CO Q 10) 100 MG CAPS Take 100 mg by mouth daily.     ezetimibe  (ZETIA ) 10 MG tablet TAKE ONE TABLET BY MOUTH ONE TIME DAILY 90 tablet 2   fluticasone  (FLONASE ) 50 MCG/ACT nasal spray Place 1 spray into both nostrils at bedtime.     Garlic 1000 MG CAPS Take 1,000 mg by mouth daily.     Multiple Vitamins-Minerals (MULTIVITAMIN WITH MINERALS) tablet Take 1 tablet by mouth daily. Men     olmesartan  (BENICAR ) 40 MG tablet Take 1 tablet (40 mg total) by mouth daily. 90 tablet 3   Omega-3 Fatty Acids (OMEGA 3 PO) Take 1,500 mg by mouth daily.     rosuvastatin  (CRESTOR ) 40 MG tablet TAKE ONE TABLET BY MOUTH ONE TIME DAILY 90 tablet 2   TRINTELLIX  20 MG TABS tablet TAKE ONE TABLET BY MOUTH DAILY 90 tablet 2   No current facility-administered medications on file prior to visit.    Allergies  Allergen Reactions   Amoxicillin -Pot Clavulanate Other (See Comments) and Diarrhea    Blood pressure 124/82, pulse 64.   Assessment/Plan:     1. Hypertension -  Essential hypertension Assessment:  - BP is controlled in office 124/82 mmHg  - Majority of home BP reading were within range, Range was (110-140) / (70-90) mmHg  - Patient is tolerating olmesartan  40 mg and chlorthalidone  25 mg well, no adverse effects.  - denies headache, lightheadedness, and chest pain  - Has increase water intake and stopped sugary drink  - Increased his walking since the last  visit  Plan:   - Will need a new BMP  - Continue taking olmesartan  40 mg, chlorthalidone  25 mg, and amlodipine  10 mg - Continue to check BP at home and log BP - Follow up in 4-6 weeks no people get confused and think that those were the most recent 1    Tou S Vang, PharmD Student   Thank you  Chala Gul D [place] Hert, Pharm.D, BCACP, CPP [facility]  [facility] [address], [place], [place] [address]  Phone: [phone]; Fax: [phone]

## 2024-01-06 ENCOUNTER — Ambulatory Visit: Payer: Self-pay | Admitting: Pharmacist

## 2024-01-06 LAB — BASIC METABOLIC PANEL WITH GFR
BUN/Creatinine Ratio: 19 (ref 10–24)
BUN: 21 mg/dL (ref 8–27)
CO2: 24 mmol/L (ref 20–29)
Calcium: 10.4 mg/dL — ABNORMAL HIGH (ref 8.6–10.2)
Chloride: 100 mmol/L (ref 96–106)
Creatinine, Ser: 1.08 mg/dL (ref 0.76–1.27)
Glucose: 92 mg/dL (ref 70–99)
Potassium: 4.6 mmol/L (ref 3.5–5.2)
Sodium: 140 mmol/L (ref 134–144)
eGFR: 75 mL/min/1.73 (ref >59)

## 2024-01-11 ENCOUNTER — Ambulatory Visit (HOSPITAL_COMMUNITY): Admit: 2024-01-11 | Admitting: General Surgery

## 2024-01-11 SURGERY — EXCISION, LESION, SCALP
Anesthesia: General

## 2024-01-17 ENCOUNTER — Encounter: Payer: Self-pay | Admitting: Cardiology

## 2024-02-14 ENCOUNTER — Ambulatory Visit: Admitting: Pharmacist

## 2024-03-21 ENCOUNTER — Telehealth: Payer: Self-pay | Admitting: Adult Health

## 2024-03-21 DIAGNOSIS — Z0289 Encounter for other administrative examinations: Secondary | ICD-10-CM

## 2024-03-21 NOTE — Telephone Encounter (Signed)
 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 Paid $45. Needs it by Thursday and advised we will call him when its ready.

## 2024-03-22 DIAGNOSIS — Z0279 Encounter for issue of other medical certificate: Secondary | ICD-10-CM

## 2024-03-22 NOTE — Telephone Encounter (Signed)
"  error  "

## 2024-03-23 ENCOUNTER — Telehealth: Payer: Self-pay | Admitting: Cardiology

## 2024-03-23 NOTE — Telephone Encounter (Signed)
 Patient brought in a D.[place]. form.  Patient signed the Release of Information and paid $29 fee.  Form in Dr. Alvan box.

## 2024-03-26 NOTE — Telephone Encounter (Signed)
 Paperwork completed.

## 2024-03-27 ENCOUNTER — Telehealth: Payer: Self-pay | Admitting: *Deleted

## 2024-03-27 NOTE — Telephone Encounter (Signed)
 Paperwork faxed

## 2024-03-27 NOTE — Telephone Encounter (Signed)
 Paperwork completed.

## 2024-03-27 NOTE — Telephone Encounter (Signed)
 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 Writer was unable to fax paperwork to [phone]. Confirmation was received that paperwork has been successfully faxed to [phone]. Pt verbalized an understanding.

## 2024-03-28 ENCOUNTER — Ambulatory Visit: Admitting: Pharmacist

## 2024-03-31 ENCOUNTER — Emergency Department (HOSPITAL_BASED_OUTPATIENT_CLINIC_OR_DEPARTMENT_OTHER): Admitting: Radiology

## 2024-03-31 ENCOUNTER — Emergency Department (HOSPITAL_BASED_OUTPATIENT_CLINIC_OR_DEPARTMENT_OTHER)
Admission: EM | Admit: 2024-03-31 | Discharge: 2024-03-31 | Disposition: A | Discharge status: Home / Self Care | Attending: Emergency Medicine | Admitting: Emergency Medicine

## 2024-03-31 ENCOUNTER — Encounter (HOSPITAL_BASED_OUTPATIENT_CLINIC_OR_DEPARTMENT_OTHER): Payer: Self-pay

## 2024-03-31 ENCOUNTER — Other Ambulatory Visit: Payer: Self-pay

## 2024-03-31 DIAGNOSIS — Z7982 Long term (current) use of aspirin: Secondary | ICD-10-CM | POA: Diagnosis not present

## 2024-03-31 DIAGNOSIS — R079 Chest pain, unspecified: Secondary | ICD-10-CM | POA: Diagnosis present

## 2024-03-31 DIAGNOSIS — R0789 Other chest pain: Secondary | ICD-10-CM | POA: Diagnosis not present

## 2024-03-31 LAB — BASIC METABOLIC PANEL WITH GFR
Anion gap: 11 (ref 5–15)
BUN: 18 mg/dL (ref 8–23)
CO2: 30 mmol/L (ref 22–32)
Calcium: 10.5 mg/dL — ABNORMAL HIGH (ref 8.9–10.3)
Chloride: 99 mmol/L (ref 98–111)
Creatinine, Ser: 0.99 mg/dL (ref 0.61–1.24)
GFR, Estimated: >60 mL/min
Glucose, Bld: 138 mg/dL — ABNORMAL HIGH (ref 70–99)
Potassium: 3.6 mmol/L (ref 3.5–5.1)
Sodium: 140 mmol/L (ref 135–145)

## 2024-03-31 LAB — CBC
HCT: 46.1 % (ref 39.0–52.0)
Hemoglobin: 15.9 g/dL (ref 13.0–17.0)
MCH: 31.1 pg (ref 26.0–34.0)
MCHC: 34.5 g/dL (ref 30.0–36.0)
MCV: 90.2 fL (ref 80.0–100.0)
Platelets: 179 K/uL (ref 150–400)
RBC: 5.11 MIL/uL (ref 4.22–5.81)
RDW: 13.2 % (ref 11.5–15.5)
WBC: 7.2 K/uL (ref 4.0–10.5)
nRBC: 0 % (ref 0.0–0.2)

## 2024-03-31 LAB — TROPONIN T, HIGH SENSITIVITY
Troponin T High Sensitivity: 22 ng/L — ABNORMAL HIGH (ref 0–19)
Troponin T High Sensitivity: 21 ng/L — ABNORMAL HIGH (ref 0–19)

## 2024-03-31 NOTE — Discharge Instructions (Signed)
 As discussed, workup today did not show anything acutely going on with your heart, lungs, or any other concerning findings today.  But it is still like you to follow-up with the cardiologist within next week to 2 weeks as well as your primary care.  Return to the emergency room if you have any worsening symptoms.

## 2024-03-31 NOTE — ED Notes (Signed)
 Reviewed discharge instructions and follow-up care with pt. Pt verbalized understanding and had no further questions. Pt exited ED without complications.

## 2024-03-31 NOTE — ED Triage Notes (Signed)
 Pt c/o pain just under L ribs, sometimes a dull ache, sometimes throbbing. States pain has sometimes been in L neck but not as much as under ribs.  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

## 2024-03-31 NOTE — ED Provider Notes (Signed)
 " [place] [facility]ERGENC[facility] DEPARTMENT AT [facility] Provider Note   CSN: [id number] Arrival date & time: 03/31/24  1627     Patient presents with: Chest Pain   TARRELL DEBES is a 69 y.o. male who presents with pain in the left lower chest for approximately 2 days.  Previous history of aortic valve replacement, 1 year prior.  He also endorses pain that radiates into the left side of the neck and into the left jaw.  Denies any dyspnea along with pain, states that when he was having a bowel movement last night, during extensive straining he has also felt a sensation of pulsing in the left upper abdomen.    Chest Pain      Prior to Admission medications  Medication Sig Start Date End Date Taking? Authorizing Provider  acetaminophen  (TYLENOL ) 500 MG tablet Take 1-2 tablets (500-1,000 mg total) by mouth every 6 (six) hours as needed. 05/16/23   Barrett, Erin R, PA-C  amLODipine  (NORVASC ) 5 MG tablet Take 2 tablets (10 mg total) by mouth daily. 11/26/23   Kate Lonni CROME, MD  aspirin  EC 81 MG tablet Take 81 mg by mouth daily. Swallow whole.    [provider]  Biotin 1000 MCG tablet Take 1,000 mcg by mouth daily.    [provider]  Blood Pressure Monitor MISC Check blood pressure once daily 11/10/23   Kate Lonni CROME, MD  Boswellia-Glucosamine-Vit D (GLUCOSAMINE COMPLEX -BOSWELLIA PO) Take 1,500 mg by mouth daily.    [provider]  chlorthalidone  (HYGROTON ) 25 MG tablet Take 1 tablet (25 mg total) by mouth daily. 12/16/23 03/15/24  Kate Lonni CROME, MD  Cholecalciferol (VITAMIN D -1000 MAX ST) 25 MCG (1000 UT) tablet Take 1,000 Units by mouth daily.    [provider]  Coenzyme Q10 (CO Q 10) 100 MG CAPS Take 100 mg by mouth daily.    [provider]  ezetimibe  (ZETIA ) 10 MG tablet TAKE ONE TABLET BY MOUTH ONE TIME DAILY 04/21/23   Kate Lonni CROME, MD  fluticasone  (FLONASE ) 50 MCG/ACT nasal spray Place 1 spray into  both nostrils at bedtime.    [provider]  Garlic 1000 MG CAPS Take 1,000 mg by mouth daily.    [provider]  Multiple Vitamins-Minerals (MULTIVITAMIN WITH MINERALS) tablet Take 1 tablet by mouth daily. Men    [provider]  olmesartan  (BENICAR ) 40 MG tablet Take 1 tablet (40 mg total) by mouth daily. 12/16/23   Kate Lonni CROME, MD  Omega-3 Fatty Acids (OMEGA 3 PO) Take 1,500 mg by mouth daily.    [provider]  rosuvastatin  (CRESTOR ) 40 MG tablet TAKE ONE TABLET BY MOUTH ONE TIME DAILY 04/21/23   Kate Lonni CROME, MD  TRINTELLIX  20 MG TABS tablet TAKE ONE TABLET BY MOUTH DAILY 12/13/23   Mozingo, Regina Nattalie, NP    Allergies: Amoxicillin -pot clavulanate    Review of Systems  Cardiovascular:  Positive for chest pain.  All other systems reviewed and are negative.   Updated Vital Signs BP 123/79   Pulse (!) 50   Temp 97.9 F (36.6 C)   Resp 10   SpO2 99%   Physical Exam Vitals and nursing note reviewed.  Constitutional:      General: He is not in acute distress.    Appearance: Normal appearance.  HENT:     Head: Normocephalic and atraumatic.     Mouth/Throat:     Mouth: Mucous membranes are moist.  Pharynx: Oropharynx is clear.  Eyes:     Extraocular Movements: Extraocular movements intact.     Conjunctiva/sclera: Conjunctivae normal.     Pupils: Pupils are equal, round, and reactive to light.  Cardiovascular:     Rate and Rhythm: Normal rate and regular rhythm.     Pulses: Normal pulses.          Carotid pulses are 2+ on the right side and 2+ on the left side.      Radial pulses are 2+ on the right side and 2+ on the left side.     Heart sounds: S1 normal and S2 normal. Murmur heard.     Systolic murmur is present with a grade of 2/6.     No friction rub. No gallop.     Comments: Noted systolic murmur at right upper sternal border radiating into bilateral carotids.  Does have history of aortic valve  replacement. Pulmonary:     Effort: Pulmonary effort is normal.     Breath sounds: Normal breath sounds.  Abdominal:     General: Abdomen is flat. Bowel sounds are normal.     Palpations: Abdomen is soft.  Musculoskeletal:        General: Normal range of motion.     Cervical back: Normal range of motion and neck supple.     Right lower leg: No edema.     Left lower leg: No edema.  Skin:    General: Skin is warm and dry.     Capillary Refill: Capillary refill takes less than 2 seconds.  Neurological:     General: No focal deficit present.     Mental Status: He is alert. Mental status is at baseline.  Psychiatric:        Mood and Affect: Mood normal.     (all labs ordered are listed, but only abnormal results are displayed) Labs Reviewed  BASIC METABOLIC PANEL WITH GFR - Abnormal; Notable for the following components:      Result Value   Glucose, Bld 138 (*)    Calcium  10.5 (*)    All other components within normal limits  TROPONIN T, HIGH SENSITIVITY - Abnormal; Notable for the following components:   Troponin T High Sensitivity 22 (*)    All other components within normal limits  TROPONIN T, HIGH SENSITIVITY - Abnormal; Notable for the following components:   Troponin T High Sensitivity 21 (*)    All other components within normal limits  CBC    EKG: EKG Interpretation Date/Time:  Friday March 31 2024 16:36:07 EST Ventricular Rate:  65 PR Interval:  55 QRS Duration:  96 QT Interval:  436 QTC Calculation: 454 R Axis:   14  Text Interpretation: Sinus rhythm Short PR interval Consider anterior infarct Nonspecific repol abnormality, lateral leads Baseline wander in lead(s) V2 No significant change since last tracing Confirmed by Emil Share [phone] on 03/31/2024 5:21:07 PM  Radiology: DG Chest 2 View Result Date: 03/31/2024 EXAM: 2 VIEW(S) XRAY OF THE CHEST 03/31/2024 04:49:00 PM COMPARISON: None available. CLINICAL HISTORY: cp cp cp cp cp FINDINGS: LUNGS AND PLEURA:  Left basilar atelectasis. No pleural effusion. No pneumothorax. HEART AND MEDIASTINUM: Median sternotomy and aortic valve replacement noted. BONES AND SOFT TISSUES: Thoracic degenerative changes. IMPRESSION: 1. No acute cardiopulmonary abnormality. 2. Left basilar atelectasis. Electronically signed by: Norleen Boxer MD 03/31/2024 05:27 PM EST RP Workstation: [id number]     Procedures   Medications Ordered in the ED - No data to display  Medical Decision Making Amount and/or Complexity of Data Reviewed Labs: ordered. Radiology: ordered.   Medical Decision Making:   ADALID BECKMANN is a 69 y.o. male who presented to the ED today with left-sided chest discomfort detailed above.     Complete initial physical exam performed, notably the patient  was alert and oriented in no apparent distress.  Physical exam is largely unremarkable, there is some tenderness to the left chest along the midclavicular line..    Reviewed and confirmed nursing documentation for past medical history, family history, social history.    Initial Assessment:   With the patient's presentation of chest discomfort, differential diagnosis includes possible ACS/STEMI, pneumothorax, pulmonary bolus, aortic aneurysm, GERD, pancreatitis.   Initial Plan:   Screening labs including CBC and Metabolic panel to evaluate for infectious or metabolic etiology of disease.  CXR to evaluate for structural/infectious intrathoracic pathology.  EKG and serial troponin to evaluate for cardiac pathology. Objective evaluation as below reviewed   Initial Study Results:   Laboratory  All laboratory results reviewed without evidence of clinically relevant pathology.   Exceptions include: Troponin elevated initially at 22, repeat at 2-hour interval is 21.  EKG EKG was reviewed independently. Rate, rhythm, axis, intervals all examined and without medically relevant abnormality. ST segments without concerns  for elevations.    Radiology:  All images reviewed independently. Agree with radiology report at this time.   DG Chest 2 View Result Date: 03/31/2024 EXAM: 2 VIEW(S) XRAY OF THE CHEST 03/31/2024 04:49:00 PM COMPARISON: None available. CLINICAL HISTORY: cp cp cp cp cp FINDINGS: LUNGS AND PLEURA: Left basilar atelectasis. No pleural effusion. No pneumothorax. HEART AND MEDIASTINUM: Median sternotomy and aortic valve replacement noted. BONES AND SOFT TISSUES: Thoracic degenerative changes. IMPRESSION: 1. No acute cardiopulmonary abnormality. 2. Left basilar atelectasis. Electronically signed by: Norleen Boxer MD 03/31/2024 05:27 PM EST RP Workstation: [id number]     Reassessment and Plan:   Reevaluation shows that there is no pertinent findings on the workup, chest x-ray shows some basilar atelectasis but in comparison to previous x-ray does not seem to be acute.  Troponin does not elevate over a 2-hour interval.  Remainder of the physical exam did not show any concerning findings.  Given this, this is likely atypical chest pain/chest wall discomfort, I have him continue to take over-the-counter analgesics as needed and follow-up with cardiology as well as primary care for further evaluation and management, careful return precautions have been given which she verbalizes understanding and agreement, thus we will discharge this patient with outpatient management as discussed.       Final diagnoses:  Atypical chest pain    ED Discharge Orders     None          Myriam Dorn BROCKS, PA 03/31/24 1955    Emil Share, DO 03/31/24 1958  "

## 2024-04-03 ENCOUNTER — Telehealth: Payer: Self-pay | Admitting: Cardiology

## 2024-04-03 ENCOUNTER — Telehealth: Payer: Self-pay | Admitting: Adult Health

## 2024-04-03 NOTE — Telephone Encounter (Signed)
 Pt came in to get more samples of Trintellix . Still waiting on confirmation of patient assitance application.

## 2024-04-03 NOTE — Telephone Encounter (Signed)
 Pt needs hospital f/u and only wants to see Dr. Kate. Please advise.

## 2024-04-06 ENCOUNTER — Other Ambulatory Visit (HOSPITAL_COMMUNITY): Payer: Self-pay | Admitting: Internal Medicine

## 2024-04-06 DIAGNOSIS — R079 Chest pain, unspecified: Secondary | ICD-10-CM

## 2024-04-06 DIAGNOSIS — Z952 Presence of prosthetic heart valve: Secondary | ICD-10-CM

## 2024-04-10 ENCOUNTER — Ambulatory Visit: Admitting: Cardiology

## 2024-04-12 ENCOUNTER — Other Ambulatory Visit: Payer: Self-pay | Admitting: Cardiology

## 2024-04-12 NOTE — Progress Notes (Addendum)
 " Cardiology Office Note   Date: 04/13/2024  ID:  Thomas [name] Oct 27, 1955 [id number] PCP: Charlott Dorn LABOR, MD  [facility] Providers Cardiologist: Lonni LITTIE Nanas, MD     Chief Complaint: Thomas [name] is a 69 y.o.male with PMH of mild nonobstructive CAD on cath in 10/2022, hypertension, hyperlipidemia, severe AS s/p AV replacement 04/2023, OSA on CPAP, obesity who presents to the clinic for evaluation of chest pain.    Mr. Coppolino was initially evaluated in 2020 for chest pain.  Coronary CTA showed calcium  score 113 (87th percentile).  FFR did not show hemodynamically significant stenosis.  Echo showed LVEF 55-60%, moderate LVH, G1DD, mild to moderate AS.  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  Monitor showed predominantly sinus rhythm with average HR of 65 bpm, 1 episode of VT lasting 4 beats, 6.3% PVC burden with one triggered event.  He was started on metoprolol  but had extreme fatigue and was switched to diltiazem .  Continued to have fatigue, sleep study ordered but the patient initially declined. However, this was eventually completed in and showed severe OSA.  Surveillance echo 2023 showed moderate AS.  Follow-up echo 2024 showed moderate to severe AS, he was asymptomatic. Six-month follow-up echo showed severe AS and he was referred for surgical workup.    Had bradycardia and wore a monitor 05/2022 that showed predominantly sinus rhythm with average HR 66 bpm, minimum HR 45 bpm, 7.7% PVC burden.  During workup for AS, cardiac catheterization 10/19/2022 showed mild nonobstructive CAD of the mid-LAD.  Ultimately underwent surgical aortic valve replacement with a bioprosthetic valve on 05/11/2023.  Postoperative course complicated by junctional rhythm and need for temporary pacing.  His rhythm returned and he was discharged 05/16/2023.    Since discharge he was noted to have elevated blood pressures.  He was referred to pharmD for medication titration.   Last visit 01/05/2024 his blood pressure was well-controlled on olmesartan , chlorthalidone , and amlodipine .  He presented to the ER 03/31/2024 with chest pain for 2 days.  Troponin 22>21.  EKG without acute ischemic changes.  Chest x-ray unremarkable.  Symptoms felt to be musculoskeletal in nature, discharged with cardiology follow-up.    History of Present Illness: Today he is less concerned that his symptoms are cardiac in nature. Relieved to hear that his ER work-up was unremarkable. He tells me that he had very short episodes of throbbing pain in his left chest for three days prior to his ER presentation. He bent down to tie his shoe and felt a pop under his left rib that felt like his heartbeat and this concerned him that it could be an issue with his valve. Episodes self-resolved. This has not recurred since his hospitalization. He works part-time at [employer]. He is able to walk 5,000-8,000 steps per day and lift since then without chest pain or dyspnea. His BP at home is similar to today's reading. He continues to wear his CPAP nightly.  ROS: Denies lower extremity edema, palpitations, lightheadedness, dizziness, syncope.   Studies Reviewed: The following studies were reviewed today: Cardiac Studies & Procedures   ______________________________________________________________________________________________ CARDIAC CATHETERIZATION  CARDIAC CATHETERIZATION 10/19/2022  Conclusion   Mid LAD lesion is 20% stenosed.  Mild non-obstructive CAD Normal right and left heart pressures (RA 5, RV 30/5/9, PA 32/14 mean 22, PCWP 8) Severe aortic stenosis by echo (mean gradient by cath is 28.5 mmHg).  Recommendations: Continue planning for SAVR vs TAVR. Medical management of mild  CAD  Findings Coronary Findings Diagnostic  Dominance: Left  Left Anterior Descending Vessel is large. Mid LAD lesion is 20% stenosed.  Left Circumflex Vessel is large.  Right Coronary  Artery Vessel is moderate in size.  Intervention  No interventions have been documented.     ECHOCARDIOGRAM  ECHOCARDIOGRAM COMPLETE 06/22/2023  Narrative ECHOCARDIOGRAM REPORT    Patient Name:   Thomas [name] Date of Exam: 06/22/2023 Medical Rec #:  [id number]       Height:       71.0 in Accession #:    [id number]      Weight:       270.8 lb Date of Birth:  1955-04-03        BSA:          2.398 m Patient Age:    68 years        BP:           128/82 mmHg Patient Gender: M               HR:           61 bpm. Exam Location:  [address]  Procedure: 2D Echo, Cardiac Doppler and Color Doppler (Both Spectral and Color Flow Doppler were utilized during procedure).  Indications:    Z95.2 Aortic Valve Replacement  History:        Patient has prior history of Echocardiogram examinations, most recent 10/28/2022. Aortic Valve Replacement-23 mm Inspiris Resilia Valve, Arrythmias:PVC, Signs/Symptoms:Murmur, Fatigue and Shortness of Breath; Risk Factors:Hypertension and Dyslipidemia. Aortic Valve: 23 mm bioprosthetic valve is present in the aortic position. Procedure Date: 05/11/2023.  Sonographer:    Carl Rodgers-Jones RDCS Referring Phys: [id number] CHRISTOPHER L SCHUMANN  IMPRESSIONS   1. Left ventricular ejection fraction, by estimation, is 55 to 60%. The left ventricle has normal function. The left ventricle has no regional wall motion abnormalities. There is mild concentric left ventricular hypertrophy. Left ventricular diastolic parameters were normal. 2. Right ventricular systolic function is normal. The right ventricular size is normal. There is normal pulmonary artery systolic pressure. The estimated right ventricular systolic pressure is 32.4 mmHg. 3. The mitral valve is grossly normal. Trivial mitral valve regurgitation. No evidence of mitral stenosis. 4. 23 mm Edwards inspiris resilia valve 05/11/2023. The aortic valve has been repaired/replaced. Aortic valve regurgitation  is not visualized. No aortic stenosis is present. There is a 23 mm bioprosthetic valve present in the aortic position. Procedure Date: 05/11/2023. Echo findings are consistent with normal structure and function of the aortic valve prosthesis. Aortic valve area, by VTI measures 1.41 cm. Aortic valve mean gradient measures 17.0 mmHg. Aortic valve Vmax measures 2.67 m/s. 5. The inferior vena cava is normal in size with greater than 50% respiratory variability, suggesting right atrial pressure of 3 mmHg.  Comparison(s): Changes from prior study are noted. AoV has been replaced.  FINDINGS Left Ventricle: Left ventricular ejection fraction, by estimation, is 55 to 60%. The left ventricle has normal function. The left ventricle has no regional wall motion abnormalities. The left ventricular internal cavity size was normal in size. There is mild concentric left ventricular hypertrophy. Abnormal (paradoxical) septal motion consistent with post-operative status. Left ventricular diastolic parameters were normal.  Right Ventricle: The right ventricular size is normal. No increase in right ventricular wall thickness. Right ventricular systolic function is normal. There is normal pulmonary artery systolic pressure. The tricuspid regurgitant velocity is 2.71 m/s, and with an assumed right atrial pressure of 3 mmHg, the estimated right  ventricular systolic pressure is 32.4 mmHg.  Left Atrium: Left atrial size was normal in size.  Right Atrium: Right atrial size was normal in size.  Pericardium: There is no evidence of pericardial effusion.  Mitral Valve: The mitral valve is grossly normal. Trivial mitral valve regurgitation. No evidence of mitral valve stenosis.  Tricuspid Valve: The tricuspid valve is grossly normal. Tricuspid valve regurgitation is mild . No evidence of tricuspid stenosis.  Aortic Valve: 23 mm Edwards inspiris resilia valve 05/11/2023. The aortic valve has been repaired/replaced. Aortic  valve regurgitation is not visualized. No aortic stenosis is present. Aortic valve mean gradient measures 17.0 mmHg. Aortic valve peak gradient measures 28.5 mmHg. Aortic valve area, by VTI measures 1.41 cm. There is a 23 mm bioprosthetic valve present in the aortic position. Procedure Date: 05/11/2023. Echo findings are consistent with normal structure and function of the aortic valve prosthesis.  Pulmonic Valve: The pulmonic valve was grossly normal. Pulmonic valve regurgitation is not visualized. No evidence of pulmonic stenosis.  Aorta: The aortic root and ascending aorta are structurally normal, with no evidence of dilitation.  Venous: The right lower pulmonary vein is normal. The inferior vena cava is normal in size with greater than 50% respiratory variability, suggesting right atrial pressure of 3 mmHg.  IAS/Shunts: The atrial septum is grossly normal.   LEFT VENTRICLE PLAX 2D LVIDd:         5.10 cm   Diastology LVIDs:         3.40 cm   LV e' medial:    7.24 cm/s LV PW:         1.20 cm   LV E/e' medial:  12.3 LV IVS:        1.10 cm   LV e' lateral:   11.10 cm/s LVOT diam:     2.28 cm   LV E/e' lateral: 8.0 LV SV:         81 LV SV Index:   34 LVOT Area:     4.08 cm   RIGHT VENTRICLE             IVC RV Basal diam:  3.90 cm     IVC diam: 1.90 cm RV S prime:     13.15 cm/s TAPSE (M-mode): 1.8 cm  LEFT ATRIUM             Index        RIGHT ATRIUM           Index LA diam:        5.70 cm 2.38 cm/m   RA Area:     16.80 cm LA Vol (A2C):   52.7 ml 21.97 ml/m  RA Volume:   46.10 ml  19.22 ml/m LA Vol (A4C):   63.9 ml 26.64 ml/m LA Biplane Vol: 61.6 ml 25.68 ml/m AORTIC VALVE AV Area (Vmax):    1.36 cm AV Area (Vmean):   1.55 cm AV Area (VTI):     1.41 cm AV Vmax:           267.00 cm/s AV Vmean:          152.600 cm/s AV VTI:            0.576 m AV Peak Grad:      28.5 mmHg AV Mean Grad:      17.0 mmHg LVOT Vmax:         88.85 cm/s LVOT Vmean:        57.750 cm/s LVOT  VTI:  0.199 m LVOT/AV VTI ratio: 0.35  AORTA Ao Root diam: 3.70 cm Ao Asc diam:  3.70 cm  MITRAL VALVE               TRICUSPID VALVE MV Area (PHT): 3.36 cm    TR Peak grad:   29.4 mmHg MV Decel Time: 226 msec    TR Vmax:        271.00 cm/s MV E velocity: 89.35 cm/s MV A velocity: 89.35 cm/s  SHUNTS MV E/A ratio:  1.00        Systemic VTI:  0.20 m Systemic Diam: 2.28 cm  Darryle Decent MD Electronically signed by Darryle Decent MD Signature Date/Time: 06/22/2023/11:53:27 AM    Final   TEE  ECHO INTRAOPERATIVE TEE 05/11/2023  Narrative *INTRAOPERATIVE TRANSESOPHAGEAL REPORT *    Patient Name:   DEMICHAEL TRAUM Stetler Date of Exam: 05/11/2023 Medical Rec #:  [id number]       Height:       71.0 in Accession #:    [id number]      Weight:       278.0 lb Date of Birth:  1955-07-15        BSA:          2.43 m Patient Age:    68 years        BP: Patient Gender: M               HR: Exam Location:  Anesthesiology  Transesophogeal exam was perform intraoperatively during surgical procedure. Patient was closely monitored under general anesthesia during the entirety of examination.  Indications:     AVR Performing Phys: Thom Peoples  Complications: No known complications during this procedure. POST-OP IMPRESSIONS _ Left Ventricle: The left ventriclar function is normal following CPB. _ Right Ventricle: The right ventriclar function is normal following CPB. _ Aorta: The aorta appears unchanged from pre-bypass. There is no dissection. _ Aortic Valve: There is a 23mm Inspiris valve in the aortic position. The valve is well seated with no paravalvular leak. Mean PG . _ Mitral Valve: The mitral valve appears unchanged from pre-bypass. Trace MR. There is chordal SAM. _ Tricuspid Valve: The tricuspid valve appears unchanged from pre-bypass. Mild TR. _ Pulmonic Valve: The pulmonic valve appears unchanged from pre-bypass. _ Pericardium: The pericardium appears unchanged from  pre-bypass.  PRE-OP FINDINGS Left Ventricle: The left ventricle has normal systolic function, with an ejection fraction of 55-60%. The cavity size was normal. There is moderate left ventricular hypertrophy.   Right Ventricle: The right ventricle has normal systolic function. The cavity was normal. There is no increase in right ventricular wall thickness.  Left Atrium: No left atrial/left atrial appendage thrombus was detected.  Interatrial Septum: No atrial level shunt detected by color flow Doppler. There is no evidence of a patent foramen ovale.  Pericardium: There is no evidence of pericardial effusion. There is no pleural effusion.  Mitral Valve: The mitral valve is normal in structure. Mitral valve regurgitation is trivial by color flow Doppler. There is chordal SAM.  Tricuspid Valve: The tricuspid valve was normal in structure. Tricuspid valve regurgitation is trivial by color flow Doppler.  Aortic Valve: The aortic valve is tricuspid. The leaflets are heavily calcified with reduced excursion. There is severe aortic stenosis. AVA 0.96cm^2. There is trace aortic insufficiency.   Pulmonic Valve: The pulmonic valve was normal in structure. Pulmonic valve regurgitation is trivial by color flow Doppler.   Aorta: The aortic root, ascending aorta and aortic arch  are normal in size and structure. There is no dissect.   Thom Peoples Electronically signed by Thom Peoples Signature Date/Time: 05/11/2023/3:41:11 PM    Final  MONITORS  LONG TERM MONITOR (3-14 DAYS) 06/10/2022  Narrative   Frequent PVCs (7.7%)   Patch Wear Time:  6 days and 18 hours (2024-03-14T13:19:39-0400 to 2024-03-21T07:36:45-0400)  Patient had a min HR of 45 bpm, max HR of 154 bpm, and avg HR of 66 bpm. Predominant underlying rhythm was Sinus Rhythm. Intermittent Bundle Branch Block was present. 3 Supraventricular Tachycardia runs occurred, the run with the fastest interval lasting 6 beats with a max rate of 154  bpm, the longest lasting 5 beats with an avg rate of 113 bpm. Isolated SVEs were rare (<1.0%), SVE Couplets were rare (<1.0%), and SVE Triplets were rare (<1.0%). Isolated VEs were frequent (7.7%, 48429), VE Couplets were rare (<1.0%, 64), and VE Triplets were rare (<1.0%, 1). Ventricular Bigeminy and Trigeminy were present.   CT SCANS  CT CORONARY MORPH W/CTA COR W/SCORE 10/27/2022  Addendum 10/27/2022  3:03 PM ADDENDUM REPORT: 10/27/2022 15:00  CLINICAL DATA:  Aortic Valve pathology with assessment for TAVR  EXAM: Cardiac TAVR CT  TECHNIQUE: The patient was scanned on a Siemens Force 192 slice scanner. A 120 kV retrospective scan was triggered in the descending thoracic aorta at 111 HU's. Gantry rotation speed was 270 msecs and collimation was .9 mm. No beta blockade or nitro were given. The 3D data set was reconstructed in 5% intervals of the R-R cycle. Systolic and diastolic phases were analyzed on a dedicated work station using MPR, MIP and VRT modes. The patient received 100 cc of contrast.  FINDINGS: Aortic Valve: Severely thickened aortic valve, functionally bicuspid with a small left-right raphe with calcification and reduced excursion  Annular calcification: None  Aortic Valve Calcium  Score: 1702  Presence of basal septal hypertrophy: No  Perimembranous septal diameter: 8 mm  Mitral Valve: No calcifications.  Aortic Annulus Measurements- 30% phase  Major annulus diameter: 28 mm  Minor annulus diameter: 23 mm  Annular perimeter: 80 mm  Annular area: 4.69 cm2  Aortic Root Measurements- 74%  Sinotubular Junction: 31 mm  Ascending Thoracic Aorta: 34 mm  Aortic Arch: 32 mm  Descending Thoracic Aorta: 26 mm  Aortic atherosclerosis.  Sinus of Valsalva Measurements:  Right coronary cusp width: 29 mm  Left coronary cusp width: 30 mm  Non coronary cusp width: 31 mm  Coronary Artery Height above Annulus:  Left Main: 14 mm  Left SoV height: 22  mm  Right Coronary: 15 mm  Right SoV height: 22 mm  Optimum Fluoroscopic Angle for Delivery: LAO 7, CAU 6  Cusp overlay view angle: RAO 0, CAU 15  Valves for structural team consideration: 26 mm Sapien vs a 29 mm Evolut Valve  Non TAVR Valve Findings:  Coronary Arteries: Normal coronary origin- very short left main. Study not completed with nitroglycerin .  Coronary Calcium  Score:  Left main: 0  Left anterior descending artery: 112  Left circumflex artery: 222  Right coronary artery:0  Total: 334  Percentile: 74th for age, sex, and race matched control.  Systemic veins: Normal anatomy  Main Pulmonary artery: Normal caliber  Pulmonary veins: Normal variant anatomy, right middle pulmonary vein  Left atrial appendage: Patent  Interatrial septum: No clear communications  Left ventricle: Normal size  Right ventricle: Normal size  Left atrium: Dilated  Right atrium: Dilated  Pericardium: No calcifications  Extra Cardiac Findings as per separate reporting.  Notable  artifacts: Mild body attenuation, systolic phase motion artifact  IMPRESSION: 1. Moderate to severe aortic stenosis. Findings pertinent to TAVR procedure are detailed above.  RECOMMENDATIONS:  The proposed cut-off value of 1,651 AU yielded a 93 % sensitivity and 75 % specificity in grading AS severity in patients with classical low-flow, low-gradient AS. Proposed different cut-off values to define severe AS for men and women as 2,065 AU and 1,274 AU, respectively. The joint European and American recommendations for the assessment of AS consider the aortic valve calcium  score as a continuum - a very high calcium  score suggests severe AS and a low calcium  score suggests severe AS is unlikely.  Donney VEAR Jarome LULLA Stephen RENETTE, et al. 2017 ESC/EACTS Guidelines for the management of valvular heart disease. Eur Heart J [phone]  Coronary artery calcium  (CAC) score is a strong predictor  of incident coronary heart disease (CHD) and provides predictive information beyond traditional risk factors. CAC scoring is reasonable to use in the decision to withhold, postpone, or initiate statin therapy in intermediate-risk or selected borderline-risk asymptomatic adults (age 55-75 years and LDL-C >=70 to <190 mg/dL) who do not have diabetes or established atherosclerotic cardiovascular disease (ASCVD).* In intermediate-risk (10-year ASCVD risk >=7.5% to <20%) adults or selected borderline-risk (10-year ASCVD risk >=5% to <7.5%) adults in whom a CAC score is measured for the purpose of making a treatment decision the following recommendations have been made:  If CAC = 0, it is reasonable to withhold statin therapy and reassess in 5 to 10 years, as long as higher risk conditions are absent (diabetes mellitus, family history of premature CHD in first degree relatives (males <55 years; females <65 years), cigarette smoking, LDL >=190 mg/dL or other independent risk factors).  If CAC is 1 to 99, it is reasonable to initiate statin therapy for patients >=35 years of age.  If CAC is >=100 or >=75th percentile, it is reasonable to initiate statin therapy at any age.  Cardiology referral should be considered for patients with CAC scores >=400 or >=75th percentile.  *2018 AHA/ACC/AACVPR/AAPA/ABC/ACPM/ADA/AGS/APhA/ASPC/NLA/PCNA Guideline on the Management of Blood Cholesterol: A Report of the American College of Cardiology/American Heart Association Task Force on Clinical Practice Guidelines. J Am Coll Cardiol. 2019;73(24):3168-3209.  Mahesh  Chandrasekhar   Electronically Signed By: Stanly Leavens M.D. On: 10/27/2022 15:00  Narrative EXAM: OVER-READ INTERPRETATION  CT CHEST  The following report is a limited chest CT over-read performed by radiologist Dr. Rea Marc of [facility] on 10/27/2022. This over-read does not include interpretation of  cardiac or coronary anatomy or pathology. The cardiac TAVR interpretation by the cardiologist is attached.  COMPARISON:  None Available.  FINDINGS: Extracardiac findings will be described separately under dictation for contemporaneously obtained CTA chest, abdomen and pelvis.  IMPRESSION: Please see separate dictation for contemporaneously obtained CTA chest, abdomen and pelvis dated 10/27/2022 for full description of relevant extracardiac findings.  Electronically Signed: By: Rea Marc M.D. On: 10/27/2022 12:41   CT SCANS  CT CORONARY FRACTIONAL FLOW RESERVE DATA PREP 02/23/2019  Narrative EXAM: CT FFR ANALYSIS  CLINICAL DATA:  69 year old male with chest pain.  FINDINGS: FFRct analysis was performed on the original cardiac CT angiogram dataset. Diagrammatic representation of the FFRct analysis is provided in a separate PDF document in PACS. This dictation was created using the PDF document and an interactive 3D model of the results. 3D model is not available in the EMR/PACS. Normal FFR range is >0.80.  1. No left main  artery. 2. LAD: Mid 0.87 3. LCX: Mid: 0.87, Distal: 0.77 4. RCA: 0.97  IMPRESSION: 1. CT FFR analysis didn't show any significant stenosis in the proximal to mid portions of the each vessel. The distal portion reports FFR 0.77 however this is a small short lesion. Therefore at this time aggressive medical therapy is recommended.   Electronically Signed By: Dub Huntsman MD On: 02/25/2019 22:35   CT CORONARY MORPH W/CTA COR W/SCORE 02/23/2019  Addendum 02/23/2019  9:09 PM ADDENDUM REPORT: 02/23/2019 21:07  CLINICAL DATA:  69 year old male with chest pain.  EXAM: Cardiac/Coronary  CT  TECHNIQUE: The patient was scanned on a [employer].  FINDINGS: A 120 kV prospective scan was triggered in the descending thoracic aorta at 111 HU's. Axial non-contrast 3 mm slices were carried out through the heart. The data set was  analyzed on a dedicated work station and scored using the Agatson method. Gantry rotation speed was 250 msecs and collimation was .6 mm. No beta blockade and 0.8 mg of sl NTG was given. The 3D data set was reconstructed in 5% intervals of the 67-82 % of the R-R cycle. Diastolic phases were analyzed on a dedicated work station using MPR, MIP and VRT modes. The patient received 80 cc of contrast.  Aorta: Normal size.  No calcifications.  No dissection.  Aortic Valve: Trileaflet with calcifications.  Coronary Arteries: There is no visible left main artery. The LAD and LCX have separate ostial from the aortic sinus. Left dominance.  RCA is a medium caliber vessel and does not supply the PDA. There is no plaque.  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 The LAD and LCX have separate ostial from the aortic sinus.  LAD is a large vessel that has a mid vessel 50-69% calcified plaque. Distal to the calcified plaque, there is a mid vessel minimal <25% soft/ non calcified plaque. D1 with no plaque.  LCX is a dominant artery that gives rise to one large OM1 branch. There is a proximal 25-49% calcified plaque. Mid LCX with minimal <25% calcified plaque. OM1 with no plaque.  Other findings:  Normal pulmonary vein drainage into the left atrium with no evidence of stenosis.  Normal let atrial appendage without a thrombus.  Normal size of the pulmonary artery.  IMPRESSION: 1. Coronary calcium  score of 113. This was 67 percentile for age and sex matched control.  2. No Left main artery present. LAD and LCX with two separate origin. Left Dominance.  3. Non-obstructive Coronary artery disease with the most significant lesion being moderate (50-69%). CAD-RADS 3. This study will be sent for FFR.  Kardie Tobb, DO   Electronically Signed By: Dub Huntsman MD On: 02/23/2019 21:07  Narrative EXAM: OVER-READ INTERPRETATION  CT CHEST  The following report is an over-read performed by  radiologist Dr. Rockey Kilts of [facility] on 02/23/2019. This over-read does not include interpretation of cardiac or coronary anatomy or pathology. The coronary CTA interpretation by the cardiologist is attached.  COMPARISON:  Chest radiograph 01/22/2019 no prior CT.  FINDINGS: Vascular: Normal aortic caliber, without evidence of dissection. No central pulmonary embolism, on this non-dedicated study.  Mediastinum/Nodes: No imaged thoracic adenopathy.  Lungs/Pleura: No pleural fluid.  Clear imaged lungs.  Upper Abdomen: Normal imaged portions of the liver, spleen, stomach.  Musculoskeletal: Midthoracic spondylosis.  IMPRESSION: No acute findings in the imaged extracardiac chest.  Electronically Signed: By: Rockey Kilts M.D. On: 02/23/2019 12:11     ______________________________________________________________________________________________  Physical Exam: VS: BP 128/64 (BP Location: Left Arm, Patient Position: Sitting, Cuff Size: Large)   Pulse 76   Ht 5' 10 (1.778 m)   Wt 280 lb 9.6 oz (127.3 kg)   SpO2 97%   BMI 40.26 kg/m   GEN: Well nourished, in NAD HEENT: Normal NECK: No JVD CARDIAC: RRR, 2/6 systolic murmur RESPIRATORY: Clear to auscultation bilaterally ABDOMEN: Soft, non-tender, non-distended MUSCULOSKELETAL: No edema SKIN: Warm and dry NEUROLOGIC:  Alert and oriented x 3 PSYCHIATRIC:  Pleasant, normal affect   Assessment & Plan: 1. Precordial pain/Severe aortic stenosis s/p AVR: Presented to the ER 03/31/2024 with very short episodes of throbbing pain in his left chest for three days. The episodes self-resolved. He felt a pop under his left rib when he bent down to tie his shoes and was concerned that this could be his valve. Works at [employer] and has been able to continue activities there without anginal symptoms. Low suspicion that his pain is cardiac in nature. He was reassured that he had very mild  CAD on [facility] 10/2022, his troponins were unrevealing, and his AV has very slight murmur. He has no HF symptoms. He already has an echocardiogram scheduled for 05/09/2024. - Check echo for surveillance of AV - this was ordered by his PCP but agree with plan  2. Hypertension: BP today 128/64, similar to home readings. - Continue amlodipine  10 mg daily - Continue chlorthalidone  25 mg daily - Continue olmesartan  40 mg daily  3. OSA: Reports compliance with CPAP. - Encouraged to continue  4. PVCs: EKG in ER did not show PVCs. He denies palpitations.  Dispo: Follow-up in 3 months with Dr. Kate to review echocardiogram results.  Signed, Saddie GORMAN Cleaves, NP 04/13/2024 11:29 AM [facility] "

## 2024-04-13 ENCOUNTER — Encounter: Payer: Self-pay | Admitting: Physician Assistant

## 2024-04-13 ENCOUNTER — Ambulatory Visit: Attending: Physician Assistant

## 2024-04-13 VITALS — BP 128/64 | HR 76 | Ht 70.0 in | Wt 280.6 lb

## 2024-04-13 DIAGNOSIS — G4733 Obstructive sleep apnea (adult) (pediatric): Secondary | ICD-10-CM | POA: Insufficient documentation

## 2024-04-13 DIAGNOSIS — R072 Precordial pain: Secondary | ICD-10-CM | POA: Diagnosis present

## 2024-04-13 DIAGNOSIS — Z952 Presence of prosthetic heart valve: Secondary | ICD-10-CM | POA: Insufficient documentation

## 2024-04-13 DIAGNOSIS — I10 Essential (primary) hypertension: Secondary | ICD-10-CM | POA: Insufficient documentation

## 2024-04-13 DIAGNOSIS — I493 Ventricular premature depolarization: Secondary | ICD-10-CM | POA: Insufficient documentation

## 2024-04-13 NOTE — Patient Instructions (Signed)
 Medication Instructions:  NO CHANGES *If you need a refill on your cardiac medications before your next appointment, please call your pharmacy*  Lab Work: NO LABS If you have labs (blood work) drawn today and your tests are completely normal, you will receive your results only by: MyChart Message (if you have MyChart) OR A paper copy in the mail If you have any lab test that is abnormal or we need to change your treatment, we will call you to review the results.  Testing/Procedures: NO TESTING  Follow-Up: At [facility], you and your health needs are our priority.  As part of our continuing mission to provide you with exceptional heart care, our providers are all part of one team.  This team includes your primary Cardiologist (physician) and Advanced Practice Providers or APPs (Physician Assistants and Nurse Practitioners) who all work together to provide you with the care you need, when you need it.  Your next appointment:   3 month(s)  Provider:   Lonni LITTIE Nanas, MD

## 2024-04-20 NOTE — Telephone Encounter (Signed)
 Completed DMV form faxed to [employer] and scanned into chart.  Billing notified.

## 2024-05-09 ENCOUNTER — Ambulatory Visit (HOSPITAL_COMMUNITY)

## 2024-07-18 ENCOUNTER — Ambulatory Visit: Admitting: Cardiology
